# Patient Record
Sex: Female | Born: 1955 | Race: White | Hispanic: No | Smoking: Current every day smoker
Health system: Southern US, Community
[De-identification: ages and names within clinical notes are randomized; demographics above are authoritative.]

## PROBLEM LIST (undated history)

## (undated) DIAGNOSIS — Z87442 Personal history of urinary calculi: Secondary | ICD-10-CM

## (undated) DIAGNOSIS — E039 Hypothyroidism, unspecified: Secondary | ICD-10-CM

## (undated) DIAGNOSIS — I10 Essential (primary) hypertension: Secondary | ICD-10-CM

## (undated) DIAGNOSIS — E119 Type 2 diabetes mellitus without complications: Secondary | ICD-10-CM

## (undated) DIAGNOSIS — K219 Gastro-esophageal reflux disease without esophagitis: Secondary | ICD-10-CM

## (undated) DIAGNOSIS — F419 Anxiety disorder, unspecified: Secondary | ICD-10-CM

## (undated) DIAGNOSIS — E079 Disorder of thyroid, unspecified: Secondary | ICD-10-CM

## (undated) DIAGNOSIS — J449 Chronic obstructive pulmonary disease, unspecified: Secondary | ICD-10-CM

## (undated) HISTORY — PX: TONSILLECTOMY: SUR1361

## (undated) HISTORY — PX: HEMATOMA EVACUATION: SHX5118

## (undated) HISTORY — PX: FINGER SURGERY: SHX640

## (undated) HISTORY — PX: ELBOW SURGERY: SHX618

## (undated) HISTORY — PX: KNEE SURGERY: SHX244

---

## 2009-06-13 ENCOUNTER — Emergency Department (HOSPITAL_COMMUNITY): Admission: EM | Admit: 2009-06-13 | Discharge: 2009-06-13 | Payer: Self-pay | Admitting: Emergency Medicine

## 2010-07-28 ENCOUNTER — Emergency Department (HOSPITAL_COMMUNITY)
Admission: EM | Admit: 2010-07-28 | Discharge: 2010-07-28 | Payer: Self-pay | Source: Home / Self Care | Admitting: Emergency Medicine

## 2016-08-05 ENCOUNTER — Encounter (HOSPITAL_COMMUNITY): Payer: Self-pay | Admitting: Emergency Medicine

## 2016-08-05 ENCOUNTER — Emergency Department (HOSPITAL_COMMUNITY)
Admission: EM | Admit: 2016-08-05 | Discharge: 2016-08-05 | Disposition: A | Discharge status: Home / Self Care | Payer: Medicaid Other | Attending: Emergency Medicine | Admitting: Emergency Medicine

## 2016-08-05 DIAGNOSIS — R202 Paresthesia of skin: Secondary | ICD-10-CM | POA: Diagnosis present

## 2016-08-05 DIAGNOSIS — I10 Essential (primary) hypertension: Secondary | ICD-10-CM | POA: Insufficient documentation

## 2016-08-05 DIAGNOSIS — G5603 Carpal tunnel syndrome, bilateral upper limbs: Secondary | ICD-10-CM | POA: Diagnosis not present

## 2016-08-05 DIAGNOSIS — F17210 Nicotine dependence, cigarettes, uncomplicated: Secondary | ICD-10-CM | POA: Insufficient documentation

## 2016-08-05 HISTORY — DX: Disorder of thyroid, unspecified: E07.9

## 2016-08-05 HISTORY — DX: Essential (primary) hypertension: I10

## 2016-08-05 MED ORDER — IBUPROFEN 800 MG PO TABS: 800.0000 mg | ORAL_TABLET | Freq: Three times a day (TID) | ORAL | 0 refills | Status: DC

## 2016-08-05 MED ORDER — IBUPROFEN 800 MG PO TABS
800.0000 mg | ORAL_TABLET | Freq: Once | ORAL | Status: AC
  Administered 2016-08-05: 800 mg via ORAL
  Filled 2016-08-05: qty 1

## 2016-08-05 MED ORDER — OXYCODONE-ACETAMINOPHEN 5-325 MG PO TABS
1.0000 | ORAL_TABLET | Freq: Once | ORAL | Status: AC
  Administered 2016-08-05: 1 via ORAL
  Filled 2016-08-05: qty 1

## 2016-08-05 MED ORDER — OXYCODONE-ACETAMINOPHEN 5-325 MG PO TABS: 1.0000 | ORAL_TABLET | ORAL | 0 refills | Status: DC | PRN

## 2016-08-05 NOTE — Discharge Instructions (Signed)
Wear the brace day and night for 1-2 weeks.  Call the orthopedic doctor listed to arrange a follow-up appt if not improving

## 2016-08-05 NOTE — ED Provider Notes (Signed)
[facility]PT Provider Note   CSN: [id number] Arrival date & time: 08/05/16  1339     History   Chief Complaint Chief Complaint  Patient presents with  . Hand Problem    HPI Kristin AreaBonnie G Attia is a 61 y.o. female.  HPI   Kristin [name] is a 61 y.o. female who presents to the Emergency Department complaining of numbness, pain and tingling of the bilateral hands and forearms.  Describes pain to the forearms with tingling sensation to all her fingers except the pinkie.  Symptoms began 2 weeks ago.  Numbness and pain are constant and interfering with sleep.  She states she is unable to grip objects with either hand, but pain to the left is worse,  She has tried warm water and ibuprofen without relief.      Past Medical History:  Diagnosis Date  . Hypertension   . Thyroid disease     There are no active problems to display for this patient.   Past Surgical History:  Procedure Laterality Date  . CESAREAN SECTION    . ELBOW SURGERY     right  . FINGER SURGERY     left pinky finger  . HEMATOMA EVACUATION     left hip  . KNEE SURGERY     right  . TONSILLECTOMY      OB History    Gravida Para Term Preterm AB Living   3         3   SAB TAB Ectopic Multiple Live Births                   Home Medications    Prior to Admission medications   Medication Sig Start Date End Date Taking? Authorizing Provider  ibuprofen (ADVIL,MOTRIN) 800 MG tablet Take 1 tablet (800 mg total) by mouth 3 (three) times daily. 08/05/16   Latravis Grine, PA-C  oxyCODONE-acetaminophen (PERCOCET/ROXICET) 5-325 MG tablet Take 1 tablet by mouth every 4 (four) hours as needed. 08/05/16   Delayne Sanzo, PA-C    Family History History reviewed. No pertinent family history.  Social History Social History  Substance Use Topics  . Smoking status: Current Every Day Smoker    Types: Cigarettes  . Smokeless tobacco: Never Used  . Alcohol use 3.6 oz/week    6 Cans of beer per week      Allergies   Patient has no known allergies.   Review of Systems Review of Systems  Constitutional: Negative for chills and fever.  Cardiovascular: Negative for chest pain.  Genitourinary: Negative for difficulty urinating and dysuria.  Musculoskeletal: Positive for arthralgias (bilateral hand pain). Negative for joint swelling.  Skin: Negative for color change, rash and wound.  Neurological: Positive for numbness. Negative for weakness.  All other systems reviewed and are negative.    Physical Exam Updated Vital Signs BP 150/80 (BP Location: Left Arm)   Pulse 80   Temp 97.7 F (36.5 C) (Oral)   Resp 16   Ht 5\' 3"  (1.6 m)   Wt 57.2 kg   SpO2 94%   BMI 22.32 kg/m   Physical Exam  Constitutional: She is oriented to person, place, and time. She appears well-developed and well-nourished. No distress.  HENT:  Head: Normocephalic and atraumatic.  Neck: Normal range of motion.  Cardiovascular: Normal rate, regular rhythm and intact distal pulses.   Pulmonary/Chest: Effort normal and breath sounds normal.  Musculoskeletal: She exhibits tenderness. She exhibits no edema.   Positive tinel's sign bilateral  hands.  Gross sensation intact to distal fingers. Tenderness to bilateral wrist. No edema, erythema,  Radial pulse brisk bilaterally.    Lymphadenopathy:    She has no cervical adenopathy.  Neurological: She is alert and oriented to person, place, and time. She exhibits normal muscle tone. Coordination normal.  Skin: Skin is warm and dry.  Nursing note and vitals reviewed.    ED Treatments / Results  Labs (all labs ordered are listed, but only abnormal results are displayed) Labs Reviewed - No data to display  EKG  EKG Interpretation None       Radiology No results found.  Procedures Procedures (including critical care time)  Medications Ordered in ED Medications  oxyCODONE-acetaminophen (PERCOCET/ROXICET) 5-325 MG per tablet 1 tablet (1 tablet Oral  Given 08/05/16 1459)  ibuprofen (ADVIL,MOTRIN) tablet 800 mg (800 mg Oral Given 08/05/16 1459)     Initial Impression / Assessment and Plan / ED Course  I have reviewed the triage vital signs and the nursing notes.  Pertinent labs & imaging results that were available during my care of the patient were reviewed by me and considered in my medical decision making (see chart for details).     Pt with likely bilateral carpel tunnel syndrome.  Sx's localized to hands.    Bilateral wrist splints applied, pain improved.  Pt agrees to tx plan which includes splint, NSAID, short course of pain medication, and close ortho f/u.    Final Clinical Impressions(s) / ED Diagnoses   Final diagnoses:  Bilateral carpal tunnel syndrome    New Prescriptions Discharge Medication List as of 08/05/2016  3:12 PM    START taking these medications   Details  ibuprofen (ADVIL,MOTRIN) 800 MG tablet Take 1 tablet (800 mg total) by mouth 3 (three) times daily., Starting Wed 08/05/2016, Print    oxyCODONE-acetaminophen (PERCOCET/ROXICET) 5-325 MG tablet Take 1 tablet by mouth every 4 (four) hours as needed., Starting Wed 08/05/2016, Print         Jaleil Renwick Mosquero, PA-C 08/07/16 2131    Marily Memos, MD 08/09/16 1210

## 2016-08-05 NOTE — ED Triage Notes (Signed)
C/o tingling to both hands and finger, rates pain 7/10.  Pt is able to move finger.

## 2016-08-31 ENCOUNTER — Encounter: Payer: Self-pay | Admitting: Orthopedic Surgery

## 2016-08-31 ENCOUNTER — Ambulatory Visit (INDEPENDENT_AMBULATORY_CARE_PROVIDER_SITE_OTHER): Payer: Medicaid Other | Admitting: Orthopedic Surgery

## 2016-08-31 VITALS — BP 148/86 | HR 72 | Ht 64.0 in | Wt 129.0 lb

## 2016-08-31 DIAGNOSIS — G5603 Carpal tunnel syndrome, bilateral upper limbs: Secondary | ICD-10-CM | POA: Diagnosis not present

## 2016-08-31 MED ORDER — GABAPENTIN 100 MG PO CAPS: 100.0000 mg | ORAL_CAPSULE | Freq: Every day | ORAL | 0 refills | Status: DC

## 2016-08-31 NOTE — Progress Notes (Signed)
Patient ID: Kristin [name], female   DOB: April 13, 1956, 61 y.o.   MRN: [id number]  Chief Complaint  Patient presents with  . Hand Problem    bilateral hands numb and tingling    HPI Kristin [name] is a 61 y.o. female.   61 year old female presents to [place] after going to the emergency room complaining of several month history of numbness and tingling in both hands with weakness and loss of grip. She has a history of thyroid disease she takes thyroid medication she also is a smoker and drinks about a sixpack per week. This is painful for her in the wrist [name] and she notices inability to hold onto things .  She has worn splints pretty much full-time for the last 4 weeks    Review of Systems Review of Systems  Constitutional: Negative for fever.  Respiratory: Positive for cough.   Cardiovascular: Negative for chest pain.  Musculoskeletal: Positive for arthralgias.  Neurological: Positive for weakness and numbness.     Past Medical History:  Diagnosis Date  . Hypertension   . Thyroid disease     Past Surgical History:  Procedure Laterality Date  . CESAREAN SECTION    . ELBOW SURGERY     right  . FINGER SURGERY     left pinky finger  . HEMATOMA EVACUATION     left hip  . KNEE SURGERY     right  . TONSILLECTOMY      No family history on file.  Social History Social History  Substance Use Topics  . Smoking status: Current Every Day Smoker    Types: Cigarettes  . Smokeless tobacco: Never Used  . Alcohol use 3.6 oz/week    6 Cans of beer per week    No Known Allergies  Current Outpatient Prescriptions  Medication Sig Dispense Refill  . levothyroxine (SYNTHROID, LEVOTHROID) 125 MCG tablet Take 125 mcg by mouth daily before breakfast.    . omeprazole (PRILOSEC) 20 MG capsule Take 20 mg by mouth daily.    Marland Kitchen tiZANidine (ZANAFLEX) 4 MG tablet Take 4 mg by mouth every 6 (six) hours as needed for muscle spasms.    Marland Kitchen topiramate (TOPAMAX) 50 MG tablet Take 50 mg by mouth  2 (two) times daily.    . traMADol (ULTRAM) 50 MG tablet Take by mouth every 6 (six) hours as needed.    . venlafaxine XR (EFFEXOR-XR) 150 MG 24 hr capsule Take 150 mg by mouth daily with breakfast.    . venlafaxine XR (EFFEXOR-XR) 75 MG 24 hr capsule Take 75 mg by mouth daily with breakfast.     No current facility-administered medications for this visit.      Physical Exam Blood pressure (!) 148/86, pulse 72, height 5\' 4"  (1.626 m), weight 129 lb (58.5 kg). Physical Exam The patient is well developed well nourished and well groomed.  Orientation to person place and time is normal  Mood is pleasant.  Ambulatory status normal   Right and left  Upper extremity examination reveals the following:  Inspection reveals no swelling. There is tenderness over the carpal tunnel.  Range of motion of the wrist Right side decreased flexion normal on the left and right and left elbow are normal  Motor exam shows mild weakness with grip strength in the right and left hand  Right and left Wrist joint is stable    Provocative tests for carpal tunnel Phalen's test  normal Carpal tunnel compression test positive Tinel's normal  Pulses are  normal in the radial and ulnar artery with a normal Allen's test.  Decreased sensation is noted in the median nerve distribution on the right and left hand. Soft touch is normal. Right and left hand    Data Reviewed  independent image interpretation :  Emergency room records were reviewed they confirm the history and physical exam consistent with bilateral carpal tunnel syndrome institution of ibuprofen and splinting  Assessment    Encounter Diagnosis  Name Primary?  . Bilateral carpal tunnel syndrome Yes       Plan    Right and left Wrist splint Gabapentin  F/U 4 weeks  We discussed possibility of a need for surgery if she does not improve

## 2016-09-28 ENCOUNTER — Telehealth: Payer: Self-pay | Admitting: Orthopedic Surgery

## 2016-09-28 ENCOUNTER — Ambulatory Visit: Payer: Medicaid Other | Admitting: Orthopedic Surgery

## 2016-09-28 ENCOUNTER — Other Ambulatory Visit: Payer: Self-pay | Admitting: *Deleted

## 2016-09-28 MED ORDER — GABAPENTIN 100 MG PO CAPS: 100.0000 mg | ORAL_CAPSULE | Freq: Every day | ORAL | 0 refills | Status: DC

## 2016-09-28 NOTE — Telephone Encounter (Signed)
Approve

## 2016-09-28 NOTE — Telephone Encounter (Signed)
Gabapentin (NEURONTIN) 100 MG Qty 30 Capsules   Take 1 capsule (100 mg total) by mouth at bedtime.

## 2016-09-28 NOTE — Telephone Encounter (Signed)
ROUTING TO DR HARRISON 

## 2016-10-19 ENCOUNTER — Ambulatory Visit (INDEPENDENT_AMBULATORY_CARE_PROVIDER_SITE_OTHER): Payer: Medicaid Other | Admitting: Orthopedic Surgery

## 2016-10-19 ENCOUNTER — Encounter: Payer: Self-pay | Admitting: Orthopedic Surgery

## 2016-10-19 DIAGNOSIS — G5603 Carpal tunnel syndrome, bilateral upper limbs: Secondary | ICD-10-CM | POA: Diagnosis not present

## 2016-10-19 NOTE — Addendum Note (Signed)
Addended by: Adella Hare B on: 10/19/2016 10:42 AM   Modules accepted: Orders, SmartSet

## 2016-10-19 NOTE — Progress Notes (Addendum)
FOLLOW UP VISIT   Patient ID: Kristin [name], female   DOB: [birth year], 61 y.o.   MRN: [id number]  Chief Complaint  Patient presents with  . Follow-up    bilateral carpal tunnel    HPI Kristin [name] is a 61 y.o. female.   HPI   61 years old treated with gabapentin and other nonoperative measures for bilateral carpal tunnel syndrome she did not improve  She has a cyst on the radial side of the volar aspect of the right wrist along with the carpal tunnel syndrome and complains of pain there as well   Kristin [name] is a 61 y.o. female.   61 year old female presents to [place] after going to the emergency room complaining of several month history of numbness and tingling in both hands with weakness and loss of grip. She has a history of thyroid disease she takes thyroid medication she also is a smoker and drinks about a sixpack per week. This is painful for her in the wrist area and she notices inability to hold onto things .   She has worn splints pretty much full-time for the last 4 weeks  Review of Systems Review of Systems  Constitutional: Negative for fever.  Skin: Negative for color change and rash.    Past Medical History:  Diagnosis Date  . Hypertension   . Thyroid disease     Social History  Substance Use Topics  . Smoking status: Current Every Day Smoker    Types: Cigarettes  . Smokeless tobacco: Never Used  . Alcohol use 3.6 oz/week    6 Cans of beer per week    Physical Exam  Gen. appearance is normal frame is small nutritional status looks fine no grooming abnormalities  She has normal Allen's test on the right and left with good radial pulse bilaterally.  On the right side we see a volar ganglion over the right radial artery with no palpation no abnormalities radial artery palpates normally. Range of motion remains normal. Stability of the wrist is normal. The ulna dosing prominent compared to the left. Her muscle strength and tone show no gross  weakness or abnormality the skin is warm dry and intact without nodularity and the sensory exam shows decreased sensation in the median nerve distribution  On the left side we see normal skin decreased sensation in the median nerve normal muscle strength and tone wrist joint stability normal range of motion and no tenderness in the wrist or hand  She is alert and oriented 3 mood and affect are normal  Good pulses bilaterally  MEDICAL DECISION MAKING  DATA   Encounter Diagnosis  Name Primary?  . Bilateral carpal tunnel syndrome Yes     PLAN(RISK)   Recommend bilateral carpal tunnel releases starting with the left  3-4 weeks later we can do the right with the volar ganglion excision and we will need x-ray prior to surgery for that.

## 2016-10-22 NOTE — Patient Instructions (Signed)
Kristin [name]  10/22/2016     @   Your procedure is scheduled on  10/28/2016   Report to [facility] at  940   A.M.  Call this number if you have problems the morning of surgery:  [phone]   Remember:  Do not eat food or drink liquids after midnight.  Take these medicines the morning of surgery with A SIP OF WATER  Levothyroxine, lisinopril, prilosec, effexor.   Do not wear jewelry, make-up or nail polish.  Do not wear lotions, powders, or perfumes, or deoderant.  Do not shave 48 hours prior to surgery.  Men may shave face and neck.  Do not bring valuables to the hospital.  [facility] is not responsible for any belongings or valuables.  Contacts, dentures or bridgework may not be worn into surgery.  Leave your suitcase in the car.  After surgery it may be brought to your room.  For patients admitted to the hospital, discharge time will be determined by your treatment team.  Patients discharged the day of surgery will not be allowed to drive home.   Name and phone number of your driver:   family Special instructions:  None  Please read over the following fact sheets that you were given. Anesthesia Post-op Instructions and Care and Recovery After Surgery       Carpal Tunnel Release Carpal tunnel release is a surgical procedure to relieve numbness and pain in your hand that are caused by carpal tunnel syndrome. Your carpal tunnel is a narrow, hollow space in your wrist. It passes between your wrist bones and a band of connective tissue (transverse carpal ligament). The nerve that supplies most of your hand (median nerve) passes through this space, and so do the connections between your fingers and the muscles of your arm (tendons). Carpal tunnel syndrome makes this space swell and become narrow, and this causes pain and numbness. In carpal tunnel release surgery, a surgeon cuts through the transverse carpal ligament to make  more room in the carpal tunnel space. You may have this surgery if other types of treatment have not worked. Tell a health care provider about:  Any allergies you have.  All medicines you are taking, including vitamins, herbs, eye drops, creams, and over-the-counter medicines.  Any problems you or family members have had with anesthetic medicines.  Any blood disorders you have.  Any surgeries you have had.  Any medical conditions you have. What are the risks? Generally, this is a safe procedure. However, problems may occur, including:  Bleeding.  Infection.  Injury to the median nerve.  Need for additional surgery. What happens before the procedure?  Ask your health care provider about:  Changing or stopping your regular medicines. This is especially important if you are taking diabetes medicines or blood thinners.  Taking medicines such as aspirin and ibuprofen. These medicines can thin your blood. Do not take these medicines before your procedure if your health care provider instructs you not to.  Do not eat or drink anything after midnight on the night before the procedure or as directed by your health care provider.  Plan to have someone take you home after the procedure. What happens during the procedure?  An IV tube may be inserted into a vein.  You will be given one of the following:  A medicine that numbs the wrist area (local anesthetic). You may also be  given a medicine to make you relax (sedative).  A medicine that makes you go to sleep (general anesthetic).  Your arm, hand, and wrist will be cleaned with a germ-killing solution (antiseptic).  Your surgeon will make a surgical cut (incision) over the palm side of your wrist. The surgeon will pull aside the skin of your wrist to expose the carpal tunnel space.  The surgeon will cut the transverse carpal ligament.  The edges of the incision will be closed with stitches (sutures) or staples.  A bandage  (dressing) will be placed over your wrist and wrapped around your hand and wrist. What happens after the procedure?  You may spend some time in a recovery area.  Your blood pressure, heart rate, breathing rate, and blood oxygen level will be monitored often until the medicines you were given have worn off.  You will likely have some pain. You will be given pain medicine.  You may need to wear a splint or a wrist brace over your dressing. This information is not intended to replace advice given to you by your health care provider. Make sure you discuss any questions you have with your health care provider. Document Released: 09/12/2003 Document Revised: 11/28/2015 Document Reviewed: 02/07/2014 Elsevier Interactive Patient Education  2017 [employer] Carpal Tunnel Release, Care After Refer to this sheet in the next few weeks. These instructions provide you with information about caring for yourself after your procedure. Your health care provider may also give you more specific instructions. Your treatment has been planned according to current medical practices, but problems sometimes occur. Call your health care provider if you have any problems or questions after your procedure. What can I expect after the procedure? After your procedure, it is typical to have the following:  Pain.  Numbness.  Tingling.  Swelling.  Stiffness.  Bruising. Follow these instructions at home:  Take medicines only as directed by your health care provider.  There are many different ways to close and cover an incision, including stitches (sutures), skin glue, and adhesive strips. Follow your health care provider's instructions about:  Incision care.  Bandage (dressing) changes and removal.  Incision closure removal.  Wear a splint or a brace as directed by your surgeon. You may need to do this for 2-3 weeks.  Keep your hand raised (elevated) above the level of your heart while you are resting.  Move your fingers often.  Avoid activities that cause hand pain.  Ask your surgeon when you can start to do all of your usual activities again, such as:  Driving.  Returning to work.  Bathing and swimming.  Keep all follow-up visits as directed by your health care provider. This is important. You may need physical therapy for several months to speed healing and regain movement. Contact a health care provider if:  You have drainage, redness, swelling, or pain at your incision site.  You have a fever.  You have chills.  Your pain medicine is not working.  Your symptoms do not go away after 2 months.  Your symptoms go away and then return. Get help right away if:  You have pain or numbness that is getting worse.  Your fingers change color.  You are not able to move your fingers. This information is not intended to replace advice given to you by your health care provider. Make sure you discuss any questions you have with your health care provider. Document Released: 01/09/2005 Document Revised: 11/28/2015 Document Reviewed: 02/07/2014 Elsevier Interactive Patient  Education  2017 [employer]  General Anesthesia, Adult General anesthesia is the use of medicines to make a person "go to sleep" (be unconscious) for a medical procedure. General anesthesia is often recommended when a procedure:  Is long.  Requires you to be still or in an unusual position.  Is major and can cause you to lose blood.  Is impossible to do without general anesthesia. The medicines used for general anesthesia are called general anesthetics. In addition to making you sleep, the medicines:  Prevent pain.  Control your blood pressure.  Relax your muscles. Tell a health care provider about:  Any allergies you have.  All medicines you are taking, including vitamins, herbs, eye drops, creams, and over-the-counter medicines.  Any problems you or family members have had with anesthetic  medicines.  Types of anesthetics you have had in the past.  Any bleeding disorders you have.  Any surgeries you have had.  Any medical conditions you have.  Any history of heart or lung conditions, such as heart failure, sleep apnea, or chronic obstructive pulmonary disease (COPD).  Whether you are pregnant or may be pregnant.  Whether you use tobacco, alcohol, marijuana, or street drugs.  Any history of [employer].  Any history of depression or anxiety. What are the risks? Generally, this is a safe procedure. However, problems may occur, including:  Allergic reaction to anesthetics.  Lung and heart problems.  Inhaling food or liquids from your stomach into your lungs (aspiration).  Injury to nerves.  Waking up during your procedure and being unable to move (rare).  Extreme agitation or a state of mental confusion (delirium) when you wake up from the anesthetic.  Air in the bloodstream, which can lead to stroke. These problems are more likely to develop if you are having a major surgery or if you have an advanced medical condition. You can prevent some of these complications by answering all of your health care provider's questions thoroughly and by following all pre-procedure instructions. General anesthesia can cause side effects, including:  Nausea or vomiting  A sore throat from the breathing tube.  Feeling cold or shivery.  Feeling tired, washed out, or achy.  Sleepiness or drowsiness.  Confusion or agitation. What happens before the procedure? Staying hydrated  Follow instructions from your health care provider about hydration, which may include:  Up to 2 hours before the procedure - you may continue to drink clear liquids, such as water, clear fruit juice, black coffee, and plain tea. Eating and drinking restrictions  Follow instructions from your health care provider about eating and drinking, which may include:  8 hours before the procedure -  stop eating heavy meals or foods such as meat, fried foods, or fatty foods.  6 hours before the procedure - stop eating light meals or foods, such as toast or cereal.  6 hours before the procedure - stop drinking milk or drinks that contain milk.  2 hours before the procedure - stop drinking clear liquids. Medicines   Ask your health care provider about:  Changing or stopping your regular medicines. This is especially important if you are taking diabetes medicines or blood thinners.  Taking medicines such as aspirin and ibuprofen. These medicines can thin your blood. Do not take these medicines before your procedure if your health care provider instructs you not to.  Taking new dietary supplements or medicines. Do not take these during the week before your procedure unless your health care provider approves them.  If  you are told to take a medicine or to continue taking a medicine on the day of the procedure, take the medicine with sips of water. General instructions    Ask if you will be going home the same day, the following day, or after a longer hospital stay.  Plan to have someone take you home.  Plan to have someone stay with you for the first 24 hours after you leave the hospital or clinic.  For 3-6 weeks before the procedure, try not to use any tobacco products, such as cigarettes, chewing tobacco, and e-cigarettes.  You may brush your teeth on the morning of the procedure, but make sure to spit out the toothpaste. What happens during the procedure?  You will be given anesthetics through a mask and through an IV tube in one of your veins.  You may receive medicine to help you relax (sedative).  As soon as you are asleep, a breathing tube may be used to help you breathe.  An anesthesia specialist will stay with you throughout the procedure. He or she will help keep you comfortable and safe by continuing to give you medicines and adjusting the amount of medicine that you  get. He or she will also watch your blood pressure, pulse, and oxygen levels to make sure that the anesthetics do not cause any problems.  If a breathing tube was used to help you breathe, it will be removed before you wake up. The procedure may vary among health care providers and hospitals. What happens after the procedure?  You will wake up, often slowly, after the procedure is complete, usually in a recovery area.  Your blood pressure, heart rate, breathing rate, and blood oxygen level will be monitored until the medicines you were given have worn off.  You may be given medicine to help you calm down if you feel anxious or agitated.  If you will be going home the same day, your health care provider may check to make sure you can stand, drink, and urinate.  Your health care providers will treat your pain and side effects before you go home.  Do not drive for 24 hours if you received a sedative.  You may:  Feel nauseous and vomit.  Have a sore throat.  Have mental slowness.  Feel cold or shivery.  Feel sleepy.  Feel tired.  Feel sore or achy, even in parts of your body where you did not have surgery. This information is not intended to replace advice given to you by your health care provider. Make sure you discuss any questions you have with your health care provider. Document Released: 09/29/2007 Document Revised: 12/03/2015 Document Reviewed: 06/06/2015 Elsevier Interactive Patient Education  2017 [employer]. General Anesthesia, Adult, Care After These instructions provide you with information about caring for yourself after your procedure. Your health care provider may also give you more specific instructions. Your treatment has been planned according to current medical practices, but problems sometimes occur. Call your health care provider if you have any problems or questions after your procedure. What can I expect after the procedure? After the procedure, it is  common to have:  Vomiting.  A sore throat.  Mental slowness. It is common to feel:  Nauseous.  Cold or shivery.  Sleepy.  Tired.  Sore or achy, even in parts of your body where you did not have surgery. Follow these instructions at home: For at least 24 hours after the procedure:   Do not:  Participate  in activities where you could fall or become injured.  Drive.  Use heavy machinery.  Drink alcohol.  Take sleeping pills or medicines that cause drowsiness.  Make important decisions or sign legal documents.  Take care of children on your own.  Rest. Eating and drinking   If you vomit, drink water, juice, or soup when you can drink without vomiting.  Drink enough fluid to keep your urine clear or pale yellow.  Make sure you have little or no nausea before eating solid foods.  Follow the diet recommended by your health care provider. General instructions   Have a responsible adult stay with you until you are awake and alert.  Return to your normal activities as told by your health care provider. Ask your health care provider what activities are safe for you.  Take over-the-counter and prescription medicines only as told by your health care provider.  If you smoke, do not smoke without supervision.  Keep all follow-up visits as told by your health care provider. This is important. Contact a health care provider if:  You continue to have nausea or vomiting at home, and medicines are not helpful.  You cannot drink fluids or start eating again.  You cannot urinate after 8-12 hours.  You develop a skin rash.  You have fever.  You have increasing redness at the site of your procedure. Get help right away if:  You have difficulty breathing.  You have chest pain.  You have unexpected bleeding.  You feel that you are having a life-threatening or urgent problem. This information is not intended to replace advice given to you by your health care  provider. Make sure you discuss any questions you have with your health care provider. Document Released: 09/28/2000 Document Revised: 11/25/2015 Document Reviewed: 06/06/2015 Elsevier Interactive Patient Education  2017 [employer] Inc.  Monitored Anesthesia Care Anesthesia is a term that refers to techniques, procedures, and medicines that help a person stay safe and comfortable during a medical procedure. Monitored anesthesia care, or sedation, is one type of anesthesia. Your anesthesia specialist may recommend sedation if you will be having a procedure that does not require you to be unconscious, such as:  Cataract surgery.  A dental procedure.  A biopsy.  A colonoscopy. During the procedure, you may receive a medicine to help you relax (sedative). There are three levels of sedation:  Mild sedation. At this level, you may feel awake and relaxed. You will be able to follow directions.  Moderate sedation. At this level, you will be sleepy. You may not remember the procedure.  Deep sedation. At this level, you will be asleep. You will not remember the procedure. The more medicine you are given, the deeper your level of sedation will be. Depending on how you respond to the procedure, the anesthesia specialist may change your level of sedation or the type of anesthesia to fit your needs. An anesthesia specialist will monitor you closely during the procedure. Let your health care provider know about:  Any allergies you have.  All medicines you are taking, including vitamins, herbs, eye drops, creams, and over-the-counter medicines.  Any use of steroids (by mouth or as a cream).  Any problems you or family members have had with sedatives and anesthetic medicines.  Any blood disorders you have.  Any surgeries you have had.  Any medical conditions you have, such as sleep apnea.  Whether you are pregnant or may be pregnant.  Any use of cigarettes, alcohol, or street drugs.  What are  the risks? Generally, this is a safe procedure. However, problems may occur, including:  Getting too much medicine (oversedation).  Nausea.  Allergic reaction to medicines.  Trouble breathing. If this happens, a breathing tube may be used to help with breathing. It will be removed when you are awake and breathing on your own.  Heart trouble.  Lung trouble. Before the procedure Staying hydrated  Follow instructions from your health care provider about hydration, which may include:  Up to 2 hours before the procedure - you may continue to drink clear liquids, such as water, clear fruit juice, black coffee, and plain tea. Eating and drinking restrictions  Follow instructions from your health care provider about eating and drinking, which may include:  8 hours before the procedure - stop eating heavy meals or foods such as meat, fried foods, or fatty foods.  6 hours before the procedure - stop eating light meals or foods, such as toast or cereal.  6 hours before the procedure - stop drinking milk or drinks that contain milk.  2 hours before the procedure - stop drinking clear liquids. Medicines  Ask your health care provider about:  Changing or stopping your regular medicines. This is especially important if you are taking diabetes medicines or blood thinners.  Taking medicines such as aspirin and ibuprofen. These medicines can thin your blood. Do not take these medicines before your procedure if your health care provider instructs you not to. Tests and exams  You will have a physical exam.  You may have blood tests done to show:  How well your kidneys and liver are working.  How well your blood can clot.  General instructions  Plan to have someone take you home from the hospital or clinic.  If you will be going home right after the procedure, plan to have someone with you for 24 hours. What happens during the procedure?  Your blood pressure, heart rate, breathing,  level of pain and overall condition will be monitored.  An IV tube will be inserted into one of your veins.  Your anesthesia specialist will give you medicines as needed to keep you comfortable during the procedure. This may mean changing the level of sedation.  The procedure will be performed. After the procedure  Your blood pressure, heart rate, breathing rate, and blood oxygen level will be monitored until the medicines you were given have worn off.  Do not drive for 24 hours if you received a sedative.  You may:  Feel sleepy, clumsy, or nauseous.  Feel forgetful about what happened after the procedure.  Have a sore throat if you had a breathing tube during the procedure.  Vomit. This information is not intended to replace advice given to you by your health care provider. Make sure you discuss any questions you have with your health care provider. Document Released: 03/18/2005 Document Revised: 11/29/2015 Document Reviewed: 10/13/2015 Elsevier Interactive Patient Education  2017 [employer]. Monitored Anesthesia Care, Care After These instructions provide you with information about caring for yourself after your procedure. Your health care provider may also give you more specific instructions. Your treatment has been planned according to current medical practices, but problems sometimes occur. Call your health care provider if you have any problems or questions after your procedure. What can I expect after the procedure? After your procedure, it is common to:  Feel sleepy for several hours.  Feel clumsy and have poor balance for several hours.  Feel forgetful about what  happened after the procedure.  Have poor judgment for several hours.  Feel nauseous or vomit.  Have a sore throat if you had a breathing tube during the procedure. Follow these instructions at home: For at least 24 hours after the procedure:    Do not:  Participate in activities in which you could  fall or become injured.  Drive.  Use heavy machinery.  Drink alcohol.  Take sleeping pills or medicines that cause drowsiness.  Make important decisions or sign legal documents.  Take care of children on your own.  Rest. Eating and drinking   Follow the diet that is recommended by your health care provider.  If you vomit, drink water, juice, or soup when you can drink without vomiting.  Make sure you have little or no nausea before eating solid foods. General instructions   Have a responsible adult stay with you until you are awake and alert.  Take over-the-counter and prescription medicines only as told by your health care provider.  If you smoke, do not smoke without supervision.  Keep all follow-up visits as told by your health care provider. This is important. Contact a health care provider if:  You keep feeling nauseous or you keep vomiting.  You feel light-headed.  You develop a rash.  You have a fever. Get help right away if:  You have trouble breathing. This information is not intended to replace advice given to you by your health care provider. Make sure you discuss any questions you have with your health care provider. Document Released: 10/13/2015 Document Revised: 02/12/2016 Document Reviewed: 10/13/2015 Elsevier Interactive Patient Education  2017 [employer].

## 2016-10-26 ENCOUNTER — Encounter (HOSPITAL_COMMUNITY)
Admission: RE | Admit: 2016-10-26 | Discharge: 2016-10-26 | Disposition: A | Discharge status: Home / Self Care | Payer: Medicaid Other | Source: Ambulatory Visit | Attending: Orthopedic Surgery | Admitting: Orthopedic Surgery

## 2016-10-26 ENCOUNTER — Other Ambulatory Visit: Payer: Self-pay

## 2016-10-26 ENCOUNTER — Encounter (HOSPITAL_COMMUNITY): Payer: Self-pay

## 2016-10-26 DIAGNOSIS — Z01812 Encounter for preprocedural laboratory examination: Secondary | ICD-10-CM | POA: Diagnosis present

## 2016-10-26 DIAGNOSIS — G5602 Carpal tunnel syndrome, left upper limb: Secondary | ICD-10-CM | POA: Diagnosis not present

## 2016-10-26 HISTORY — DX: Gastro-esophageal reflux disease without esophagitis: K21.9

## 2016-10-26 HISTORY — DX: Hypothyroidism, unspecified: E03.9

## 2016-10-26 HISTORY — DX: Anxiety disorder, unspecified: F41.9

## 2016-10-26 HISTORY — DX: Personal history of urinary calculi: Z87.442

## 2016-10-26 LAB — BASIC METABOLIC PANEL
ANION GAP: 5 (ref 5–15)
BUN: 19 mg/dL (ref 6–20)
CHLORIDE: 104 mmol/L (ref 101–111)
CO2: 26 mmol/L (ref 22–32)
Calcium: 8.5 mg/dL — ABNORMAL LOW (ref 8.9–10.3)
Creatinine, Ser: 0.91 mg/dL (ref 0.44–1.00)
GFR calc Af Amer: >60 mL/min (ref >60)
GLUCOSE: 88 mg/dL (ref 65–99)
POTASSIUM: 4.3 mmol/L (ref 3.5–5.1)
Sodium: 135 mmol/L (ref 135–145)

## 2016-10-26 LAB — CBC WITH DIFFERENTIAL/PLATELET
BASOS ABS: 0 10*3/uL (ref 0.0–0.1)
Basophils Relative: 0 %
EOS PCT: 3 %
Eosinophils Absolute: 0.2 10*3/uL (ref 0.0–0.7)
HEMATOCRIT: 38.2 % (ref 36.0–46.0)
HEMOGLOBIN: 12.9 g/dL (ref 12.0–15.0)
LYMPHS ABS: 2.2 10*3/uL (ref 0.7–4.0)
LYMPHS PCT: 32 %
MCH: 35.1 pg — AB (ref 26.0–34.0)
MCHC: 33.8 g/dL (ref 30.0–36.0)
MCV: 104.1 fL — AB (ref 78.0–100.0)
Monocytes Absolute: 0.5 10*3/uL (ref 0.1–1.0)
Monocytes Relative: 7 %
NEUTROS ABS: 4 10*3/uL (ref 1.7–7.7)
NEUTROS PCT: 58 %
PLATELETS: 234 10*3/uL (ref 150–400)
RBC: 3.67 MIL/uL — AB (ref 3.87–5.11)
RDW: 12.7 % (ref 11.5–15.5)
WBC: 6.9 10*3/uL (ref 4.0–10.5)

## 2016-10-27 NOTE — H&P (Signed)
61 years old treated with gabapentin and other nonoperative measures for bilateral carpal tunnel syndrome she did not improve   She has a cyst on the radial side of the volar aspect of the right wrist along with the carpal tunnel syndrome and complains of pain there as well     Kristin [name] is a 61 y.o. female.   61 year old female presents to [place] after going to the emergency room complaining of several month history of numbness and tingling in both hands with weakness and loss of grip. She has a history of thyroid disease she takes thyroid medication she also is a smoker and drinks about a sixpack per week. This is painful for her in the wrist area and she notices inability to hold onto things .   She has worn splints pretty much full-time for the last 4 weeks   Review of Systems Review of Systems  Constitutional: Negative for fever.  Skin: Negative for color change and rash.          Past Medical History:  Diagnosis Date  . Hypertension    . Thyroid disease              Social History  Substance Use Topics  . Smoking status: Current Every Day Smoker      Types: Cigarettes  . Smokeless tobacco: Never Used  . Alcohol use 3.6 oz/week       6 Cans of beer per week       Physical Exam   Gen. appearance is normal frame is small nutritional status looks fine no grooming abnormalities   She has normal Allen's test on the right and left with good radial pulse bilaterally.   On the right side we see a volar ganglion over the right radial artery with no palpation no abnormalities radial artery palpates normally. Range of motion remains normal. Stability of the wrist is normal. The ulna dosing prominent compared to the left. Her muscle strength and tone show no gross weakness or abnormality the skin is warm dry and intact without nodularity and the sensory exam shows decreased sensation in the median nerve distribution   On the left side we see normal skin decreased sensation in  the median nerve normal muscle strength and tone wrist joint stability normal range of motion and no tenderness in the wrist or hand   She is alert and oriented 3 mood and affect are normal   Good pulses bilaterally   MEDICAL DECISION MAKING   DATA        Encounter Diagnosis  Name Primary?  . Bilateral carpal tunnel syndrome Yes        PLAN(RISK)    She will have a left carpal tunnel release for left carpal tunnel syndrome

## 2016-10-28 ENCOUNTER — Ambulatory Visit (HOSPITAL_COMMUNITY): Payer: Medicaid Other | Admitting: Anesthesiology

## 2016-10-28 ENCOUNTER — Ambulatory Visit (HOSPITAL_COMMUNITY)
Admission: RE | Admit: 2016-10-28 | Discharge: 2016-10-28 | Disposition: A | Discharge status: Home / Self Care | Payer: Medicaid Other | Source: Ambulatory Visit | Attending: Orthopedic Surgery | Admitting: Orthopedic Surgery

## 2016-10-28 ENCOUNTER — Encounter (HOSPITAL_COMMUNITY): Payer: Self-pay | Admitting: Anesthesiology

## 2016-10-28 ENCOUNTER — Encounter (HOSPITAL_COMMUNITY)
Admission: RE | Disposition: A | Discharge status: Home / Self Care | Payer: Self-pay | Source: Ambulatory Visit | Attending: Orthopedic Surgery

## 2016-10-28 DIAGNOSIS — F17210 Nicotine dependence, cigarettes, uncomplicated: Secondary | ICD-10-CM | POA: Diagnosis not present

## 2016-10-28 DIAGNOSIS — K219 Gastro-esophageal reflux disease without esophagitis: Secondary | ICD-10-CM | POA: Insufficient documentation

## 2016-10-28 DIAGNOSIS — E039 Hypothyroidism, unspecified: Secondary | ICD-10-CM | POA: Diagnosis not present

## 2016-10-28 DIAGNOSIS — G5602 Carpal tunnel syndrome, left upper limb: Secondary | ICD-10-CM | POA: Diagnosis not present

## 2016-10-28 DIAGNOSIS — I10 Essential (primary) hypertension: Secondary | ICD-10-CM | POA: Insufficient documentation

## 2016-10-28 DIAGNOSIS — Z79899 Other long term (current) drug therapy: Secondary | ICD-10-CM | POA: Insufficient documentation

## 2016-10-28 DIAGNOSIS — F419 Anxiety disorder, unspecified: Secondary | ICD-10-CM | POA: Insufficient documentation

## 2016-10-28 HISTORY — PX: CARPAL TUNNEL RELEASE: SHX101

## 2016-10-28 SURGERY — CARPAL TUNNEL RELEASE
Anesthesia: Regional | Laterality: Left

## 2016-10-28 MED ORDER — MIDAZOLAM HCL 2 MG/2ML IJ SOLN
INTRAMUSCULAR | Status: AC
  Filled 2016-10-28: qty 2

## 2016-10-28 MED ORDER — FENTANYL CITRATE (PF) 100 MCG/2ML IJ SOLN
INTRAMUSCULAR | Status: DC | PRN
  Administered 2016-10-28: 25 ug via INTRAVENOUS

## 2016-10-28 MED ORDER — FENTANYL CITRATE (PF) 100 MCG/2ML IJ SOLN
INTRAMUSCULAR | Status: AC
  Filled 2016-10-28: qty 2

## 2016-10-28 MED ORDER — SODIUM CHLORIDE 0.9% FLUSH
INTRAVENOUS | Status: AC
Start: 2016-10-28 — End: 2016-10-28
  Filled 2016-10-28: qty 10

## 2016-10-28 MED ORDER — 0.9 % SODIUM CHLORIDE (POUR BTL) OPTIME
TOPICAL | Status: DC | PRN
  Administered 2016-10-28: 1000 mL

## 2016-10-28 MED ORDER — CEFAZOLIN SODIUM-DEXTROSE 2-4 GM/100ML-% IV SOLN
2.0000 g | INTRAVENOUS | Status: AC
  Administered 2016-10-28: 2 g via INTRAVENOUS

## 2016-10-28 MED ORDER — BUPIVACAINE HCL (PF) 0.5 % IJ SOLN
INTRAMUSCULAR | Status: DC | PRN
  Administered 2016-10-28: 10 mL

## 2016-10-28 MED ORDER — LACTATED RINGERS IV SOLN
INTRAVENOUS | Status: DC
  Administered 2016-10-28 (×2): via INTRAVENOUS

## 2016-10-28 MED ORDER — PROPOFOL 500 MG/50ML IV EMUL
INTRAVENOUS | Status: DC | PRN
  Administered 2016-10-28: 50 ug/kg/min via INTRAVENOUS

## 2016-10-28 MED ORDER — BUPIVACAINE HCL (PF) 0.5 % IJ SOLN
INTRAMUSCULAR | Status: AC
  Filled 2016-10-28: qty 30

## 2016-10-28 MED ORDER — LIDOCAINE HCL (PF) 0.5 % IJ SOLN
INTRAMUSCULAR | Status: AC
  Filled 2016-10-28: qty 50

## 2016-10-28 MED ORDER — PROPOFOL 10 MG/ML IV BOLUS
INTRAVENOUS | Status: AC
  Filled 2016-10-28: qty 20

## 2016-10-28 MED ORDER — MIDAZOLAM HCL 5 MG/5ML IJ SOLN
INTRAMUSCULAR | Status: DC | PRN
  Administered 2016-10-28: 2 mg via INTRAVENOUS

## 2016-10-28 MED ORDER — CEFAZOLIN SODIUM-DEXTROSE 2-4 GM/100ML-% IV SOLN
INTRAVENOUS | Status: AC
  Filled 2016-10-28: qty 100

## 2016-10-28 MED ORDER — HYDROCODONE-ACETAMINOPHEN 5-325 MG PO TABS: 1.0000 | ORAL_TABLET | ORAL | 0 refills | Status: DC | PRN

## 2016-10-28 MED ORDER — MIDAZOLAM HCL 2 MG/2ML IJ SOLN
1.0000 mg | INTRAMUSCULAR | Status: AC
  Administered 2016-10-28 (×2): 2 mg via INTRAVENOUS

## 2016-10-28 MED ORDER — PROPOFOL 10 MG/ML IV BOLUS
INTRAVENOUS | Status: AC
Start: 2016-10-28 — End: 2016-10-28
  Filled 2016-10-28: qty 20

## 2016-10-28 MED ORDER — LIDOCAINE HCL (PF) 0.5 % IJ SOLN
INTRAMUSCULAR | Status: DC | PRN
  Administered 2016-10-28: 50 mL

## 2016-10-28 MED ORDER — CHLORHEXIDINE GLUCONATE 4 % EX LIQD: 60.0000 mL | Freq: Once | CUTANEOUS | Status: DC

## 2016-10-28 MED ORDER — FENTANYL CITRATE (PF) 100 MCG/2ML IJ SOLN: 25.0000 ug | INTRAMUSCULAR | Status: DC | PRN

## 2016-10-28 MED ORDER — FENTANYL CITRATE (PF) 100 MCG/2ML IJ SOLN
25.0000 ug | INTRAMUSCULAR | Status: AC
  Administered 2016-10-28: 25 ug via INTRAVENOUS

## 2016-10-28 SURGICAL SUPPLY — 40 items
BAG HAMPER (MISCELLANEOUS) ×3 IMPLANT
BANDAGE ACE 4 STERILE (GAUZE/BANDAGES/DRESSINGS) ×3 IMPLANT
BANDAGE ELASTIC 3 LF NS (GAUZE/BANDAGES/DRESSINGS) ×3 IMPLANT
BANDAGE ESMARK 4X12 BL STRL LF (DISPOSABLE) ×1 IMPLANT
BLADE SURG 15 STRL LF DISP TIS (BLADE) ×1 IMPLANT
BLADE SURG 15 STRL SS (BLADE) ×2
BNDG COHESIVE 4X5 TAN STRL (GAUZE/BANDAGES/DRESSINGS) ×3 IMPLANT
BNDG ESMARK 4X12 BLUE STRL LF (DISPOSABLE) ×3
BNDG GAUZE ELAST 4 BULKY (GAUZE/BANDAGES/DRESSINGS) IMPLANT
CHLORAPREP W/TINT 26ML (MISCELLANEOUS) ×3 IMPLANT
CLOTH BEACON ORANGE TIMEOUT ST (SAFETY) ×3 IMPLANT
COVER LIGHT HANDLE STERIS (MISCELLANEOUS) ×6 IMPLANT
CUFF TOURNIQUET SINGLE 18IN (TOURNIQUET CUFF) ×3 IMPLANT
DECANTER SPIKE VIAL GLASS SM (MISCELLANEOUS) ×3 IMPLANT
DRAPE PROXIMA HALF (DRAPES) ×3 IMPLANT
DRSG XEROFORM 1X8 (GAUZE/BANDAGES/DRESSINGS) IMPLANT
ELECT NEEDLE TIP 2.8 STRL (NEEDLE) ×3 IMPLANT
ELECT REM PT RETURN 9FT ADLT (ELECTROSURGICAL) ×3
ELECTRODE REM PT RTRN 9FT ADLT (ELECTROSURGICAL) ×1 IMPLANT
GAUZE SPONGE 4X4 12PLY STRL (GAUZE/BANDAGES/DRESSINGS) IMPLANT
GAUZE SPONGE 4X4 12PLY STRL LF (GAUZE/BANDAGES/DRESSINGS) ×3 IMPLANT
GAUZE XEROFORM 5X9 LF (GAUZE/BANDAGES/DRESSINGS) ×3 IMPLANT
GLOVE BIOGEL PI IND STRL 7.0 (GLOVE) ×1 IMPLANT
GLOVE BIOGEL PI INDICATOR 7.0 (GLOVE) ×2
GLOVE SKINSENSE NS SZ8.0 LF (GLOVE) ×2
GLOVE SKINSENSE STRL SZ8.0 LF (GLOVE) ×1 IMPLANT
GLOVE SS N UNI LF 8.5 STRL (GLOVE) ×3 IMPLANT
GOWN STRL REUS W/ TWL LRG LVL3 (GOWN DISPOSABLE) ×1 IMPLANT
GOWN STRL REUS W/TWL LRG LVL3 (GOWN DISPOSABLE) ×2
GOWN STRL REUS W/TWL XL LVL3 (GOWN DISPOSABLE) ×3 IMPLANT
HAND ALUMI XLG (SOFTGOODS) ×3 IMPLANT
KIT ROOM TURNOVER APOR (KITS) ×3 IMPLANT
MANIFOLD NEPTUNE II (INSTRUMENTS) ×3 IMPLANT
NEEDLE HYPO 21X1.5 SAFETY (NEEDLE) ×3 IMPLANT
NS IRRIG 1000ML POUR BTL (IV SOLUTION) ×3 IMPLANT
PACK BASIC LIMB (CUSTOM PROCEDURE TRAY) ×3 IMPLANT
PAD ARMBOARD 7.5X6 YLW CONV (MISCELLANEOUS) ×3 IMPLANT
SET BASIN LINEN APH (SET/KITS/TRAYS/PACK) ×3 IMPLANT
SUT ETHILON 3 0 FSL (SUTURE) ×3 IMPLANT
SYR CONTROL 10ML LL (SYRINGE) ×3 IMPLANT

## 2016-10-28 NOTE — Interval H&P Note (Signed)
History and Physical Interval Note:  10/28/2016 11:22 AM  Kristin [name]  has presented today for surgery, with the diagnosis of LEFT CARPAL TUNNEL  The various methods of treatment have been discussed with the patient and family. After consideration of risks, benefits and other options for treatment, the patient has consented to  Procedure(s): CARPAL TUNNEL RELEASE (Left) as a surgical intervention .  The patient's history has been reviewed, patient examined, no change in status, stable for surgery.  I have reviewed the patient's chart and labs.  Questions were answered to the patient's satisfaction.     Fuller Canada

## 2016-10-28 NOTE — Transfer of Care (Signed)
Immediate Anesthesia Transfer of Care Note  Patient: Kristin [name]  Procedure(s) Performed: Procedure(s): CARPAL TUNNEL RELEASE (Left)  Patient Location: PACU  Anesthesia Type:Bier block  Level of Consciousness: awake, alert , oriented and patient cooperative  Airway & Oxygen Therapy: Patient Spontanous Breathing and Patient connected to nasal cannula oxygen  Post-op Assessment: Report given to RN and Post -op Vital signs reviewed and stable  Post vital signs: Reviewed and stable  Last Vitals:  Vitals:   10/28/16 1127 10/28/16 1128  BP:  140/72  Pulse:    Resp: (!) 28 (!) 36  Temp:      Last Pain:  Vitals:   10/28/16 0939  TempSrc: Oral      Patients Stated Pain Goal: 6 (10/28/16 0939)  Complications: No apparent anesthesia complications

## 2016-10-28 NOTE — Op Note (Signed)
10/28/2016  12:13 PM  PATIENT:  Kristin [name]  61 y.o. female  PRE-OPERATIVE DIAGNOSIS:  LEFT CARPAL TUNNEL  POST-OPERATIVE DIAGNOSIS:  LEFT CARPAL TUNNEL  PROCEDURE:  Procedure(s): CARPAL TUNNEL RELEASE (Left)  SURGEON:  Surgeon(s) and Role:    * Vickki Hearing, MD - Primary  PHYSICIAN ASSISTANT:   ASSISTANTS: none   ANESTHESIA:   regional  EBL:  Total I/O In: 200 [I.V.:200] Out: 0   BLOOD ADMINISTERED:none  DRAINS: none   LOCAL MEDICATIONS USED:  MARCAINE     SPECIMEN:  No Specimen  DISPOSITION OF SPECIMEN:  N/A  COUNTS:  YES  TOURNIQUET:   Total Tourniquet Time Documented: Upper Arm (Left) - 29 minutes Total: Upper Arm (Left) - 29 minutes   DICTATION: .Reubin Milan Dictation  PLAN OF CARE: Discharge to home after PACU  PATIENT DISPOSITION:  PACU - hemodynamically stable.   Delay start of Pharmacological VTE agent (>24hrs) due to surgical blood loss or risk of bleeding: not applicable  Carpal tunnel release left wrist  Preop diagnosis carpal tunnel syndrome left wrist   postop diagnosis same  Procedure open carpal tunnel release left wrist Surgeon Romeo Apple  Anesthesia regional Bier block  Operative findings compression of the left median nerve without any anterior carpal tunnel masses   Indications failure of conservative treatment to relieve pain and paresthesias and numbness and tingling of the left hand  The patient was identified in the preop [name] we confirm the surgical site marked as left wrist. Chart update completed. Patient taken to surgery. She had 2 g of Ancef. After establishing a Bier block left her arm was prepped with ChloraPrep.  Timeout executed completed and confirmed site.  A straight incision was made over the left carpal tunnel in line with the radial border of the ring finger. Blunt dissection was carried out to find the distal aspect of the carpal tunnel. A blunted instrument was passed beneath the carpal tunnel. Sharp  incision was then used to release the transverse carpal ligament. The contents of the carpal tunnel were inspected. The median nerve was compressed with slight discoloration.  The wound was irrigated and then closed with 3-0 nylon suture. We injected 10 mL of plain Marcaine on the radial side of the incision  A sterile bandage was applied and the tourniquet was released the color of the hand and capillary refill were normal  The patient was taken to the recovery room in stable condition  [phone]

## 2016-10-28 NOTE — Brief Op Note (Addendum)
10/28/2016  12:13 PM  PATIENT:  Kristin [name]  60 y.o. female  PRE-OPERATIVE DIAGNOSIS:  LEFT CARPAL TUNNEL  POST-OPERATIVE DIAGNOSIS:  LEFT CARPAL TUNNEL  PROCEDURE:  Procedure(s): CARPAL TUNNEL RELEASE (Left)  SURGEON:  Surgeon(s) and Role:    * Jeshua Ransford E Mikle Sternberg, MD - Primary  PHYSICIAN ASSISTANT:   ASSISTANTS: none   ANESTHESIA:   regional  EBL:  Total I/O In: 200 [I.V.:200] Out: 0   BLOOD ADMINISTERED:none  DRAINS: none   LOCAL MEDICATIONS USED:  MARCAINE     SPECIMEN:  No Specimen  DISPOSITION OF SPECIMEN:  N/A  COUNTS:  YES  TOURNIQUET:   Total Tourniquet Time Documented: Upper Arm (Left) - 29 minutes Total: Upper Arm (Left) - 29 minutes   DICTATION: .Dragon Dictation  PLAN OF CARE: Discharge to home after PACU  PATIENT DISPOSITION:  PACU - hemodynamically stable.   Delay start of Pharmacological VTE agent (>24hrs) due to surgical blood loss or risk of bleeding: not applicable  Carpal tunnel release left wrist  Preop diagnosis carpal tunnel syndrome left wrist   postop diagnosis same  Procedure open carpal tunnel release left wrist Surgeon [name]  Anesthesia regional Bier block  Operative findings compression of the left median nerve without any anterior carpal tunnel masses   Indications failure of conservative treatment to relieve pain and paresthesias and numbness and tingling of the left hand  The patient was identified in the preop area we confirm the surgical site marked as left wrist. Chart update completed. Patient taken to surgery. She had 2 g of Ancef. After establishing a Bier block left her arm was prepped with ChloraPrep.  Timeout executed completed and confirmed site.  A straight incision was made over the left carpal tunnel in line with the radial border of the ring finger. Blunt dissection was carried out to find the distal aspect of the carpal tunnel. A blunted instrument was passed beneath the carpal tunnel. Sharp  incision was then used to release the transverse carpal ligament. The contents of the carpal tunnel were inspected. The median nerve was compressed with slight discoloration.  The wound was irrigated and then closed with 3-0 nylon suture. We injected 10 mL of plain Marcaine on the radial side of the incision  A sterile bandage was applied and the tourniquet was released the color of the hand and capillary refill were normal  The patient was taken to the recovery room in stable condition  [id number]  

## 2016-10-28 NOTE — Discharge Instructions (Signed)
Carpal Tunnel Release, Care After °Refer to this sheet in the next few weeks. These instructions provide you with information about caring for yourself after your procedure. Your health care provider may also give you more specific instructions. Your treatment has been planned according to current medical practices, but problems sometimes occur. Call your health care provider if you have any problems or questions after your procedure. °What can I expect after the procedure? °After your procedure, it is typical to have the following: °· Pain. °· Numbness. °· Tingling. °· Swelling. °· Stiffness. °· Bruising. °Follow these instructions at home: °· Take medicines only as directed by your health care provider. °· There are many different ways to close and cover an incision, including stitches (sutures), skin glue, and adhesive strips. Follow your health care provider's instructions about: °¨ Incision care. °¨ Bandage (dressing) changes and removal. °¨ Incision closure removal. °· Wear a splint or a brace as directed by your surgeon. You may need to do this for 2-3 weeks. °· Keep your hand raised (elevated) above the level of your heart while you are resting. Move your fingers often. °· Avoid activities that cause hand pain. °· Ask your surgeon when you can start to do all of your usual activities again, such as: °¨ Driving. °¨ Returning to work. °¨ Bathing and swimming. °· Keep all follow-up visits as directed by your health care provider. This is important. You may need physical therapy for several months to speed healing and regain movement. °Contact a health care provider if: °· You have drainage, redness, swelling, or pain at your incision site. °· You have a fever. °· You have chills. °· Your pain medicine is not working. °· Your symptoms do not go away after 2 months. °· Your symptoms go away and then return. °Get help right away if: °· You have pain or numbness that is getting worse. °· Your fingers change  color. °· You are not able to move your fingers. °This information is not intended to replace advice given to you by your health care provider. Make sure you discuss any questions you have with your health care provider. °Document Released: 01/09/2005 Document Revised: 11/28/2015 Document Reviewed: 02/07/2014 °Elsevier Interactive Patient Education © 2017 [employer] °PATIENT INSTRUCTIONS °POST-ANESTHESIA ° °IMMEDIATELY FOLLOWING SURGERY:  Do not drive or operate machinery for the first twenty four hours after surgery.  Do not make any important decisions for twenty four hours after surgery or while taking narcotic pain medications or sedatives.  If you develop intractable nausea and vomiting or a severe headache please notify your doctor immediately. ° °FOLLOW-UP:  Please make an appointment with your surgeon as instructed. You do not need to follow up with anesthesia unless specifically instructed to do so. ° °WOUND CARE INSTRUCTIONS (if applicable):  Keep a dry clean dressing on the anesthesia/puncture wound site if there is drainage.  Once the wound has quit draining you may leave it open to air.  Generally you should leave the bandage intact for twenty four hours unless there is drainage.  If the epidural site drains for more than 36-48 hours please call the anesthesia department. ° °QUESTIONS?:  Please feel free to call your physician or the hospital operator if you have any questions, and they will be happy to assist you.    ° ° ° °

## 2016-10-28 NOTE — Anesthesia Postprocedure Evaluation (Signed)
Anesthesia Post Note  Patient: Kristin [name]  Procedure(s) Performed: Procedure(s) (LRB): CARPAL TUNNEL RELEASE (Left)  Patient location during evaluation: PACU Anesthesia Type: Bier Block Level of consciousness: awake and alert and oriented Pain management: pain level controlled Vital Signs Assessment: post-procedure vital signs reviewed and stable Respiratory status: spontaneous breathing, patient connected to nasal cannula oxygen and respiratory function stable Cardiovascular status: stable Postop Assessment: no signs of nausea or vomiting Anesthetic complications: no     Last Vitals:  Vitals:   10/28/16 1127 10/28/16 1128  BP:  140/72  Pulse:    Resp: (!) 28 (!) 36  Temp:      Last Pain:  Vitals:   10/28/16 0939  TempSrc: Oral                 Yasmene Salomone A

## 2016-10-28 NOTE — OR Nursing (Signed)
Up to bathroom to void 

## 2016-10-28 NOTE — Anesthesia Preprocedure Evaluation (Signed)
Anesthesia Evaluation  Patient identified by MRN, date of birth, ID band Patient awake    Reviewed: Allergy & Precautions, NPO status , Patient's Chart, lab work & pertinent test results  Airway Mallampati: I  TM Distance: >3 FB     Dental  (+) Teeth Intact   Pulmonary Current Smoker,    breath sounds clear to auscultation       Cardiovascular hypertension, Pt. on medications  Rhythm:Regular Rate:Normal     Neuro/Psych Anxiety    GI/Hepatic GERD  Controlled and Medicated,  Endo/Other  Hypothyroidism   Renal/GU      Musculoskeletal   Abdominal   Peds  Hematology   Anesthesia Other Findings   Reproductive/Obstetrics                             Anesthesia Physical Anesthesia Plan  ASA: II  Anesthesia Plan: Bier Block   Post-op Pain Management:    Induction: Intravenous  Airway Management Planned: Simple Face Mask  Additional Equipment:   Intra-op Plan:   Post-operative Plan:   Informed Consent: I have reviewed the patients History and Physical, chart, labs and discussed the procedure including the risks, benefits and alternatives for the proposed anesthesia with the patient or authorized representative who has indicated his/her understanding and acceptance.     Plan Discussed with:   Anesthesia Plan Comments:         Anesthesia Quick Evaluation

## 2016-10-30 ENCOUNTER — Encounter (HOSPITAL_COMMUNITY): Payer: Self-pay | Admitting: Orthopedic Surgery

## 2016-11-02 ENCOUNTER — Ambulatory Visit (INDEPENDENT_AMBULATORY_CARE_PROVIDER_SITE_OTHER): Payer: Medicaid Other | Admitting: Orthopedic Surgery

## 2016-11-02 ENCOUNTER — Encounter: Payer: Self-pay | Admitting: Orthopedic Surgery

## 2016-11-02 DIAGNOSIS — Z4889 Encounter for other specified surgical aftercare: Secondary | ICD-10-CM

## 2016-11-02 DIAGNOSIS — Z9889 Other specified postprocedural states: Secondary | ICD-10-CM

## 2016-11-02 MED ORDER — HYDROCODONE-ACETAMINOPHEN 5-325 MG PO TABS: 1.0000 | ORAL_TABLET | Freq: Four times a day (QID) | ORAL | 0 refills | Status: DC | PRN

## 2016-11-02 NOTE — Progress Notes (Signed)
Lindia is status post left carpal tunnel release   S:   Chief Complaint  Patient presents with  . Follow-up    POST OP LEFT CTR, DOS 10/28/16   Doing well.   The patient complains of  Some numbness and tingling   Current pain medication: norco 5   The incision is clean dry and intact with no drainage The dressing was changed.  Meds ordered this encounter  Medications  . HYDROcodone-acetaminophen (NORCO/VICODIN) 5-325 MG tablet    Sig: Take 1 tablet by mouth every 6 (six) hours as needed for moderate pain.    Dispense:  30 tablet    Refill:  0    Encounter Diagnoses  Name Primary?  Marland Kitchen Aftercare following surgery Yes  . History of carpal tunnel surgery of left wrist       Patient to return for suture removal ~ POD 12-14

## 2016-11-13 ENCOUNTER — Ambulatory Visit: Payer: Medicaid Other | Admitting: Orthopedic Surgery

## 2017-09-29 NOTE — Congregational Nurse Program (Signed)
Congregational Nurse Program Note  Date of Encounter: 09/29/2017  Past Medical History: Past Medical History:  Diagnosis Date  . Anxiety   . GERD (gastroesophageal reflux disease)   . History of kidney stones   . Hypertension   . Hypothyroidism   . Thyroid disease     Encounter Details: CNP Questionnaire - 09/29/17 1038      Questionnaire   Race  White or Caucasian    Location Patient Served At  [employer] [facility] [employer] [place]    Insurance  Medicaid    Uninsured  Not Applicable    Food  No food insecurities    Housing/Utilities  Yes, have permanent housing    Transportation  No transportation needs    Interpersonal Safety  Yes, feel physically and emotionally safe where you currently live    Medication  No medication insecurities    Medical Provider  Yes    Referrals  Not Applicable    ED Visit Averted  Not Applicable    Life-Saving Intervention Made  Not Applicable     [facility] at [facility] B/P 169/81 P 62 Stated she has appointment to see MD next month Jenene SlickerEmma Marleena Shubert RN, [place] [facility] [phone]

## 2019-08-02 ENCOUNTER — Other Ambulatory Visit (HOSPITAL_COMMUNITY): Payer: Self-pay | Admitting: Family Medicine

## 2019-08-02 ENCOUNTER — Ambulatory Visit (HOSPITAL_COMMUNITY)
Admission: RE | Admit: 2019-08-02 | Discharge: 2019-08-02 | Disposition: A | Discharge status: Home / Self Care | Payer: Medicare Other | Source: Ambulatory Visit | Attending: Family Medicine | Admitting: Family Medicine

## 2019-08-02 ENCOUNTER — Other Ambulatory Visit: Payer: Self-pay

## 2019-08-02 DIAGNOSIS — M545 Low back pain, unspecified: Secondary | ICD-10-CM

## 2019-08-02 DIAGNOSIS — M542 Cervicalgia: Secondary | ICD-10-CM | POA: Insufficient documentation

## 2019-09-04 ENCOUNTER — Other Ambulatory Visit: Payer: Self-pay | Admitting: Family Medicine

## 2019-09-04 ENCOUNTER — Other Ambulatory Visit (HOSPITAL_COMMUNITY): Payer: Self-pay | Admitting: Family Medicine

## 2019-09-04 DIAGNOSIS — M545 Low back pain, unspecified: Secondary | ICD-10-CM

## 2019-09-28 ENCOUNTER — Encounter (HOSPITAL_COMMUNITY): Payer: Self-pay

## 2019-09-28 ENCOUNTER — Ambulatory Visit (HOSPITAL_COMMUNITY): Payer: Medicare Other

## 2019-09-28 ENCOUNTER — Telehealth (HOSPITAL_COMMUNITY): Payer: Self-pay | Admitting: Family Medicine

## 2019-09-28 NOTE — Telephone Encounter (Signed)
09/28/19~Called x 2 / LM on VM for patient to c/b ref MRI appt cncld due to Machine not working. MF

## 2019-10-31 ENCOUNTER — Ambulatory Visit (HOSPITAL_COMMUNITY): Payer: Medicare Other

## 2019-11-09 ENCOUNTER — Ambulatory Visit (HOSPITAL_COMMUNITY): Admission: RE | Admit: 2019-11-09 | Payer: Medicare Other | Source: Ambulatory Visit

## 2019-11-28 ENCOUNTER — Ambulatory Visit (HOSPITAL_COMMUNITY): Payer: Medicare Other

## 2019-12-14 ENCOUNTER — Ambulatory Visit (HOSPITAL_COMMUNITY): Payer: Medicare Other

## 2019-12-28 ENCOUNTER — Other Ambulatory Visit (HOSPITAL_COMMUNITY): Payer: Self-pay | Admitting: Family Medicine

## 2019-12-28 DIAGNOSIS — N63 Unspecified lump in unspecified breast: Secondary | ICD-10-CM

## 2020-01-09 ENCOUNTER — Ambulatory Visit (HOSPITAL_COMMUNITY): Payer: Medicare Other

## 2020-01-18 ENCOUNTER — Other Ambulatory Visit: Payer: Self-pay

## 2020-01-18 ENCOUNTER — Ambulatory Visit (HOSPITAL_COMMUNITY)
Admission: RE | Admit: 2020-01-18 | Discharge: 2020-01-18 | Disposition: A | Discharge status: Home / Self Care | Payer: Medicare Other | Source: Ambulatory Visit | Attending: Family Medicine | Admitting: Family Medicine

## 2020-01-18 DIAGNOSIS — N6321 Unspecified lump in the left breast, upper outer quadrant: Secondary | ICD-10-CM | POA: Insufficient documentation

## 2020-01-18 DIAGNOSIS — N63 Unspecified lump in unspecified breast: Secondary | ICD-10-CM | POA: Diagnosis present

## 2020-02-02 ENCOUNTER — Ambulatory Visit (HOSPITAL_COMMUNITY)
Admission: RE | Admit: 2020-02-02 | Discharge: 2020-02-02 | Disposition: A | Discharge status: Home / Self Care | Payer: Medicare Other | Source: Ambulatory Visit | Attending: Family Medicine | Admitting: Family Medicine

## 2020-02-02 ENCOUNTER — Other Ambulatory Visit: Payer: Self-pay

## 2020-02-02 DIAGNOSIS — M545 Low back pain, unspecified: Secondary | ICD-10-CM

## 2021-03-14 ENCOUNTER — Other Ambulatory Visit (HOSPITAL_COMMUNITY): Payer: Self-pay | Admitting: Physician Assistant

## 2021-03-14 DIAGNOSIS — Z87891 Personal history of nicotine dependence: Secondary | ICD-10-CM

## 2021-03-14 DIAGNOSIS — R42 Dizziness and giddiness: Secondary | ICD-10-CM

## 2021-06-02 NOTE — Congregational Nurse Program (Signed)
Blood pressure checked today at [employer]. She reports that she takes blood pressure medications and took medications today. She later reports she is out of lisinopril and doubled up on another medication. Counseled client on not changing her medications or doubling up on medications unless consulting her MD. Encouraged client to get her Lisinopril filled and to take as directed. She reports she has a visit with her MD in [facility] tomorrow. Discussed that changing and doubling medications can be dangerous. She reports understanding. Counseled client on smoking cessation and how it relates to blood pressure.   Today: 168/69 left arm, sitting normal cuff. Pulse 48. Client reports her pulse was low at her last MD visit. Again educated her that doubling medications and or changing her medications without consulting her MD could have adverse affects as some medications lower heart rate as well as blood pressure. Client not sure of all medications.  Francee Nodal RN [facility] [employer].

## 2021-07-27 IMAGING — US US BREAST*L* LIMITED INC AXILLA
1 series · 6 of 6 positions shown · non-contrast
Comparison: Previous exam(s).

CLINICAL DATA: The patient had recent bruising in the left breast.
While the bruise is no longer visibly present, she has a lump at the
site of bruising.

EXAM:
DIGITAL DIAGNOSTIC BILATERAL MAMMOGRAM WITH CAD AND TOMO
ULTRASOUND LEFT BREAST

[Series 1: us breast*left* limited inc axilla · 0.06mm/px · 6 of 6 slices shown]
[im 1/6]
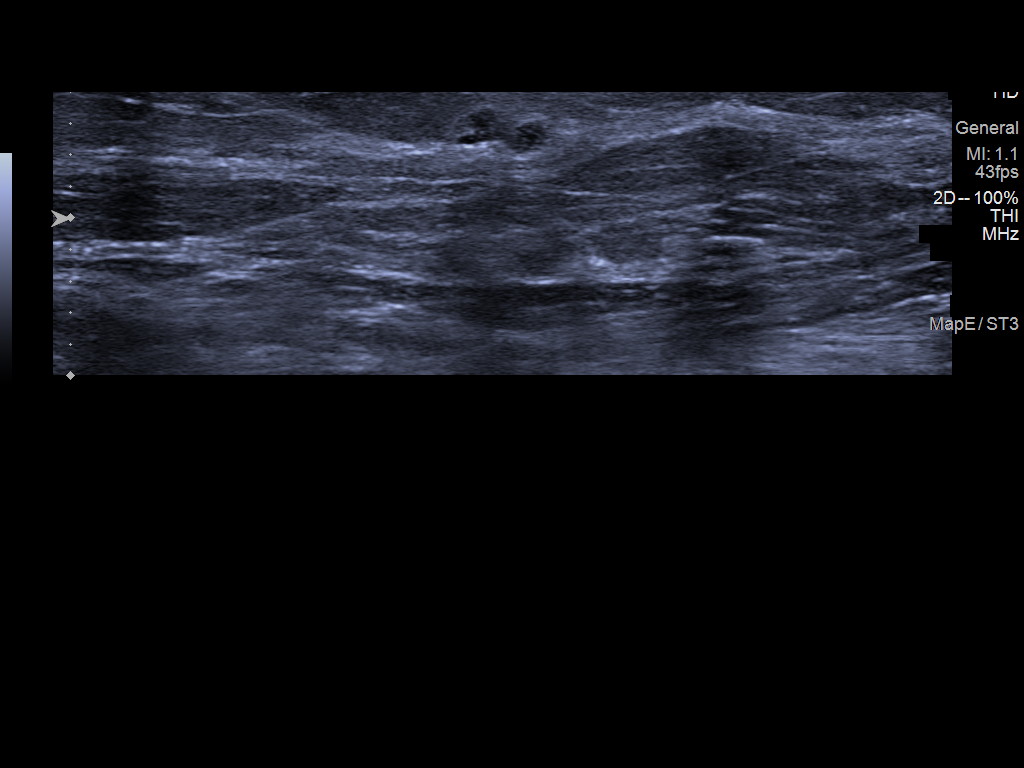
[im 2/6]
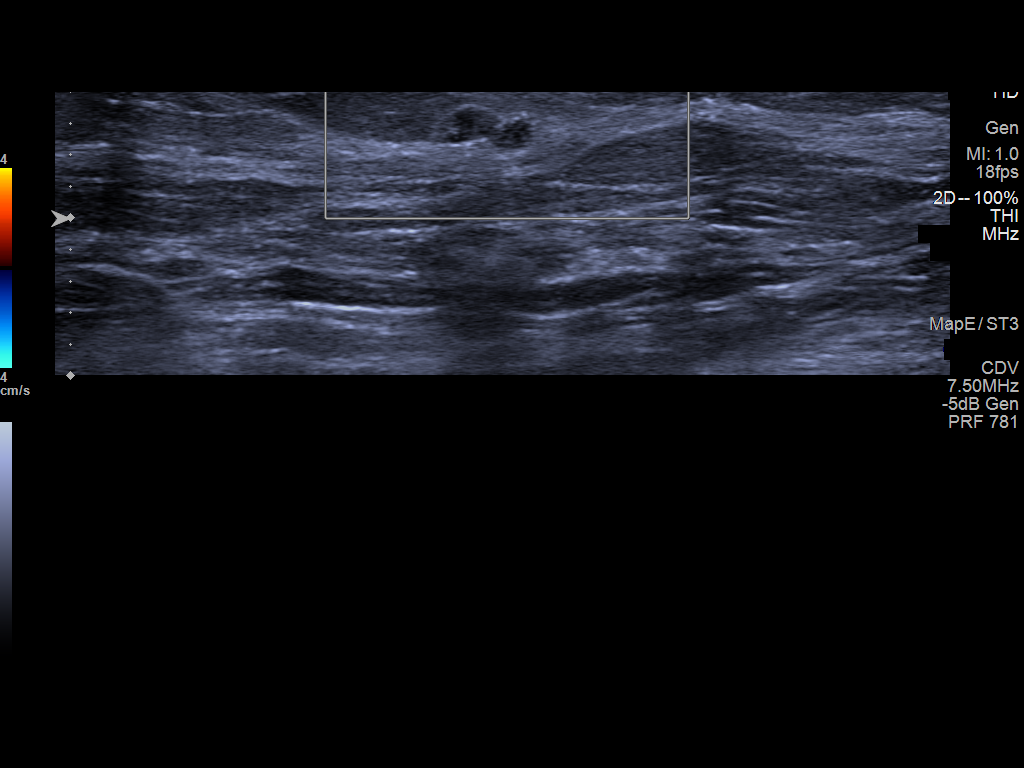
[im 3/6]
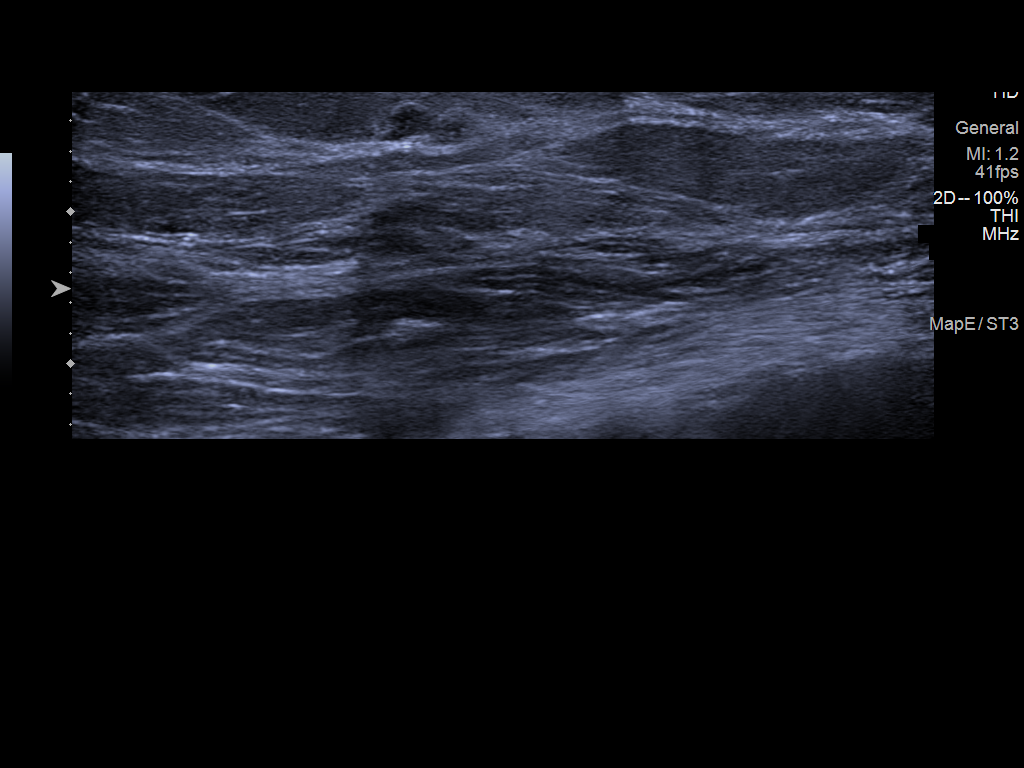
[im 4/6]
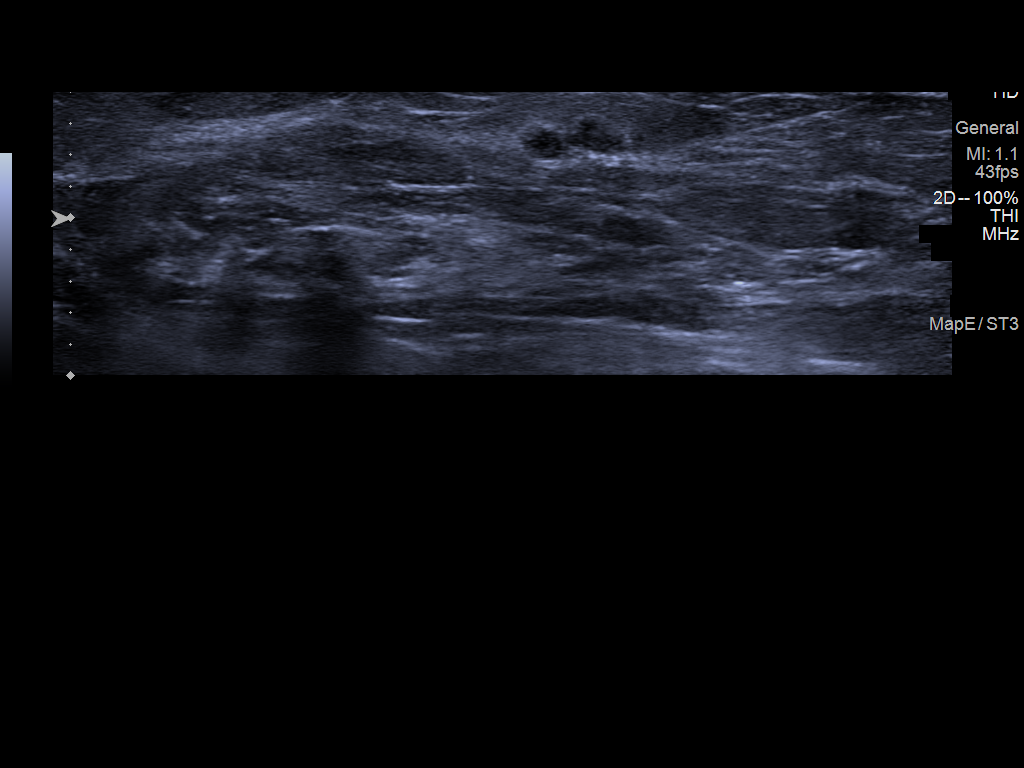
[im 5/6]
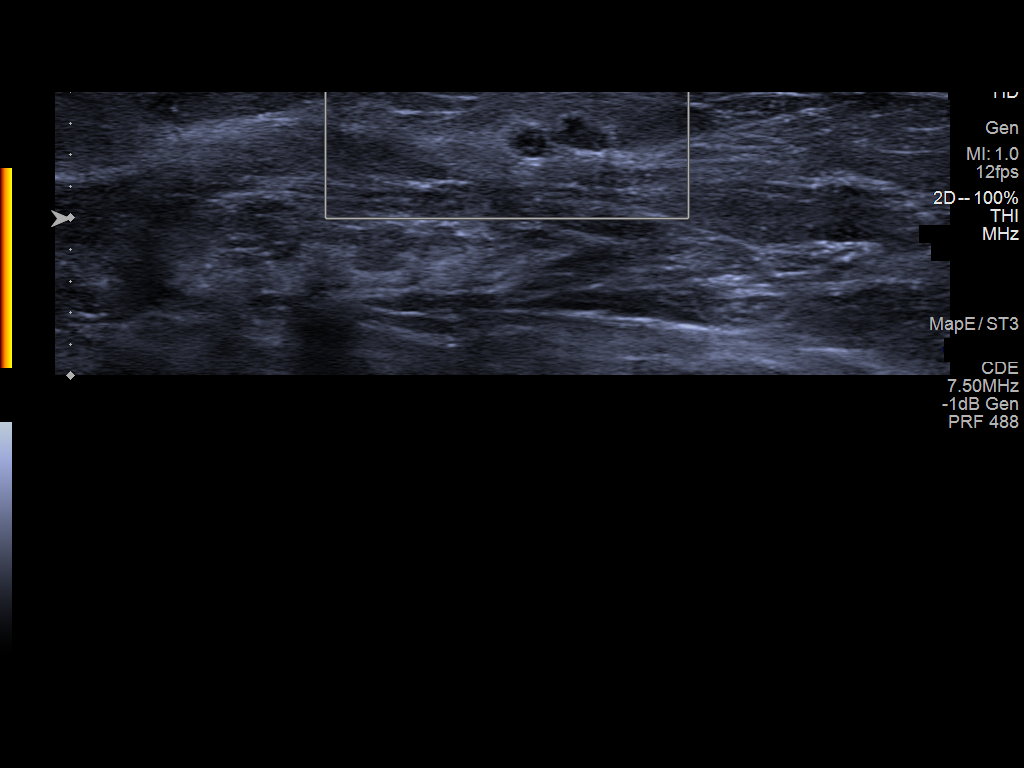
[im 6/6]
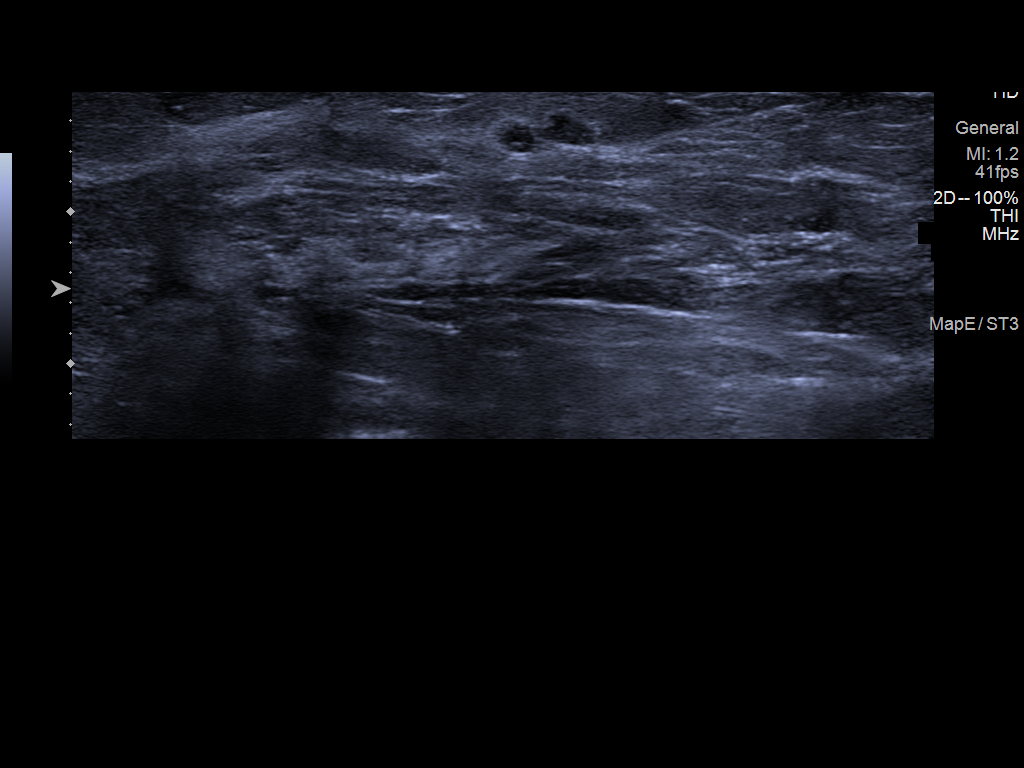

[6 of 6 positions shown; findings below may reference images not displayed]

ACR Breast Density Category b: There are scattered areas of
fibroglandular density.
FINDINGS: An asymmetry in the right breast resolves on additional imaging. A
mass in the right breast has been present since at least 7763
without change. No other suspicious findings on the right.

There is an island of dense glandular tissue in the region of the
patient's lump in the upper outer left breast. No other suspicious
mammographic findings are identified.

Mammographic images were processed with CAD.

On physical exam, there is a subtle lump pointed out by the patient
at 3 o'clock.

Targeted ultrasound is performed, showing a bilobed hypoechoic mass
measuring up to 5 mm in the region of the patient's lump and region
of prior bruising. No internal blood flow identified.
IMPRESSION: The hypoechoic mass seen sonographically at the site of the
patient's lump is probably resolving hematoma. A complicated cyst is
possible. A solid mass is not completely excluded on today's
imaging.

RECOMMENDATION:
Recommend 3 month follow-up ultrasound of the patient's palpable
lump, favored to represent resolving hematoma, to ensure resolution.

I have discussed the findings and recommendations with the patient.
If applicable, a reminder letter will be sent to the patient
regarding the next appointment.

BI-RADS CATEGORY  3: Probably benign.

## 2021-07-27 IMAGING — MG DIGITAL DIAGNOSTIC BILAT W/ TOMO W/ CAD
6 of 12 series · 6 of 36 positions shown · non-contrast
Comparison: Previous exam(s).

CLINICAL DATA: The patient had recent bruising in the left breast.
While the bruise is no longer visibly present, she has a lump at the
site of bruising.

EXAM:
DIGITAL DIAGNOSTIC BILATERAL MAMMOGRAM WITH CAD AND TOMO
ULTRASOUND LEFT BREAST

[R MLO synth-2D]
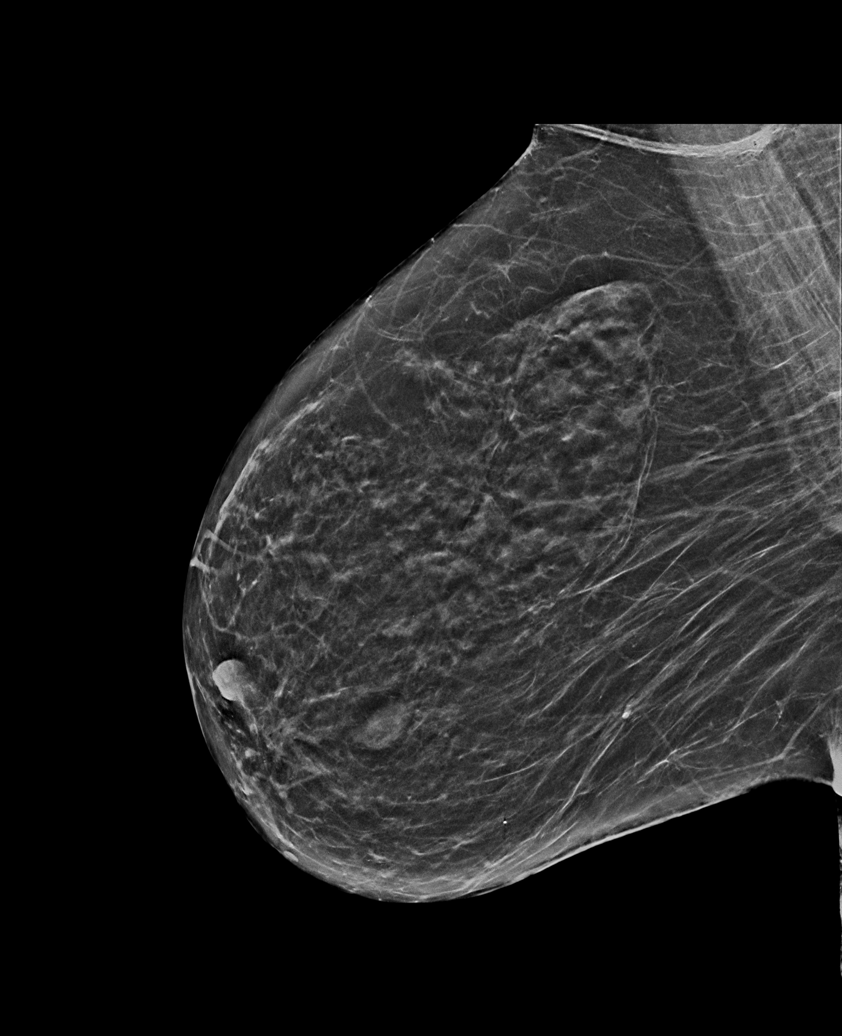

[L TAN synth-2D]
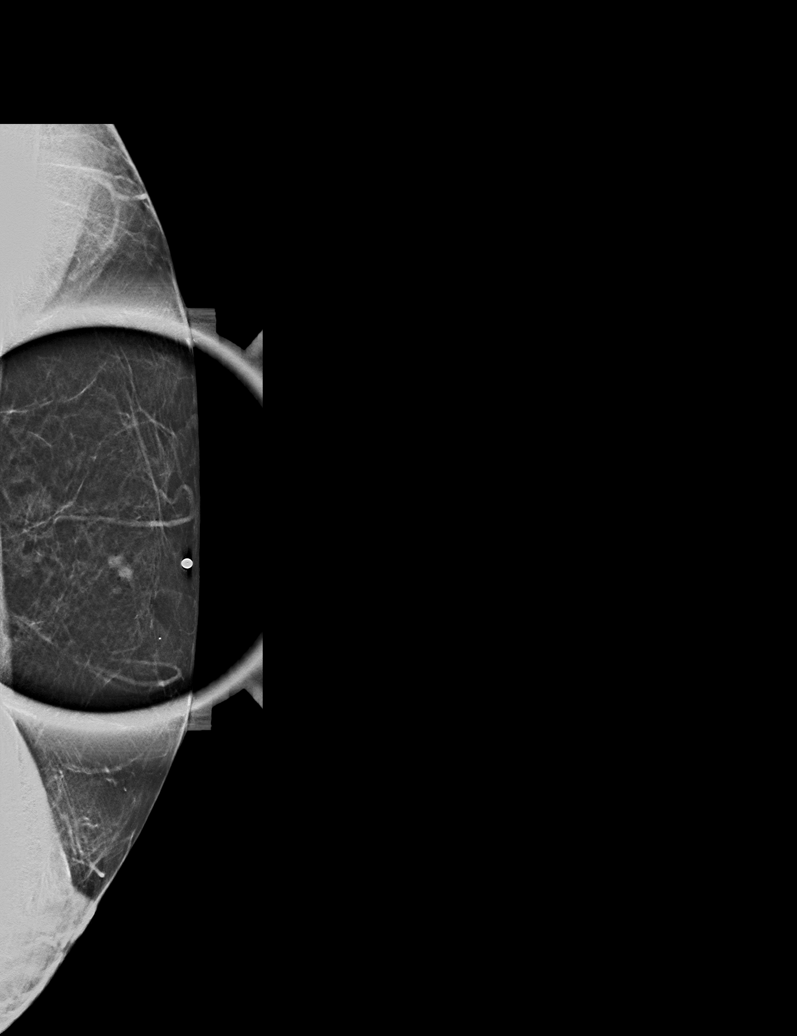

[R CC synth-2D (1 of 2)]
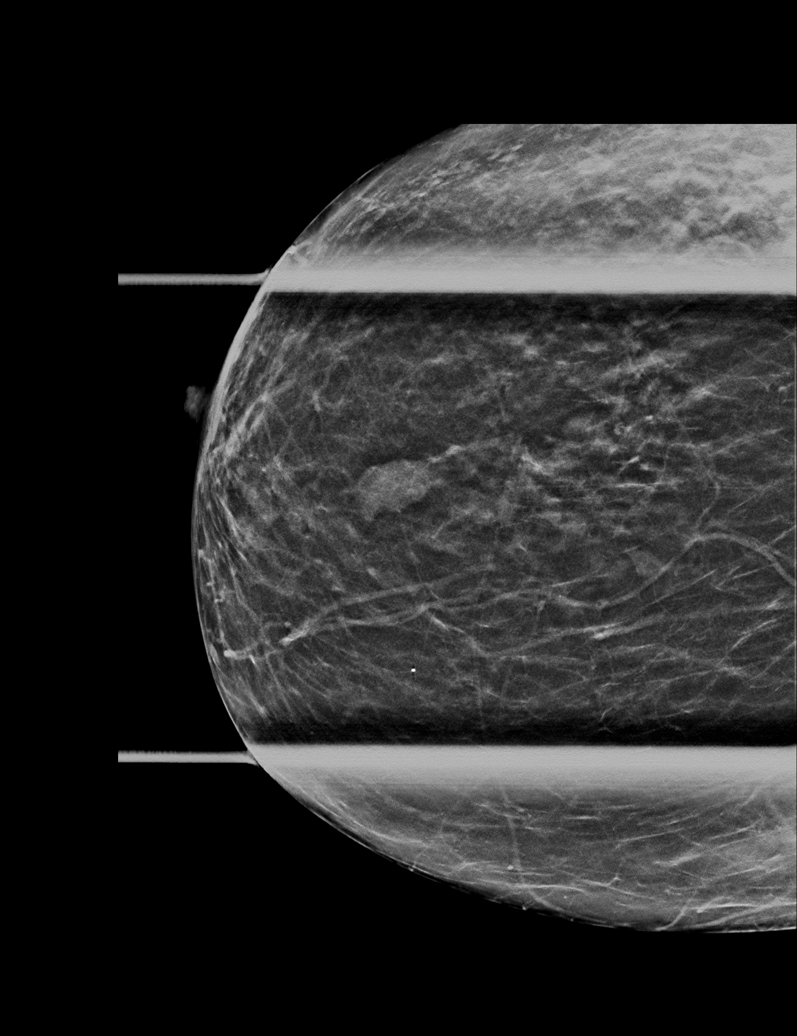

[R CC synth-2D (2 of 2)]
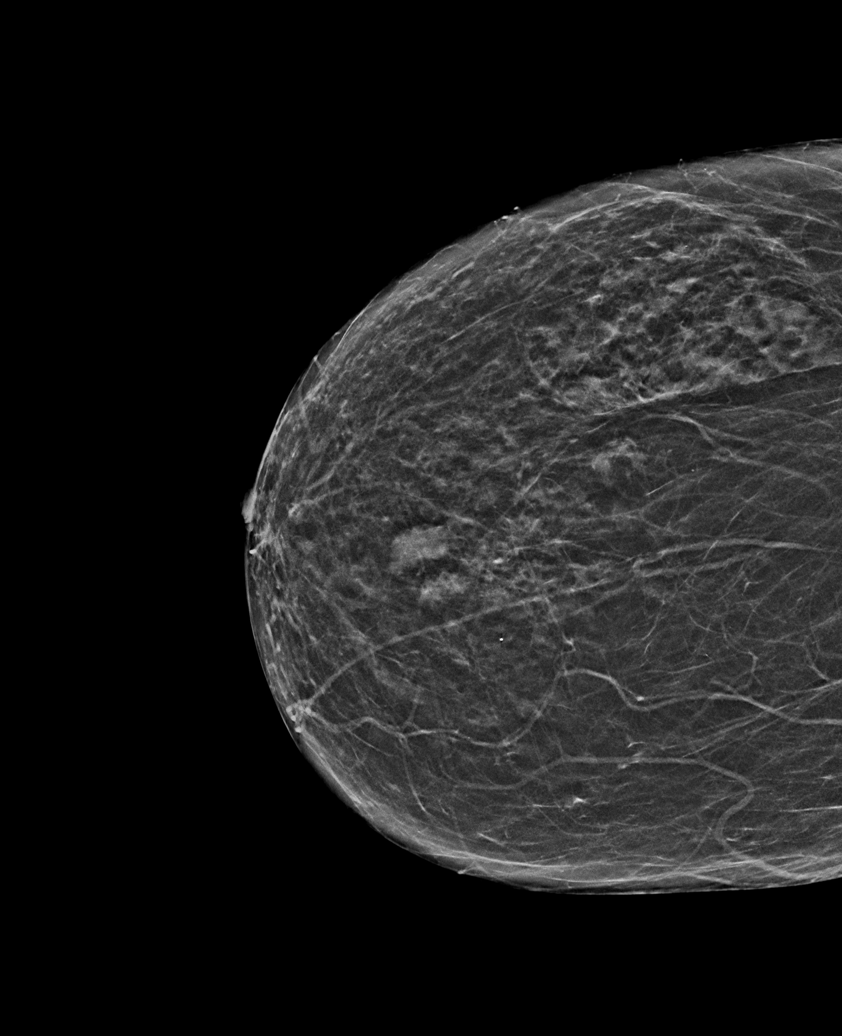

[L MLO synth-2D]
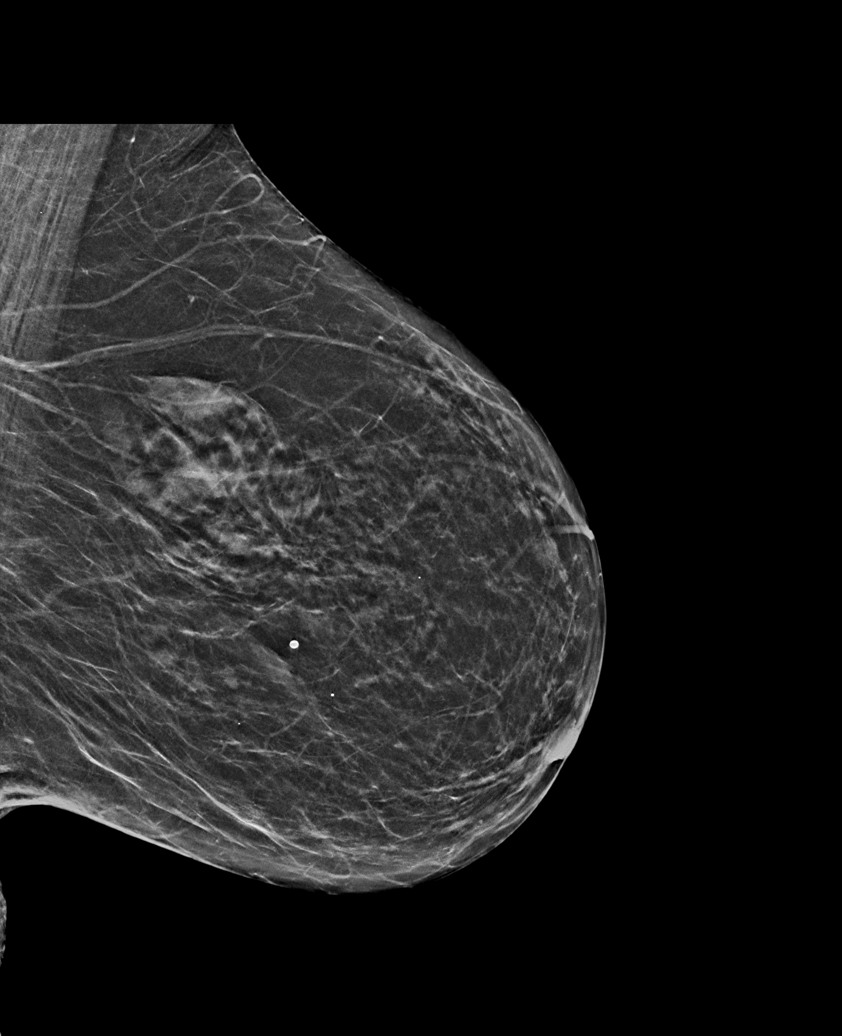

[L CC synth-2D]
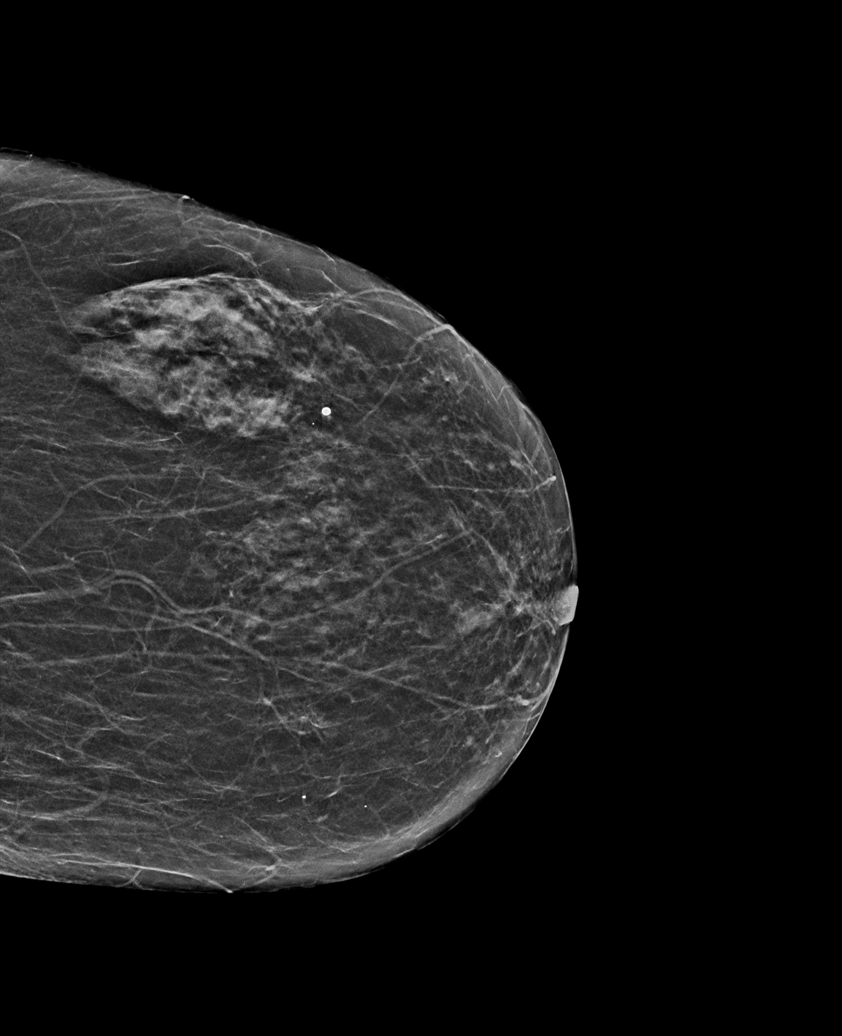

[6 of 36 positions shown; findings below may reference images not displayed]

ACR Breast Density Category b: There are scattered areas of
fibroglandular density.
FINDINGS: An asymmetry in the right breast resolves on additional imaging. A
mass in the right breast has been present since at least 7763
without change. No other suspicious findings on the right.

There is an island of dense glandular tissue in the region of the
patient's lump in the upper outer left breast. No other suspicious
mammographic findings are identified.

Mammographic images were processed with CAD.

On physical exam, there is a subtle lump pointed out by the patient
at 3 o'clock.

Targeted ultrasound is performed, showing a bilobed hypoechoic mass
measuring up to 5 mm in the region of the patient's lump and region
of prior bruising. No internal blood flow identified.
IMPRESSION: The hypoechoic mass seen sonographically at the site of the
patient's lump is probably resolving hematoma. A complicated cyst is
possible. A solid mass is not completely excluded on today's
imaging.

RECOMMENDATION:
Recommend 3 month follow-up ultrasound of the patient's palpable
lump, favored to represent resolving hematoma, to ensure resolution.

I have discussed the findings and recommendations with the patient.
If applicable, a reminder letter will be sent to the patient
regarding the next appointment.

BI-RADS CATEGORY  3: Probably benign.

## 2021-09-05 ENCOUNTER — Other Ambulatory Visit: Payer: Self-pay | Admitting: Internal Medicine

## 2021-09-05 DIAGNOSIS — Z139 Encounter for screening, unspecified: Secondary | ICD-10-CM

## 2021-09-17 ENCOUNTER — Inpatient Hospital Stay: Admission: RE | Admit: 2021-09-17 | Payer: Medicare Other | Source: Ambulatory Visit

## 2022-08-07 ENCOUNTER — Other Ambulatory Visit: Payer: Self-pay

## 2022-08-07 ENCOUNTER — Emergency Department (HOSPITAL_COMMUNITY)
Admission: EM | Admit: 2022-08-07 | Discharge: 2022-08-07 | Disposition: A | Discharge status: Home / Self Care | Payer: 59 | Attending: Emergency Medicine | Admitting: Emergency Medicine

## 2022-08-07 DIAGNOSIS — Z79899 Other long term (current) drug therapy: Secondary | ICD-10-CM | POA: Diagnosis not present

## 2022-08-07 DIAGNOSIS — Z0283 Encounter for blood-alcohol and blood-drug test: Secondary | ICD-10-CM | POA: Insufficient documentation

## 2022-08-07 LAB — RAPID URINE DRUG SCREEN, HOSP PERFORMED
Amphetamines: NOT DETECTED
Barbiturates: NOT DETECTED
Benzodiazepines: NOT DETECTED
Cocaine: NOT DETECTED
Opiates: NOT DETECTED
Tetrahydrocannabinol: NOT DETECTED

## 2022-08-07 NOTE — ED Provider Notes (Signed)
Kristin [name]   CSN: [id number] Arrival date & time: 08/07/22  1013     History  Chief Complaint  Patient presents with   Drug / Alcohol Assessment    Kristin [name] is a 67 y.o. female.  67 year old female who presents emergency department with request for drug screens.  She is residing at a facility where they found Xanax on top of her dresser.  She states that is not her and she has not taken any Xanax.  Not on any other benzodiazepines.  Does take Pristiq for anxiety.  No alcohol or other recreational substance use.  She agrees to having the urine drug testing done today.       Home Medications Prior to Admission medications   Medication Sig Start Date End Date Taking? Authorizing Provider  HYDROcodone-acetaminophen (NORCO/VICODIN) 5-325 MG tablet Take 1 tablet by mouth every 6 (six) hours as needed for moderate pain. 11/02/16   Carole Civil, MD  levothyroxine (SYNTHROID, LEVOTHROID) 125 MCG tablet Take 125 mcg by mouth daily before breakfast.    [provider]  lisinopril (PRINIVIL,ZESTRIL) 20 MG tablet Take 20 mg by mouth daily.    [provider]  omeprazole (PRILOSEC) 20 MG capsule Take 20 mg by mouth daily.    [provider]  tiZANidine (ZANAFLEX) 4 MG tablet Take 4 mg by mouth at bedtime.     [provider]  topiramate (TOPAMAX) 50 MG tablet Take 50 mg by mouth at bedtime.     [provider]  traMADol (ULTRAM) 50 MG tablet Take 50 mg by mouth at bedtime.     [provider]  venlafaxine XR (EFFEXOR-XR) 150 MG 24 hr capsule Take 150 mg by mouth daily with breakfast.    [provider]      Allergies    Patient has no known allergies.    Review of Systems   Review of Systems  Physical Exam Updated Vital Signs BP (!) 134/54   Pulse 63   Temp 98.1 F (36.7 C) (Oral)   Resp 16   Ht 5\' 3"  (1.6 m)   Wt 72.6 kg   SpO2 96%   BMI 28.34  kg/m  Physical Exam Vitals and nursing [name] reviewed.  Constitutional:      General: She is not in acute distress.    Appearance: She is well-developed.  HENT:     Head: Normocephalic and atraumatic.  Eyes:     Conjunctiva/sclera: Conjunctivae normal.  Cardiovascular:     Rate and Rhythm: Normal rate and regular rhythm.  Pulmonary:     Effort: Pulmonary effort is normal. No respiratory distress.  Musculoskeletal:     Cervical back: Neck supple.  Skin:    General: Skin is warm and dry.     Capillary Refill: Capillary refill takes less than 2 seconds.  Neurological:     Mental Status: She is alert.     Comments: No tremor or tongue fasciculations noted.  Psychiatric:        Mood and Affect: Mood normal.     ED Results / Procedures / Treatments   Labs (all labs ordered are listed, but only abnormal results are displayed) Labs Reviewed  RAPID URINE DRUG SCREEN, HOSP PERFORMED    EKG None  Radiology No results found.  Procedures Procedures   Medications Ordered in ED Medications - No data to display  ED Course/ Medical Decision Making/ A&P  Medical Decision Making Amount and/or Complexity of Data Reviewed Labs: ordered.   Kristin [name] is a 67 y.o. female who presents to the emergency department with request for drug screen  Initial Ddx:  Intoxication, benzodiazepine use, withdrawals  MDM:  Patient does not appear to be intoxicated.  Does not appear that she is using benzodiazepines and does not appear to be in withdrawals at this time.  Will obtain drug screen at the patient's request so that she can return to her housing.  Plan:  Urine drug screen  ED Summary/Re-evaluation:  Patient's urine drug screen returned without benzodiazepines present or other substances.  Was discharged home with a copy of her drug screen.   This patient presents to the ED for concern of complaints listed in HPI, this involves an extensive  number of treatment options, and is a complaint that carries with it a high risk of complications and morbidity. Disposition including potential need for admission considered.   Dispo: DC to Facility  Records reviewed Outpatient Clinic Notes The following labs were independently interpreted:  Urine drug screen  and show no acute abnormality I have reviewed the patients home medications and made adjustments as needed Social Determinants of health:  Facility resident   Final Clinical Impression(s) / ED Diagnoses Final diagnoses:  Encounter for drug screening    Rx / DC Orders ED Discharge Orders     None         Fransico Meadow, MD 08/07/22 1749

## 2022-08-07 NOTE — Discharge Instructions (Signed)
You were seen for a drug screen in the emergency department. It was NEGATIVE. There were not any drugs found in your system.   Thank you for visiting our Emergency Department. It was a pleasure taking care of you today.

## 2022-08-07 NOTE — ED Triage Notes (Addendum)
Pt bib EMS from [place], sent here for drug screen. Per EMS, cleaning lady found unknown medication in her room, pt claims it is Xanax per [employer] that worked last night, but denies taking Xanax. Pt denies any complaints at this time. Pt ao x 4. Pt thinks that the housekeeper set her up because of the dispute they had.   BP 162/84 HR 74 SPO2 96%

## 2022-08-14 ENCOUNTER — Ambulatory Visit
Admission: RE | Admit: 2022-08-14 | Discharge: 2022-08-14 | Disposition: A | Discharge status: Home / Self Care | Payer: 59 | Source: Ambulatory Visit

## 2022-08-14 VITALS — BP 194/74 | HR 68 | Temp 98.7°F | Resp 20

## 2022-08-14 DIAGNOSIS — K047 Periapical abscess without sinus: Secondary | ICD-10-CM | POA: Diagnosis not present

## 2022-08-14 MED ORDER — IBUPROFEN 800 MG PO TABS: 800.0000 mg | ORAL_TABLET | Freq: Three times a day (TID) | ORAL | 0 refills | Status: DC

## 2022-08-14 MED ORDER — IBUPROFEN 800 MG PO TABS
800.0000 mg | ORAL_TABLET | Freq: Once | ORAL | Status: AC
  Administered 2022-08-14: 800 mg via ORAL

## 2022-08-14 MED ORDER — AMOXICILLIN 500 MG PO CAPS: 500.0000 mg | ORAL_CAPSULE | Freq: Two times a day (BID) | ORAL | 0 refills | Status: DC

## 2022-08-14 NOTE — Discharge Instructions (Addendum)
You have left tooth abscess with possible nerve involvement.  You have been prescribed Amoxicillin 500 mg for infection. You were given Ibuprofen 800 mg for the pain in clinic. I will provide you with a prescription of Ibuprofen 800 mg.  Continue to use gabapentin as directed this will help with nerve pain.

## 2022-08-14 NOTE — ED Provider Notes (Signed)
[facility]    CSN: [id number] Arrival date & time: 08/14/22  1247      History   Chief Complaint Chief Complaint  Patient presents with   Dental Problem    Entered by patient    HPI Kristin [name] is a 67 y.o. female.   HPI  She is in today for tooth pain for one week that is progressing. She reports that she is unsure if she broke a tooth. She as has a dental apt but cannot get in today. She has used APAP with no relief. Denies headache, dizziness, visual changes, shortness of breath, dyspnea on exertion, chest pain, nausea, vomiting or any edema.   Past Medical History:  Diagnosis Date   Anxiety    GERD (gastroesophageal reflux disease)    History of kidney stones    Hypertension    Hypothyroidism    Thyroid disease     There are no problems to display for this patient.   Past Surgical History:  Procedure Laterality Date   CARPAL TUNNEL RELEASE Left 10/28/2016   Procedure: CARPAL TUNNEL RELEASE;  Surgeon: Carole Civil, MD;  Location: [facility];  Service: Orthopedics;  Laterality: Left;   CESAREAN SECTION     ELBOW SURGERY     right   FINGER SURGERY     left pinky finger   HEMATOMA EVACUATION     left hip   KNEE SURGERY     right   TONSILLECTOMY      OB History     Gravida  3   Para      Term      Preterm      AB      Living  3      SAB      IAB      Ectopic      Multiple      Live Births               Home Medications    Prior to Admission medications   Medication Sig Start Date End Date Taking? Authorizing Provider  albuterol (VENTOLIN HFA) 108 (90 Base) MCG/ACT inhaler SMARTSIG:1 Puff(s) By Mouth Every 6 Hours PRN 03/19/22  Yes [provider]  alendronate (FOSAMAX) 70 MG tablet Take 70 mg by mouth once a week. 08/07/22  Yes [provider]  amoxicillin (AMOXIL) 500 MG capsule Take 1 capsule (500 mg total) by mouth 2 (two) times daily for 10 days. 08/14/22 08/24/22 Yes Vevelyn Francois, NP   cloNIDine (CATAPRES) 0.1 MG tablet Take 0.1 mg by mouth 2 (two) times daily. 07/06/22  Yes [provider]  desvenlafaxine (PRISTIQ) 25 MG 24 hr tablet Take 25 mg by mouth daily. 04/16/22  Yes [provider]  gabapentin (NEURONTIN) 600 MG tablet Take 600 mg by mouth 3 (three) times daily. 07/09/22  Yes [provider]  ibuprofen (ADVIL) 800 MG tablet Take 1 tablet (800 mg total) by mouth 3 (three) times daily. 08/14/22  Yes Vevelyn Francois, NP  pantoprazole (PROTONIX) 40 MG tablet Take 40 mg by mouth daily. 07/06/22  Yes [provider]  pravastatin (PRAVACHOL) 40 MG tablet Take 40 mg by mouth daily. 07/06/22  Yes [provider]  Donnal Debar 200-62.5-25 MCG/ACT AEPB Inhale 1 puff into the lungs daily. 08/06/22  Yes [provider]  levothyroxine (SYNTHROID, LEVOTHROID) 125 MCG tablet Take 125 mcg by mouth daily before breakfast.    [provider]  lisinopril (PRINIVIL,ZESTRIL)  20 MG tablet Take 20 mg by mouth daily.    [provider]  omeprazole (PRILOSEC) 20 MG capsule Take 20 mg by mouth daily.    [provider]  tiZANidine (ZANAFLEX) 4 MG tablet Take 4 mg by mouth at bedtime.     [provider]    Family History Family History  Problem Relation Age of Onset   Diabetes Mother    COPD Mother    Heart attack Mother    Diabetes Father    Emphysema Father    Heart attack Father    Kidney disease Maternal Aunt    Osteoporosis Maternal Aunt     Social History Social History   Tobacco Use   Smoking status: Every Day    Packs/day: 0.25    Years: 30.00    Total pack years: 7.50    Types: Cigarettes   Smokeless tobacco: Never  Substance Use Topics   Alcohol use: Yes    Alcohol/week: 6.0 standard drinks of alcohol    Types: 6 Cans of beer per week   Drug use: No     Allergies   Patient has no known allergies.   Review of Systems Review of Systems   Physical Exam Triage Vital Signs ED  Triage Vitals  Enc Vitals Group     BP 08/14/22 1320 (!) 194/74     Pulse Rate 08/14/22 1320 68     Resp 08/14/22 1320 20     Temp 08/14/22 1320 98.7 F (37.1 C)     Temp Source 08/14/22 1320 Oral     SpO2 08/14/22 1320 94 %     Weight --      Height --      Head Circumference --      Peak Flow --      Pain Score 08/14/22 1321 8     Pain Loc --      Pain Edu? --      Excl. in [place]? --    No data found.  Updated Vital Signs BP (!) 194/74   Pulse 68   Temp 98.7 F (37.1 C) (Oral)   Resp 20   SpO2 94%   Visual Acuity Right Eye Distance:   Left Eye Distance:   Bilateral Distance:    Right Eye Near:   Left Eye Near:    Bilateral Near:     Physical Exam Constitutional:      Appearance: She is normal weight.  HENT:     Head: Normocephalic and atraumatic.     Nose: Nose normal.     Mouth/Throat:     Comments: Left front incisor dental decay noted brown discoloration and swelling to gum line Eyes:     Pupils: Pupils are equal, round, and reactive to light.  Cardiovascular:     Rate and Rhythm: Normal rate.  Pulmonary:     Effort: Pulmonary effort is normal.  Musculoskeletal:        General: Normal range of motion.  Skin:    General: Skin is warm.     Capillary Refill: Capillary refill takes less than 2 seconds.  Neurological:     General: No focal deficit present.     Mental Status: She is alert.  Psychiatric:        Mood and Affect: Mood normal.      UC Treatments / Results  Labs (all labs ordered are listed, but only abnormal results are displayed) Labs Reviewed - No data to display  EKG  Radiology No results found.  Procedures Procedures (including critical care time)  Medications Ordered in UC Medications  ibuprofen (ADVIL) tablet 800 mg (800 mg Oral Given 08/14/22 1414)    Initial Impression / Assessment and Plan / UC Course  I have reviewed the triage vital signs and the nursing notes.  Pertinent labs & imaging results that were  available during my care of the patient were reviewed by me and considered in my medical decision making (see chart for details).     Tooth pain Final Clinical Impressions(s) / UC Diagnoses   Final diagnoses:  Tooth abscess     Discharge Instructions      You have left tooth abscess with possible nerve involvement.  You have been prescribed Amoxicillin 500 mg for infection. You were given Ibuprofen 800 mg for the pain in clinic. I will provide you with a prescription of Ibuprofen 800 mg.  Continue to use gabapentin as directed this will help with nerve pain.      ED Prescriptions     Medication Sig Dispense Auth. Provider   amoxicillin (AMOXIL) 500 MG capsule Take 1 capsule (500 mg total) by mouth 2 (two) times daily for 10 days. 20 capsule Dionisio David M, NP   ibuprofen (ADVIL) 800 MG tablet Take 1 tablet (800 mg total) by mouth 3 (three) times daily. 21 tablet Vevelyn Francois, NP      PDMP not reviewed this encounter.   Dionisio David [place], [place] 08/14/22 [phone]

## 2022-08-14 NOTE — ED Triage Notes (Signed)
Pt reports lower left teeth pain x 1 week. Pt has a dentist appointment on 2/20

## 2022-08-20 ENCOUNTER — Telehealth: Payer: Self-pay | Admitting: Emergency Medicine

## 2022-08-20 MED ORDER — AMOXICILLIN 500 MG PO CAPS: 500.0000 mg | ORAL_CAPSULE | Freq: Two times a day (BID) | ORAL | 0 refills | Status: AC

## 2022-08-20 MED ORDER — IBUPROFEN 800 MG PO TABS: 800.0000 mg | ORAL_TABLET | Freq: Three times a day (TID) | ORAL | 0 refills | Status: DC

## 2022-08-20 NOTE — Telephone Encounter (Signed)
Pt called and reported needed abx and ibuprofen sent to [facility] pharmacy in [place]. Prescriptions electronically sent and original pharmacy also contacted.

## 2022-08-25 ENCOUNTER — Telehealth: Payer: Self-pay | Admitting: Orthopedic Surgery

## 2022-08-25 NOTE — Telephone Encounter (Signed)
Tried to return the patient's call, he number she left is not in service.  [phone]

## 2022-09-03 ENCOUNTER — Encounter: Payer: Self-pay | Admitting: Radiology

## 2022-10-17 ENCOUNTER — Emergency Department (HOSPITAL_COMMUNITY): Payer: 59

## 2022-10-17 ENCOUNTER — Inpatient Hospital Stay (HOSPITAL_COMMUNITY)
Admission: EM | Admit: 2022-10-17 | Discharge: 2022-10-20 | DRG: 417 | Disposition: A | Discharge status: Home / Self Care | Payer: 59 | Attending: Internal Medicine | Admitting: Internal Medicine

## 2022-10-17 ENCOUNTER — Other Ambulatory Visit: Payer: Self-pay

## 2022-10-17 ENCOUNTER — Encounter (HOSPITAL_COMMUNITY): Payer: Self-pay | Admitting: Emergency Medicine

## 2022-10-17 DIAGNOSIS — Z8262 Family history of osteoporosis: Secondary | ICD-10-CM | POA: Diagnosis not present

## 2022-10-17 DIAGNOSIS — Z825 Family history of asthma and other chronic lower respiratory diseases: Secondary | ICD-10-CM | POA: Diagnosis not present

## 2022-10-17 DIAGNOSIS — K219 Gastro-esophageal reflux disease without esophagitis: Secondary | ICD-10-CM | POA: Diagnosis present

## 2022-10-17 DIAGNOSIS — R1011 Right upper quadrant pain: Secondary | ICD-10-CM | POA: Diagnosis present

## 2022-10-17 DIAGNOSIS — J449 Chronic obstructive pulmonary disease, unspecified: Secondary | ICD-10-CM | POA: Diagnosis present

## 2022-10-17 DIAGNOSIS — E039 Hypothyroidism, unspecified: Secondary | ICD-10-CM | POA: Diagnosis present

## 2022-10-17 DIAGNOSIS — K5900 Constipation, unspecified: Secondary | ICD-10-CM | POA: Diagnosis present

## 2022-10-17 DIAGNOSIS — N6313 Unspecified lump in the right breast, lower outer quadrant: Secondary | ICD-10-CM | POA: Diagnosis present

## 2022-10-17 DIAGNOSIS — E785 Hyperlipidemia, unspecified: Secondary | ICD-10-CM | POA: Diagnosis present

## 2022-10-17 DIAGNOSIS — Z8249 Family history of ischemic heart disease and other diseases of the circulatory system: Secondary | ICD-10-CM

## 2022-10-17 DIAGNOSIS — N3000 Acute cystitis without hematuria: Secondary | ICD-10-CM

## 2022-10-17 DIAGNOSIS — K2980 Duodenitis without bleeding: Secondary | ICD-10-CM | POA: Diagnosis present

## 2022-10-17 DIAGNOSIS — K7689 Other specified diseases of liver: Secondary | ICD-10-CM | POA: Diagnosis not present

## 2022-10-17 DIAGNOSIS — E876 Hypokalemia: Secondary | ICD-10-CM | POA: Diagnosis not present

## 2022-10-17 DIAGNOSIS — Z79890 Hormone replacement therapy (postmenopausal): Secondary | ICD-10-CM

## 2022-10-17 DIAGNOSIS — E663 Overweight: Secondary | ICD-10-CM | POA: Diagnosis present

## 2022-10-17 DIAGNOSIS — K851 Biliary acute pancreatitis without necrosis or infection: Secondary | ICD-10-CM | POA: Diagnosis present

## 2022-10-17 DIAGNOSIS — G4700 Insomnia, unspecified: Secondary | ICD-10-CM | POA: Diagnosis present

## 2022-10-17 DIAGNOSIS — K8066 Calculus of gallbladder and bile duct with acute and chronic cholecystitis without obstruction: Principal | ICD-10-CM | POA: Diagnosis present

## 2022-10-17 DIAGNOSIS — F17210 Nicotine dependence, cigarettes, uncomplicated: Secondary | ICD-10-CM | POA: Diagnosis present

## 2022-10-17 DIAGNOSIS — K810 Acute cholecystitis: Principal | ICD-10-CM

## 2022-10-17 DIAGNOSIS — Z7983 Long term (current) use of bisphosphonates: Secondary | ICD-10-CM

## 2022-10-17 DIAGNOSIS — Z79899 Other long term (current) drug therapy: Secondary | ICD-10-CM

## 2022-10-17 DIAGNOSIS — Z720 Tobacco use: Secondary | ICD-10-CM | POA: Diagnosis present

## 2022-10-17 DIAGNOSIS — Z7951 Long term (current) use of inhaled steroids: Secondary | ICD-10-CM | POA: Diagnosis not present

## 2022-10-17 DIAGNOSIS — I10 Essential (primary) hypertension: Secondary | ICD-10-CM | POA: Diagnosis present

## 2022-10-17 DIAGNOSIS — K8000 Calculus of gallbladder with acute cholecystitis without obstruction: Secondary | ICD-10-CM | POA: Diagnosis not present

## 2022-10-17 DIAGNOSIS — Z833 Family history of diabetes mellitus: Secondary | ICD-10-CM

## 2022-10-17 DIAGNOSIS — F419 Anxiety disorder, unspecified: Secondary | ICD-10-CM | POA: Diagnosis present

## 2022-10-17 DIAGNOSIS — Z6828 Body mass index (BMI) 28.0-28.9, adult: Secondary | ICD-10-CM | POA: Diagnosis not present

## 2022-10-17 DIAGNOSIS — N6310 Unspecified lump in the right breast, unspecified quadrant: Secondary | ICD-10-CM

## 2022-10-17 LAB — CBC WITH DIFFERENTIAL/PLATELET
Abs Immature Granulocytes: 0.03 10*3/uL (ref 0.00–0.07)
Basophils Absolute: 0 10*3/uL (ref 0.0–0.1)
Basophils Relative: 0 %
Eosinophils Absolute: 0 10*3/uL (ref 0.0–0.5)
Eosinophils Relative: 0 %
HCT: 35.7 % — ABNORMAL LOW (ref 36.0–46.0)
Hemoglobin: 12.2 g/dL (ref 12.0–15.0)
Immature Granulocytes: 0 %
Lymphocytes Relative: 20 %
Lymphs Abs: 2.1 10*3/uL (ref 0.7–4.0)
MCH: 32.4 pg (ref 26.0–34.0)
MCHC: 34.2 g/dL (ref 30.0–36.0)
MCV: 94.7 fL (ref 80.0–100.0)
Monocytes Absolute: 0.7 10*3/uL (ref 0.1–1.0)
Monocytes Relative: 7 %
Neutro Abs: 7.4 10*3/uL (ref 1.7–7.7)
Neutrophils Relative %: 73 %
Platelets: 285 10*3/uL (ref 150–400)
RBC: 3.77 MIL/uL — ABNORMAL LOW (ref 3.87–5.11)
RDW: 12.7 % (ref 11.5–15.5)
WBC: 10.3 10*3/uL (ref 4.0–10.5)
nRBC: 0 % (ref 0.0–0.2)

## 2022-10-17 LAB — LIPASE, BLOOD: Lipase: 303 U/L — ABNORMAL HIGH (ref 11–51)

## 2022-10-17 LAB — URINALYSIS, ROUTINE W REFLEX MICROSCOPIC
Bilirubin Urine: NEGATIVE
Glucose, UA: NEGATIVE mg/dL
Hgb urine dipstick: NEGATIVE
Ketones, ur: NEGATIVE mg/dL
Nitrite: POSITIVE — AB
Protein, ur: NEGATIVE mg/dL
Specific Gravity, Urine: 1.01 (ref 1.005–1.030)
WBC, UA: >50 WBC/hpf (ref 0–5)
pH: 6 (ref 5.0–8.0)

## 2022-10-17 LAB — COMPREHENSIVE METABOLIC PANEL
ALT: 58 U/L — ABNORMAL HIGH (ref 0–44)
AST: 22 U/L (ref 15–41)
Albumin: 3.6 g/dL (ref 3.5–5.0)
Alkaline Phosphatase: 80 U/L (ref 38–126)
Anion gap: 8 (ref 5–15)
BUN: 17 mg/dL (ref 8–23)
CO2: 26 mmol/L (ref 22–32)
Calcium: 9.1 mg/dL (ref 8.9–10.3)
Chloride: 103 mmol/L (ref 98–111)
Creatinine, Ser: 0.82 mg/dL (ref 0.44–1.00)
GFR, Estimated: >60 mL/min (ref >60)
Glucose, Bld: 145 mg/dL — ABNORMAL HIGH (ref 70–99)
Potassium: 4.2 mmol/L (ref 3.5–5.1)
Sodium: 137 mmol/L (ref 135–145)
Total Bilirubin: 0.7 mg/dL (ref 0.3–1.2)
Total Protein: 6.6 g/dL (ref 6.5–8.1)

## 2022-10-17 MED ORDER — SODIUM CHLORIDE 0.9% FLUSH
3.0000 mL | Freq: Two times a day (BID) | INTRAVENOUS | Status: DC
  Administered 2022-10-18 – 2022-10-19 (×3): 3 mL via INTRAVENOUS

## 2022-10-17 MED ORDER — METRONIDAZOLE 500 MG/100ML IV SOLN
500.0000 mg | Freq: Two times a day (BID) | INTRAVENOUS | Status: DC
  Administered 2022-10-17 – 2022-10-20 (×5): 500 mg via INTRAVENOUS
  Filled 2022-10-17 (×5): qty 100

## 2022-10-17 MED ORDER — LEVOTHYROXINE SODIUM 88 MCG PO TABS
88.0000 ug | ORAL_TABLET | Freq: Every day | ORAL | Status: DC
  Administered 2022-10-18 – 2022-10-20 (×2): 88 ug via ORAL
  Filled 2022-10-17 (×2): qty 1

## 2022-10-17 MED ORDER — UMECLIDINIUM BROMIDE 62.5 MCG/ACT IN AEPB
1.0000 | INHALATION_SPRAY | Freq: Every day | RESPIRATORY_TRACT | Status: DC
  Administered 2022-10-18 – 2022-10-20 (×3): 1 via RESPIRATORY_TRACT
  Filled 2022-10-17: qty 7

## 2022-10-17 MED ORDER — BISACODYL 10 MG RE SUPP: 10.0000 mg | Freq: Every day | RECTAL | Status: DC | PRN

## 2022-10-17 MED ORDER — IOHEXOL 300 MG/ML  SOLN
100.0000 mL | Freq: Once | INTRAMUSCULAR | Status: AC | PRN
  Administered 2022-10-17: 100 mL via INTRAVENOUS

## 2022-10-17 MED ORDER — ACETAMINOPHEN 325 MG PO TABS
650.0000 mg | ORAL_TABLET | Freq: Four times a day (QID) | ORAL | Status: DC | PRN
  Administered 2022-10-18 – 2022-10-19 (×3): 650 mg via ORAL
  Filled 2022-10-17 (×3): qty 2

## 2022-10-17 MED ORDER — DIVALPROEX SODIUM 125 MG PO CSDR
125.0000 mg | DELAYED_RELEASE_CAPSULE | Freq: Two times a day (BID) | ORAL | Status: DC
  Administered 2022-10-17 – 2022-10-20 (×6): 125 mg via ORAL
  Filled 2022-10-17 (×6): qty 1

## 2022-10-17 MED ORDER — ONDANSETRON HCL 4 MG PO TABS: 4.0000 mg | ORAL_TABLET | Freq: Four times a day (QID) | ORAL | Status: DC | PRN

## 2022-10-17 MED ORDER — CLONIDINE HCL 0.1 MG PO TABS
0.1000 mg | ORAL_TABLET | Freq: Two times a day (BID) | ORAL | Status: DC
  Administered 2022-10-17 – 2022-10-20 (×6): 0.1 mg via ORAL
  Filled 2022-10-17 (×6): qty 1

## 2022-10-17 MED ORDER — FLUTICASONE FUROATE-VILANTEROL 200-25 MCG/ACT IN AEPB
1.0000 | INHALATION_SPRAY | Freq: Every day | RESPIRATORY_TRACT | Status: DC
  Administered 2022-10-18 – 2022-10-20 (×3): 1 via RESPIRATORY_TRACT
  Filled 2022-10-17: qty 28

## 2022-10-17 MED ORDER — SODIUM CHLORIDE 0.9 % IV SOLN: INTRAVENOUS | Status: DC | PRN

## 2022-10-17 MED ORDER — OXYCODONE HCL 5 MG PO TABS
5.0000 mg | ORAL_TABLET | ORAL | Status: DC | PRN
  Administered 2022-10-17 – 2022-10-20 (×6): 5 mg via ORAL
  Filled 2022-10-17 (×6): qty 1

## 2022-10-17 MED ORDER — ALBUTEROL SULFATE (2.5 MG/3ML) 0.083% IN NEBU: 2.5000 mg | INHALATION_SOLUTION | RESPIRATORY_TRACT | Status: DC | PRN

## 2022-10-17 MED ORDER — PANTOPRAZOLE SODIUM 40 MG IV SOLR
40.0000 mg | Freq: Every day | INTRAVENOUS | Status: DC
  Administered 2022-10-17 – 2022-10-20 (×4): 40 mg via INTRAVENOUS
  Filled 2022-10-17 (×4): qty 10

## 2022-10-17 MED ORDER — SODIUM CHLORIDE 0.9% FLUSH: 3.0000 mL | INTRAVENOUS | Status: DC | PRN

## 2022-10-17 MED ORDER — ONDANSETRON HCL 4 MG/2ML IJ SOLN: 4.0000 mg | Freq: Four times a day (QID) | INTRAMUSCULAR | Status: DC | PRN

## 2022-10-17 MED ORDER — BISACODYL 10 MG RE SUPP
10.0000 mg | Freq: Once | RECTAL | Status: AC
  Administered 2022-10-17: 10 mg via RECTAL
  Filled 2022-10-17: qty 1

## 2022-10-17 MED ORDER — ONDANSETRON HCL 4 MG/2ML IJ SOLN
4.0000 mg | Freq: Once | INTRAMUSCULAR | Status: AC
  Administered 2022-10-17: 4 mg via INTRAVENOUS
  Filled 2022-10-17: qty 2

## 2022-10-17 MED ORDER — SENNOSIDES-DOCUSATE SODIUM 8.6-50 MG PO TABS
2.0000 | ORAL_TABLET | Freq: Two times a day (BID) | ORAL | Status: DC
  Administered 2022-10-17 – 2022-10-19 (×5): 2 via ORAL
  Filled 2022-10-17 (×6): qty 2

## 2022-10-17 MED ORDER — SODIUM CHLORIDE 0.9 % IV SOLN
1.0000 g | Freq: Once | INTRAVENOUS | Status: AC
  Administered 2022-10-17: 1 g via INTRAVENOUS
  Filled 2022-10-17: qty 10

## 2022-10-17 MED ORDER — HEPARIN SODIUM (PORCINE) 5000 UNIT/ML IJ SOLN
5000.0000 [IU] | Freq: Three times a day (TID) | INTRAMUSCULAR | Status: DC
  Administered 2022-10-20: 5000 [IU] via SUBCUTANEOUS
  Filled 2022-10-17: qty 1

## 2022-10-17 MED ORDER — HYDROMORPHONE HCL 1 MG/ML IJ SOLN
1.0000 mg | INTRAMUSCULAR | Status: DC | PRN
  Administered 2022-10-19 – 2022-10-20 (×3): 1 mg via INTRAVENOUS
  Filled 2022-10-17 (×3): qty 1

## 2022-10-17 MED ORDER — GABAPENTIN 300 MG PO CAPS
600.0000 mg | ORAL_CAPSULE | Freq: Three times a day (TID) | ORAL | Status: DC
  Administered 2022-10-17 – 2022-10-20 (×7): 600 mg via ORAL
  Filled 2022-10-17 (×8): qty 2

## 2022-10-17 MED ORDER — VENLAFAXINE HCL ER 75 MG PO CP24
75.0000 mg | ORAL_CAPSULE | Freq: Every day | ORAL | Status: DC
  Administered 2022-10-18 – 2022-10-20 (×3): 75 mg via ORAL
  Filled 2022-10-17 (×3): qty 1

## 2022-10-17 MED ORDER — POLYETHYLENE GLYCOL 3350 17 G PO PACK: 17.0000 g | PACK | Freq: Every day | ORAL | Status: DC | PRN

## 2022-10-17 MED ORDER — TRAZODONE HCL 50 MG PO TABS
50.0000 mg | ORAL_TABLET | Freq: Every evening | ORAL | Status: DC | PRN
Start: 2022-10-17 — End: 2022-10-17

## 2022-10-17 MED ORDER — SODIUM CHLORIDE 0.9 % IV BOLUS
1000.0000 mL | Freq: Once | INTRAVENOUS | Status: AC
  Administered 2022-10-17: 1000 mL via INTRAVENOUS

## 2022-10-17 MED ORDER — SODIUM CHLORIDE 0.9 % IV SOLN
2.0000 g | INTRAVENOUS | Status: DC
  Administered 2022-10-18 – 2022-10-20 (×3): 2 g via INTRAVENOUS
  Filled 2022-10-17 (×3): qty 20

## 2022-10-17 MED ORDER — HYDRALAZINE HCL 20 MG/ML IJ SOLN: 10.0000 mg | Freq: Four times a day (QID) | INTRAMUSCULAR | Status: DC | PRN

## 2022-10-17 MED ORDER — HYDROMORPHONE HCL 1 MG/ML IJ SOLN
1.0000 mg | Freq: Once | INTRAMUSCULAR | Status: AC
  Administered 2022-10-17: 1 mg via INTRAVENOUS
  Filled 2022-10-17: qty 1

## 2022-10-17 MED ORDER — PRAVASTATIN SODIUM 40 MG PO TABS: 40.0000 mg | ORAL_TABLET | Freq: Every day | ORAL | Status: DC

## 2022-10-17 MED ORDER — GABAPENTIN 600 MG PO TABS: 600.0000 mg | ORAL_TABLET | Freq: Three times a day (TID) | ORAL | Status: DC

## 2022-10-17 MED ORDER — SODIUM CHLORIDE 0.9% FLUSH
3.0000 mL | Freq: Two times a day (BID) | INTRAVENOUS | Status: DC
  Administered 2022-10-18 – 2022-10-20 (×4): 3 mL via INTRAVENOUS

## 2022-10-17 MED ORDER — ACETAMINOPHEN 650 MG RE SUPP: 650.0000 mg | Freq: Four times a day (QID) | RECTAL | Status: DC | PRN

## 2022-10-17 MED ORDER — POLYETHYLENE GLYCOL 3350 17 G PO PACK
17.0000 g | PACK | Freq: Two times a day (BID) | ORAL | Status: DC
  Administered 2022-10-17 – 2022-10-19 (×4): 17 g via ORAL
  Filled 2022-10-17 (×6): qty 1

## 2022-10-17 MED ORDER — DEXTROSE-NACL 5-0.45 % IV SOLN: INTRAVENOUS | Status: DC

## 2022-10-17 MED ORDER — MORPHINE SULFATE (PF) 4 MG/ML IV SOLN
4.0000 mg | Freq: Once | INTRAVENOUS | Status: AC
  Administered 2022-10-17: 4 mg via INTRAVENOUS
  Filled 2022-10-17: qty 1

## 2022-10-17 MED ORDER — RAMELTEON 8 MG PO TABS
8.0000 mg | ORAL_TABLET | Freq: Every day | ORAL | Status: DC
  Administered 2022-10-17 – 2022-10-19 (×3): 8 mg via ORAL
  Filled 2022-10-17 (×4): qty 1

## 2022-10-17 NOTE — ED Notes (Signed)
Pt ambulated to the BR with minimal assistance

## 2022-10-17 NOTE — ED Notes (Signed)
Pt aware we need urine sample.  

## 2022-10-17 NOTE — ED Notes (Signed)
Pt is from [facility] [place]

## 2022-10-17 NOTE — ED Provider Notes (Signed)
[place] EMERGENCY DEPARTMENT AT [facility] Provider Note   CSN: [id number] Arrival date & time: 10/17/22  0818     History  Chief Complaint  Patient presents with   Abdominal Pain    Kristin [name] is a 67 y.o. female.  Pt is a 67 yo female with pmhx significant for htn, hypothyroidism, gerd, and anxiety.  Pt has been having worsening abd pain for 2 weeks.  She has been constipated, but that is normal for her.  She denies n/v.  Pain is in the upper portion of her abd.  She denies f/c.       Home Medications Prior to Admission medications   Medication Sig Start Date End Date Taking? Authorizing Provider  albuterol (VENTOLIN HFA) 108 (90 Base) MCG/ACT inhaler SMARTSIG:1 Puff(s) By Mouth Every 6 Hours PRN 03/19/22   [provider]  alendronate (FOSAMAX) 70 MG tablet Take 70 mg by mouth once a week. 08/07/22   [provider]  cloNIDine (CATAPRES) 0.1 MG tablet Take 0.1 mg by mouth 2 (two) times daily. 07/06/22   [provider]  desvenlafaxine (PRISTIQ) 25 MG 24 hr tablet Take 25 mg by mouth daily. 04/16/22   [provider]  gabapentin (NEURONTIN) 600 MG tablet Take 600 mg by mouth 3 (three) times daily. 07/09/22   [provider]  ibuprofen (ADVIL) 800 MG tablet Take 1 tablet (800 mg total) by mouth 3 (three) times daily. 08/20/22   Particia Nearing, PA-C  levothyroxine (SYNTHROID, LEVOTHROID) 125 MCG tablet Take 125 mcg by mouth daily before breakfast.    [provider]  lisinopril (PRINIVIL,ZESTRIL) 20 MG tablet Take 20 mg by mouth daily.    [provider]  omeprazole (PRILOSEC) 20 MG capsule Take 20 mg by mouth daily.    [provider]  pantoprazole (PROTONIX) 40 MG tablet Take 40 mg by mouth daily. 07/06/22   [provider]  pravastatin (PRAVACHOL) 40 MG tablet Take 40 mg by mouth daily. 07/06/22   [provider]  tiZANidine (ZANAFLEX) 4 MG tablet Take 4 mg by mouth at  bedtime.     [provider]  Dwyane Luo 200-62.5-25 MCG/ACT AEPB Inhale 1 puff into the lungs daily. 08/06/22   [provider]      Allergies    Patient has no known allergies.    Review of Systems   Review of Systems  Gastrointestinal:  Positive for abdominal pain.  All other systems reviewed and are negative.   Physical Exam Updated Vital Signs BP (!) 141/70   Pulse 92   Temp 98.5 F (36.9 C) (Oral)   Resp 20   Ht  (1.6 m)   Wt 72.6 kg   SpO2 98%   BMI 28.35 kg/m  Physical Exam Vitals and nursing note reviewed.  Constitutional:      Appearance: She is well-developed.  HENT:     Head: Normocephalic and atraumatic.     Mouth/Throat:     Mouth: Mucous membranes are moist.     Pharynx: Oropharynx is clear.  Eyes:     Extraocular Movements: Extraocular movements intact.     Pupils: Pupils are equal, round, and reactive to light.  Cardiovascular:     Rate and Rhythm: Normal rate and regular rhythm.     Heart sounds: Normal heart sounds.  Abdominal:     General: Abdomen is flat. Bowel sounds are normal.     Palpations: Abdomen is soft.  Tenderness: There is abdominal tenderness in the right upper quadrant and epigastric area.  Skin:    General: Skin is warm.     Capillary Refill: Capillary refill takes less than 2 seconds.  Neurological:     General: No focal deficit present.     Mental Status: She is alert and oriented to person, place, and time.  Psychiatric:        Mood and Affect: Mood normal.        Behavior: Behavior normal.     ED Results / Procedures / Treatments   Labs (all labs ordered are listed, but only abnormal results are displayed) Labs Reviewed  CBC WITH DIFFERENTIAL/PLATELET - Abnormal; Notable for the following components:      Result Value   RBC 3.77 (*)    HCT 35.7 (*)    All other components within normal limits  COMPREHENSIVE METABOLIC PANEL - Abnormal; Notable for the following components:   Glucose,  Bld 145 (*)    ALT 58 (*)    All other components within normal limits  LIPASE, BLOOD - Abnormal; Notable for the following components:   Lipase 303 (*)    All other components within normal limits  URINALYSIS, ROUTINE W REFLEX MICROSCOPIC - Abnormal; Notable for the following components:   APPearance CLOUDY (*)    Nitrite POSITIVE (*)    Leukocytes,Ua LARGE (*)    Bacteria, UA RARE (*)    All other components within normal limits    EKG None  Radiology US Abdomen Limited RUQ (LIVER/GB)  Result Date: 10/17/2022 CLINICAL DATA:  Acute right upper quadrant abdominal pain. EXAM: ULTRASOUND ABDOMEN LIMITED RIGHT UPPER QUADRANT COMPARISON:  CT scan of same day. FINDINGS: Gallbladder: Multiple gallstones are noted, with 1 noted in the neck of the gallbladder. Severe gallbladder wall thickening is noted measuring 12 mm at 1 point. No sonographic Murphy's sign is noted. Common bile duct: Diameter: 8 mm which is mildly dilated. Liver: No focal lesion identified. Within normal limits in parenchymal echogenicity. Portal vein is patent on color Doppler imaging with normal direction of blood flow towards the liver. Other: None. IMPRESSION: Multiple gallstones are noted with at least 1 calculus noted in the neck of the gallbladder. Severe gallbladder wall thickening is noted. These findings are concerning for possible cholecystitis. Mild common bile duct dilatation is noted as described on CT scan of same day. Electronically Signed   By: Lupita Raider M.D.   On: 10/17/2022 12:49   CT ABDOMEN PELVIS W CONTRAST  Result Date: 10/17/2022 CLINICAL DATA:  Acute abdominal pain.  Worsening abdominal pain. EXAM: CT ABDOMEN AND PELVIS WITH CONTRAST TECHNIQUE: Multidetector CT imaging of the abdomen and pelvis was performed using the standard protocol following bolus administration of intravenous contrast. RADIATION DOSE REDUCTION: This exam was performed according to the departmental dose-optimization program which  includes automated exposure control, adjustment of the mA and/or kV according to patient size and/or use of iterative reconstruction technique. CONTRAST:  OMNIPAQUE IOHEXOL 300 MG/ML  SOLN COMPARISON:  None Available. FINDINGS: Lower chest: No acute abnormality. Questionable mass within the lower RIGHT breast, measuring approximately 1 cm greatest dimension (series 2, image 16). Hepatobiliary: No acute or suspicious findings within the liver. Sludge and/or stones are present within the nondistended gallbladder. Diffuse enhancement of the gallbladder walls. No pericholecystic fluid. Common bile duct measures approximately 1 cm diameter. No stone is identified within the common bile duct. Pancreas: Unremarkable. No pancreatic ductal dilatation or surrounding inflammatory changes. Spleen:  Normal in size without focal abnormality. Adrenals/Urinary Tract: Adrenal glands appear normal. Kidneys are unremarkable without suspicious mass, stone or hydronephrosis. No ureteral or bladder calculi are identified. Bladder walls are circumferentially thickened, possibly accentuated to some degree by incomplete bladder distention. Stomach/Bowel: No dilated large or small bowel loops. No evidence of bowel wall inflammation. Appendix appears normal. Scattered diverticulosis of the sigmoid colon but no focal inflammatory changes seen to suggest acute diverticulitis. Stomach is unremarkable, although partially decompressed limiting characterization. Vascular/Lymphatic: Aortic atherosclerosis. No acute-appearing vascular abnormality. Mildly prominent lymph nodes within the porta hepatis and portacaval spaces. No enlarged lymph nodes identified elsewhere in the abdomen or pelvis. Reproductive: Uterus and bilateral adnexa are unremarkable. Other: No free fluid or abscess collection is seen. No free intraperitoneal air. Musculoskeletal: Degenerative spondylosis of the slightly scoliotic thoracolumbar spine. No acute-appearing osseous  abnormality. IMPRESSION: 1. Sludge and/or stones within the nondistended gallbladder. Diffuse enhancement of the gallbladder walls, but no pericholecystic fluid. Recommend right upper quadrant ultrasound to evaluate for acute cholecystitis. 2. Common bile duct measures approximately 1 cm diameter. No stone is identified within the common bile duct. Recommend correlation with LFTs. If LFTs are elevated, would recommend MRCP for further characterization. This CBD dilatation can also be further characterized on the RIGHT upper quadrant ultrasound recommended above. 3. Bladder walls are circumferentially thickened, possibly accentuated to some degree by incomplete bladder distention. Recommend correlation with urinalysis to exclude cystitis. 4. Colonic diverticulosis without evidence of acute diverticulitis. 5. Questionable mass within the lower RIGHT breast, measuring approximately 1 cm greatest dimension. Recommend nonemergent diagnostic mammogram at a Breast Center for further characterization. 6. Mildly prominent lymph nodes within the porta hepatis and portacaval spaces, most likely reactive in nature. Aortic Atherosclerosis (ICD10-I70.0). Electronically Signed   By: Bary Richard M.D.   On: 10/17/2022 10:07   DG Abdomen Acute W/Chest  Result Date: 10/17/2022 CLINICAL DATA:  Acute abdominal pain. EXAM: DG ABDOMEN ACUTE WITH 1 VIEW CHEST COMPARISON:  None Available. FINDINGS: There is no evidence of dilated bowel loops or free intraperitoneal air. No radiopaque calculi or other significant radiographic abnormality is seen. Heart size and mediastinal contours are within normal limits. Both lungs are clear. IMPRESSION: No abnormal bowel dilatation.  No acute cardiopulmonary disease. Electronically Signed   By: Lupita Raider M.D.   On: 10/17/2022 09:12    Procedures Procedures    Medications Ordered in ED Medications  cefTRIAXone (ROCEPHIN) 1 g in sodium chloride 0.9 % 100 mL IVPB (has no administration  in time range)  cefTRIAXone (ROCEPHIN) 2 g in sodium chloride 0.9 % 100 mL IVPB (has no administration in time range)  metroNIDAZOLE (FLAGYL) IVPB 500 mg (has no administration in time range)  pantoprazole (PROTONIX) injection 40 mg (has no administration in time range)  HYDROmorphone (DILAUDID) injection 1 mg (has no administration in time range)  oxyCODONE (Oxy IR/ROXICODONE) immediate release tablet 5 mg (has no administration in time range)  sodium chloride 0.9 % bolus 1,000 mL (1,000 mLs Intravenous Bolus 10/17/22 0842)  morphine (PF) 4 MG/ML injection 4 mg (4 mg Intravenous Given 10/17/22 0845)  ondansetron (ZOFRAN) injection 4 mg (4 mg Intravenous Given 10/17/22 0844)  iohexol (OMNIPAQUE) 300 MG/ML solution 100 mL (100 mLs Intravenous Contrast Given 10/17/22 0944)  HYDROmorphone (DILAUDID) injection 1 mg (1 mg Intravenous Given 10/17/22 0959)  cefTRIAXone (ROCEPHIN) 1 g in sodium chloride 0.9 % 100 mL IVPB (0 g Intravenous Stopped 10/17/22 1122)    ED Course/ Medical Decision Making/ A&P  Medical Decision Making Amount and/or Complexity of Data Reviewed Labs: ordered. Radiology: ordered.  Risk Prescription drug management. Decision regarding hospitalization.   This patient presents to the ED for concern of abd pain, this involves an extensive number of treatment options, and is a complaint that carries with it a high risk of complications and morbidity.  The differential diagnosis includes constipation, cholecystitis, pancreatitis, gastritis   Co morbidities that complicate the patient evaluation  htn, hypothyroidism, gerd, and anxiety   Additional history obtained:  Additional history obtained from epic chart review External records from outside source obtained and reviewed including EMS report   Lab Tests:  I Ordered, and personally interpreted labs.  The pertinent results include:  cbc nl, cmp nl, lip elevated at 303; UA + nitrites/LE and  >50, + yeast   Imaging Studies ordered:  I ordered imaging studies including kub, [place], ct  I independently visualized and interpreted imaging which showed  KUB3: No abnormal bowel dilatation.  No acute cardiopulmonary disease.  CT abd/pelvis: . Sludge and/or stones within the nondistended gallbladder. Diffuse  enhancement of the gallbladder walls, but no pericholecystic fluid.  Recommend right upper quadrant ultrasound to evaluate for acute  cholecystitis.  2. Common bile duct measures approximately 1 cm diameter. No stone  is identified within the common bile duct. Recommend correlation  with LFTs. If LFTs are elevated, would recommend MRCP for further  characterization. This CBD dilatation can also be further  characterized on the RIGHT upper quadrant ultrasound recommended  above.  3. Bladder walls are circumferentially thickened, possibly  accentuated to some degree by incomplete bladder distention.  Recommend correlation with urinalysis to exclude cystitis.  4. Colonic diverticulosis without evidence of acute diverticulitis.  5. Questionable mass within the lower RIGHT breast, measuring  approximately 1 cm greatest dimension. Recommend nonemergent  diagnostic mammogram at a Breast Center for further  characterization.  6. Mildly prominent lymph nodes within the porta hepatis and  portacaval spaces, most likely reactive in nature.    Aortic Atherosclerosis (ICD10-I70.0).  [place]: Multiple gallstones are noted with at least 1 calculus noted in the  neck of the gallbladder. Severe gallbladder wall thickening is  noted. These findings are concerning for possible cholecystitis.    Mild common bile duct dilatation is noted as described on CT scan of  same day.   I agree with the radiologist interpretation   Cardiac Monitoring:  The patient was maintained on a cardiac monitor.  I personally viewed and interpreted the cardiac monitored which showed an underlying rhythm of:  nsr   Medicines ordered and prescription drug management:  I ordered medication including IVFs and morphine/zofran  for sx  Reevaluation of the patient after these medicines showed that the patient improved I have reviewed the patients home medicines and have made adjustments as needed   Test Considered:  ct   Critical Interventions:  Pain control   Consultations Obtained:  I requested consultation with the surgeon (Dr. Henreitta Leber),  and discussed lab and imaging findings as well as pertinent plan -she will see pt in consult.  She recommends treating for cholecystitis and repeating LFTs and lipase in AM.  She will see in consult and requests a hospitalist admit. Pt d/w Dr. Jayme Cloud ([facility]) who will admit   Problem List / ED Course:  Abd pain:  likely due to cholecystitis.  She is at risk for choledocholithiasis, but LFTs are nl now.  Pt given Rocephin.  Dr. Henreitta Leber will see in consult.  Breast mass: Pt has not had a recent mammogram.  She is encouraged to get this scheduled. UTI:  Rocephin given   Reevaluation:  After the interventions noted above, I reevaluated the patient and found that they have :improved   Social Determinants of Health:  Lives at home   Dispostion:  After consideration of the diagnostic results and the patients response to treatment, I feel that the patent would benefit from admission.          Final Clinical Impression(s) / ED Diagnoses Final diagnoses:  Acute cholecystitis  Mass of right breast, unspecified quadrant  Acute cystitis without hematuria    Rx / DC Orders ED Discharge Orders     None         Jacalyn Lefevre, MD 10/17/22 1330

## 2022-10-17 NOTE — ED Notes (Signed)
Patient transported to CT 

## 2022-10-17 NOTE — Progress Notes (Signed)
[facility]  Recommend hospitalist admission. Treat for cholecystitis with antibiotics. Would get LFTs in Am given concern for CBD dilation and potential for choledocholithiasis.   Will see tomorrow.   Algis Greenhouse, MD [facility] [address] [place], [place] [address] [phone] (office)

## 2022-10-17 NOTE — ED Triage Notes (Signed)
Pt arrived via [facility] c/o abdominal pain x 2 weeks that has progressively gotten worse the last 3 days. States "it feels like a belt tightening around her stomach". Also states she normally has to take a laxative in order to have a BM and last BM was Tuesday. Also states it feels like her morning pills are stuck in her throat. Denies N/V

## 2022-10-17 NOTE — H&P (Signed)
Patient Demographics:    Kristin [name], is a 67 y.o. female  MRN: [id number]   DOB - April 30, 1956  Admit Date - 10/17/2022  Outpatient Primary MD for the patient is System, Provider Not In   Assessment & Plan:   Assessment and Plan:  1) calculus cholecystitis--- discussed with general surgeon -CT abdomen and pelvis and right upper quadrant ultrasound findings noted as below - LFTs are not elevated, except for ALT of 58 -Official surgical consult pending -Defer timing of lap chole with IOC to general surgery team -LFTs look reassuring despite stones in the neck of the gallbladder -IV Rocephin and Flagyl ordered -As needed opiates as needed antiemetics, IV fluids -Clear liquid diet for now  2) presumed gallstone pancreatitis--lipase up to 303..   -Clear liquid diet for now -Protonix for presumed reactive duodenitis  3) possible UTI--- CT abdomen and pelvis suggest cystitis, UA suggest cystitis -IV Rocephin as ordered pending culture data  4) abnormal right breast imaging finding--CT abdomen and pelvis shows questionable mass within the right lower breast measuring 1 cm in greatest dimension mammogram recommended -patient made aware and verbalizes understanding of the need to get a mammogram, apparently her PCP ordered a mammogram for this year which she did not get done.  She missed the appointment  5)COPD/tobacco abuse--no acute exacerbation -Bronchodilators as ordered -Not interested in smoking cessation at this time  6) constipation--- acute on chronic..  May worsen with opiates for #1 and #2 above -Laxatives as ordered  7)HTN--continue clonidine, hold lisinopril given contrast exposure --- IV hydralazine as needed elevated BP  8)Hypothyroidism--- levothyroxine as ordered  9) disorder and  anxiety/insomnia --- continue Depakote, substitute Effexor, and continue Rozerem  Status is: Inpatient  Remains inpatient appropriate because:   Dispo: The patient is from: Home--from the [facility]              Anticipated d/c is to: Home--back to the [facility]              Anticipated d/c date is: 3 days              Patient currently is not medically stable to d/c. Barriers: Not Clinically Stable-   With History of - Reviewed by me  Past Medical History:  Diagnosis Date   Anxiety    GERD (gastroesophageal reflux disease)    History of kidney stones    Hypertension    Hypothyroidism    Thyroid disease       Past Surgical History:  Procedure Laterality Date   CARPAL TUNNEL RELEASE Left 10/28/2016   Procedure: CARPAL TUNNEL RELEASE;  Surgeon: Vickki Hearing, MD;  Location: [facility];  Service: Orthopedics;  Laterality: Left;   CESAREAN SECTION     ELBOW SURGERY     right   FINGER SURGERY     left pinky finger   HEMATOMA EVACUATION     left hip   KNEE SURGERY  right   TONSILLECTOMY      Chief Complaint  Patient presents with   Abdominal Pain      HPI:    Kristin [name]  is a 67 y.o. female with past medical history relevant for tobacco abuse, COPD, HTN, HLD, hypothyroidism , chronic constipation GERD and anxiety disorder who presents to the ED with worsening abdominal pain and nausea -She reports worsening abdominal pain over the last couple weeks which intensified over the last 72 hours or so -Last BM 10/13/2022 -Daughter some nausea but no emesis -No fever  Or chills  -No chest pains, no palpitations and no dizziness  -In the ED right upper quadrant ultrasound with multiple gallstones with at least 1 calculus in the neck of the gallbladder, there is severe gallbladder wall thickening consistent with presumed cholecystitis, concerns about mild common bile duct dilatation -CT abdomen and pelvis diffuse enhancement of the gallbladder walls, questionable mass  within the right lower breast measuring 1 cm in greatest dimension mammogram recommended -patient made aware and verbalizes understanding of the need to get a mammogram, apparently her PCP ordered a mammogram for this year which she did not get done.  She missed the appointment --CT abdomen and pelvis also suggest cystitis -UA were suggestive of UTI -Lipase is 303 -Glucose 145 LFTs are not elevated, except for ALT of 58 -Creatinine 0.82 -WBC 10.3 hemoglobin 12.2 and platelets 285   Review of systems:    In addition to the HPI above,   A full Review of  Systems was done, all other systems reviewed are negative except as noted above in HPI , .    Social History:  Reviewed by me    Social History   Tobacco Use   Smoking status: Every Day    Packs/day: 0.25    Years: 30.00    Additional pack years: 0.00    Total pack years: 7.50    Types: Cigarettes   Smokeless tobacco: Never  Substance Use Topics   Alcohol use: Yes    Alcohol/week: 6.0 standard drinks of alcohol    Types: 6 Cans of beer per week       Family History :  Reviewed by me    Family History  Problem Relation Age of Onset   Diabetes Mother    COPD Mother    Heart attack Mother    Diabetes Father    Emphysema Father    Heart attack Father    Kidney disease Maternal Aunt    Osteoporosis Maternal Aunt      Home Medications:   Prior to Admission medications   Medication Sig Start Date End Date Taking? Authorizing Provider  albuterol (VENTOLIN HFA) 108 (90 Base) MCG/ACT inhaler Inhale 2 puffs into the lungs every 8 (eight) hours as needed for wheezing or shortness of breath. 03/19/22  Yes [provider]  alendronate (FOSAMAX) 70 MG tablet Take 70 mg by mouth every Monday. 08/07/22  Yes [provider]  calcium carbonate (TUMS EX) 750 MG chewable tablet Chew 1 tablet by mouth 3 (three) times daily as needed for heartburn (and/or indigestion).   Yes [provider]  cloNIDine  (CATAPRES) 0.1 MG tablet Take 0.1 mg by mouth 2 (two) times daily. 07/06/22  Yes [provider]  cyanocobalamin (VITAMIN B12) 1000 MCG tablet Take 3,000 mcg by mouth daily.   Yes [provider]  desvenlafaxine (PRISTIQ) 50 MG 24 hr tablet Take 50 mg by mouth daily. 04/16/22  Yes [provider]  divalproex (DEPAKOTE SPRINKLE) 125 MG capsule Take 125 mg by mouth 2 (two) times daily.   Yes [provider]  gabapentin (NEURONTIN) 600 MG tablet Take 600 mg by mouth 3 (three) times daily. 07/09/22  Yes [provider]  guaiFENesin (ROBITUSSIN) 100 MG/5ML liquid Take 5 mLs by mouth every 8 (eight) hours as needed for cough or to loosen phlegm.   Yes [provider]  ibuprofen (ADVIL) 800 MG tablet Take 1 tablet (800 mg total) by mouth 3 (three) times daily. Patient taking differently: Take 400 mg by mouth every 6 (six) hours as needed for mild pain or moderate pain. 08/20/22  Yes Particia Nearing, PA-C  levothyroxine (SYNTHROID) 88 MCG tablet Take 88 mcg by mouth daily before breakfast.   Yes [provider]  Liniments (V-R PAIN RELIEVING) GEL Apply 1 Application topically 2 (two) times daily after a meal. Applied to lower back as directed   Yes [provider]  lisinopril (PRINIVIL,ZESTRIL) 20 MG tablet Take 20 mg by mouth daily.   Yes [provider]  nystatin (MYCOSTATIN) 100000 UNIT/ML suspension Take 5 mLs by mouth 4 (four) times daily. 10 DAY COURSE STARTING ON 10/12/22 AND TO BE COMPLETED ON 10/22/2022   Yes [provider]  pantoprazole (PROTONIX) 40 MG tablet Take 40 mg by mouth daily. 07/06/22  Yes [provider]  pravastatin (PRAVACHOL) 40 MG tablet Take 40 mg by mouth daily. 07/06/22  Yes [provider]  psyllium (REGULOID) 0.52 g capsule Take 0.52 g by mouth at bedtime.   Yes [provider]  ramelteon (ROZEREM) 8 MG tablet Take 8 mg by mouth at bedtime.   Yes [provider]  tiZANidine (ZANAFLEX) 4 MG tablet Take 4 mg by mouth every 8 (eight) hours as needed for muscle spasms.   Yes [provider]  Dwyane Luo 200-62.5-25 MCG/ACT AEPB Inhale 1 puff into the lungs daily. *RINSE MOUTH WITH WATER AND SPIT DAILY 08/06/22  Yes [provider]  zolpidem (AMBIEN CR) 12.5 MG CR tablet Take 12.5 mg by mouth at bedtime.   Yes [provider]     Allergies:    No Known Allergies   Physical Exam:   Vitals  Blood pressure 122/65, pulse 99, temperature 98.7 F (37.1 C), resp. rate 20, height  (1.6 m), weight 72.6 kg, SpO2 92 %.  Physical Examination: General appearance - alert,  in no distress  Mental status - alert, oriented to person, place, and time,  Eyes - sclera anicteric Neck - supple, no JVD elevation , Chest - clear  to auscultation bilaterally, symmetrical air movement,  Heart - S1 and S2 normal, regular  Abdomen - soft,  nondistended, +BS, epigastric and RUQ tenderness without rebound or guarding.,  No CVA area tenderness Neurological - screening mental status exam normal, neck supple without rigidity, cranial nerves II through XII intact, DTR's normal and symmetric Extremities - no pedal edema noted, intact peripheral pulses  Skin - warm, dry     Data Review:    CBC Recent Labs  Lab 10/17/22 0841  WBC 10.3  HGB 12.2  HCT 35.7*  PLT 285  MCV 94.7  MCH 32.4  MCHC 34.2  RDW 12.7  LYMPHSABS 2.1  MONOABS 0.7  EOSABS 0.0  BASOSABS 0.0   ------------------------------------------------------------------------------------------------------------------  Chemistries  Recent Labs  Lab 10/17/22 0841  NA 137  K 4.2  CL 103  CO2 26  GLUCOSE 145*  BUN 17  CREATININE 0.82  CALCIUM 9.1  AST 22  ALT 58*  ALKPHOS 80  BILITOT 0.7   ------------------------------------------------------------------------------------------------------------------ estimated creatinine clearance is 65.3 mL/min (by C-G  formula based on SCr of 0.82 mg/dL). ------------------------------------------------------------------------------------------------------------------ -------------------------------------------------------------------------------------------------------------------  Urinalysis    Component Value Date/Time   COLORURINE YELLOW 10/17/2022 0914   APPEARANCEUR CLOUDY (A) 10/17/2022 0914   LABSPEC 1.010 10/17/2022 0914   PHURINE 6.0 10/17/2022 0914   GLUCOSEU NEGATIVE 10/17/2022 0914   HGBUR NEGATIVE 10/17/2022 0914   BILIRUBINUR NEGATIVE 10/17/2022 0914   KETONESUR NEGATIVE 10/17/2022 0914   PROTEINUR NEGATIVE 10/17/2022 0914   NITRITE POSITIVE (A) 10/17/2022 0914   LEUKOCYTESUR LARGE (A) 10/17/2022 0914    ----------------------------------------------------------------------------------------------------------------   Imaging Results:    US Abdomen Limited RUQ (LIVER/GB)  Result Date: 10/17/2022 CLINICAL DATA:  Acute right upper quadrant abdominal pain. EXAM: ULTRASOUND ABDOMEN LIMITED RIGHT UPPER QUADRANT COMPARISON:  CT scan of same day. FINDINGS: Gallbladder: Multiple gallstones are noted, with 1 noted in the neck of the gallbladder. Severe gallbladder wall thickening is noted measuring 12 mm at 1 point. No sonographic Murphy's sign is noted. Common bile duct: Diameter: 8 mm which is mildly dilated. Liver: No focal lesion identified. Within normal limits in parenchymal echogenicity. Portal vein is patent on color Doppler imaging with normal direction of blood flow towards the liver. Other: None. IMPRESSION: Multiple gallstones are noted with at least 1 calculus noted in the neck of the gallbladder. Severe gallbladder wall thickening is noted. These findings are concerning for possible cholecystitis. Mild common bile duct dilatation is noted as described on CT scan of same day. Electronically Signed   By: Lupita Raider M.D.   On: 10/17/2022 12:49   CT ABDOMEN PELVIS W  CONTRAST  Result Date: 10/17/2022 CLINICAL DATA:  Acute abdominal pain.  Worsening abdominal pain. EXAM: CT ABDOMEN AND PELVIS WITH CONTRAST TECHNIQUE: Multidetector CT imaging of the abdomen and pelvis was performed using the standard protocol following bolus administration of intravenous contrast. RADIATION DOSE REDUCTION: This exam was performed according to the departmental dose-optimization program which includes automated exposure control, adjustment of the mA and/or kV according to patient size and/or use of iterative reconstruction technique. CONTRAST:  OMNIPAQUE IOHEXOL 300 MG/ML  SOLN COMPARISON:  None Available. FINDINGS: Lower chest: No acute abnormality. Questionable mass within the lower RIGHT breast, measuring approximately 1 cm greatest dimension (series 2, image 16). Hepatobiliary: No acute or suspicious findings within the liver. Sludge and/or stones are present within the nondistended gallbladder. Diffuse enhancement of the gallbladder walls. No pericholecystic fluid. Common bile duct measures approximately 1 cm diameter. No stone is identified within the common bile duct. Pancreas: Unremarkable. No pancreatic ductal dilatation or surrounding inflammatory changes. Spleen: Normal in size without focal abnormality. Adrenals/Urinary Tract: Adrenal glands appear normal. Kidneys are unremarkable without suspicious mass, stone or hydronephrosis. No ureteral or bladder calculi are identified. Bladder walls are circumferentially thickened, possibly accentuated to some degree by incomplete bladder distention. Stomach/Bowel: No dilated large or small bowel loops. No evidence of bowel wall inflammation. Appendix appears normal. Scattered diverticulosis of the sigmoid colon but no focal inflammatory changes seen to suggest acute diverticulitis. Stomach is unremarkable, although partially decompressed limiting characterization. Vascular/Lymphatic: Aortic atherosclerosis. No acute-appearing vascular  abnormality. Mildly prominent lymph nodes within the porta hepatis and portacaval spaces. No enlarged lymph nodes identified elsewhere in the abdomen or pelvis. Reproductive: Uterus and bilateral adnexa are unremarkable. Other: No free fluid or abscess collection is seen. No free intraperitoneal air. Musculoskeletal: Degenerative spondylosis of  the slightly scoliotic thoracolumbar spine. No acute-appearing osseous abnormality. IMPRESSION: 1. Sludge and/or stones within the nondistended gallbladder. Diffuse enhancement of the gallbladder walls, but no pericholecystic fluid. Recommend right upper quadrant ultrasound to evaluate for acute cholecystitis. 2. Common bile duct measures approximately 1 cm diameter. No stone is identified within the common bile duct. Recommend correlation with LFTs. If LFTs are elevated, would recommend MRCP for further characterization. This CBD dilatation can also be further characterized on the RIGHT upper quadrant ultrasound recommended above. 3. Bladder walls are circumferentially thickened, possibly accentuated to some degree by incomplete bladder distention. Recommend correlation with urinalysis to exclude cystitis. 4. Colonic diverticulosis without evidence of acute diverticulitis. 5. Questionable mass within the lower RIGHT breast, measuring approximately 1 cm greatest dimension. Recommend nonemergent diagnostic mammogram at a [facility] for further characterization. 6. Mildly prominent lymph nodes within the porta hepatis and portacaval spaces, most likely reactive in nature. Aortic Atherosclerosis (ICD10-I70.0). Electronically Signed   By: Bary Richard M.D.   On: 10/17/2022 10:07   DG Abdomen Acute W/Chest  Result Date: 10/17/2022 CLINICAL DATA:  Acute abdominal pain. EXAM: DG ABDOMEN ACUTE WITH 1 VIEW CHEST COMPARISON:  None Available. FINDINGS: There is no evidence of dilated bowel loops or free intraperitoneal air. No radiopaque calculi or other significant radiographic  abnormality is seen. Heart size and mediastinal contours are within normal limits. Both lungs are clear. IMPRESSION: No abnormal bowel dilatation.  No acute cardiopulmonary disease. Electronically Signed   By: Lupita Raider M.D.   On: 10/17/2022 09:12    Radiological Exams on Admission: US Abdomen Limited RUQ (LIVER/GB)  Result Date: 10/17/2022 CLINICAL DATA:  Acute right upper quadrant abdominal pain. EXAM: ULTRASOUND ABDOMEN LIMITED RIGHT UPPER QUADRANT COMPARISON:  CT scan of same day. FINDINGS: Gallbladder: Multiple gallstones are noted, with 1 noted in the neck of the gallbladder. Severe gallbladder wall thickening is noted measuring 12 mm at 1 point. No sonographic Murphy's sign is noted. Common bile duct: Diameter: 8 mm which is mildly dilated. Liver: No focal lesion identified. Within normal limits in parenchymal echogenicity. Portal vein is patent on color Doppler imaging with normal direction of blood flow towards the liver. Other: None. IMPRESSION: Multiple gallstones are noted with at least 1 calculus noted in the neck of the gallbladder. Severe gallbladder wall thickening is noted. These findings are concerning for possible cholecystitis. Mild common bile duct dilatation is noted as described on CT scan of same day. Electronically Signed   By: Lupita Raider M.D.   On: 10/17/2022 12:49   CT ABDOMEN PELVIS W CONTRAST  Result Date: 10/17/2022 CLINICAL DATA:  Acute abdominal pain.  Worsening abdominal pain. EXAM: CT ABDOMEN AND PELVIS WITH CONTRAST TECHNIQUE: Multidetector CT imaging of the abdomen and pelvis was performed using the standard protocol following bolus administration of intravenous contrast. RADIATION DOSE REDUCTION: This exam was performed according to the departmental dose-optimization program which includes automated exposure control, adjustment of the mA and/or kV according to patient size and/or use of iterative reconstruction technique. CONTRAST:  OMNIPAQUE IOHEXOL 300  MG/ML  SOLN COMPARISON:  None Available. FINDINGS: Lower chest: No acute abnormality. Questionable mass within the lower RIGHT breast, measuring approximately 1 cm greatest dimension (series 2, image 16). Hepatobiliary: No acute or suspicious findings within the liver. Sludge and/or stones are present within the nondistended gallbladder. Diffuse enhancement of the gallbladder walls. No pericholecystic fluid. Common bile duct measures approximately 1 cm diameter. No stone is identified within the common bile  duct. Pancreas: Unremarkable. No pancreatic ductal dilatation or surrounding inflammatory changes. Spleen: Normal in size without focal abnormality. Adrenals/Urinary Tract: Adrenal glands appear normal. Kidneys are unremarkable without suspicious mass, stone or hydronephrosis. No ureteral or bladder calculi are identified. Bladder walls are circumferentially thickened, possibly accentuated to some degree by incomplete bladder distention. Stomach/Bowel: No dilated large or small bowel loops. No evidence of bowel wall inflammation. Appendix appears normal. Scattered diverticulosis of the sigmoid colon but no focal inflammatory changes seen to suggest acute diverticulitis. Stomach is unremarkable, although partially decompressed limiting characterization. Vascular/Lymphatic: Aortic atherosclerosis. No acute-appearing vascular abnormality. Mildly prominent lymph nodes within the porta hepatis and portacaval spaces. No enlarged lymph nodes identified elsewhere in the abdomen or pelvis. Reproductive: Uterus and bilateral adnexa are unremarkable. Other: No free fluid or abscess collection is seen. No free intraperitoneal air. Musculoskeletal: Degenerative spondylosis of the slightly scoliotic thoracolumbar spine. No acute-appearing osseous abnormality. IMPRESSION: 1. Sludge and/or stones within the nondistended gallbladder. Diffuse enhancement of the gallbladder walls, but no pericholecystic fluid. Recommend right upper  quadrant ultrasound to evaluate for acute cholecystitis. 2. Common bile duct measures approximately 1 cm diameter. No stone is identified within the common bile duct. Recommend correlation with LFTs. If LFTs are elevated, would recommend MRCP for further characterization. This CBD dilatation can also be further characterized on the RIGHT upper quadrant ultrasound recommended above. 3. Bladder walls are circumferentially thickened, possibly accentuated to some degree by incomplete bladder distention. Recommend correlation with urinalysis to exclude cystitis. 4. Colonic diverticulosis without evidence of acute diverticulitis. 5. Questionable mass within the lower RIGHT breast, measuring approximately 1 cm greatest dimension. Recommend nonemergent diagnostic mammogram at a [facility] Center for further characterization. 6. Mildly prominent lymph nodes within the porta hepatis and portacaval spaces, most likely reactive in nature. Aortic Atherosclerosis (ICD10-I70.0). Electronically Signed   By: Bary Richard M.D.   On: 10/17/2022 10:07   DG Abdomen Acute W/Chest  Result Date: 10/17/2022 CLINICAL DATA:  Acute abdominal pain. EXAM: DG ABDOMEN ACUTE WITH 1 VIEW CHEST COMPARISON:  None Available. FINDINGS: There is no evidence of dilated bowel loops or free intraperitoneal air. No radiopaque calculi or other significant radiographic abnormality is seen. Heart size and mediastinal contours are within normal limits. Both lungs are clear. IMPRESSION: No abnormal bowel dilatation.  No acute cardiopulmonary disease. Electronically Signed   By: Lupita Raider M.D.   On: 10/17/2022 09:12    DVT Prophylaxis -SCD/Heparin AM Labs Ordered, also please review Full Orders  Family Communication: Admission, patients condition and plan of care including tests being ordered have been discussed with the patient who indicate understanding and agree with the plan   Condition  -stable  Shon Hale M.D on 10/17/2022 at 6:53 PM Go  to [email or web address] -  for contact info  [facility] - Office  [phone]

## 2022-10-18 DIAGNOSIS — K8000 Calculus of gallbladder with acute cholecystitis without obstruction: Secondary | ICD-10-CM | POA: Diagnosis not present

## 2022-10-18 DIAGNOSIS — K851 Biliary acute pancreatitis without necrosis or infection: Secondary | ICD-10-CM

## 2022-10-18 LAB — COMPREHENSIVE METABOLIC PANEL
ALT: 135 U/L — ABNORMAL HIGH (ref 0–44)
AST: 79 U/L — ABNORMAL HIGH (ref 15–41)
Albumin: 3.3 g/dL — ABNORMAL LOW (ref 3.5–5.0)
Alkaline Phosphatase: 107 U/L (ref 38–126)
Anion gap: 9 (ref 5–15)
BUN: 11 mg/dL (ref 8–23)
CO2: 27 mmol/L (ref 22–32)
Calcium: 8.2 mg/dL — ABNORMAL LOW (ref 8.9–10.3)
Chloride: 103 mmol/L (ref 98–111)
Creatinine, Ser: 0.91 mg/dL (ref 0.44–1.00)
GFR, Estimated: >60 mL/min (ref >60)
Glucose, Bld: 118 mg/dL — ABNORMAL HIGH (ref 70–99)
Potassium: 3.7 mmol/L (ref 3.5–5.1)
Sodium: 139 mmol/L (ref 135–145)
Total Bilirubin: 0.9 mg/dL (ref 0.3–1.2)
Total Protein: 6.1 g/dL — ABNORMAL LOW (ref 6.5–8.1)

## 2022-10-18 LAB — CBC
HCT: 35.4 % — ABNORMAL LOW (ref 36.0–46.0)
Hemoglobin: 11.5 g/dL — ABNORMAL LOW (ref 12.0–15.0)
MCH: 31.9 pg (ref 26.0–34.0)
MCHC: 32.5 g/dL (ref 30.0–36.0)
MCV: 98.1 fL (ref 80.0–100.0)
Platelets: 257 10*3/uL (ref 150–400)
RBC: 3.61 MIL/uL — ABNORMAL LOW (ref 3.87–5.11)
RDW: 12.8 % (ref 11.5–15.5)
WBC: 7.6 10*3/uL (ref 4.0–10.5)
nRBC: 0 % (ref 0.0–0.2)

## 2022-10-18 LAB — PROTIME-INR
INR: 1.2 (ref 0.8–1.2)
Prothrombin Time: 14.6 seconds (ref 11.4–15.2)

## 2022-10-18 LAB — GLUCOSE, CAPILLARY
Glucose-Capillary: 121 mg/dL — ABNORMAL HIGH (ref 70–99)
Glucose-Capillary: 131 mg/dL — ABNORMAL HIGH (ref 70–99)
Glucose-Capillary: 148 mg/dL — ABNORMAL HIGH (ref 70–99)
Glucose-Capillary: 150 mg/dL — ABNORMAL HIGH (ref 70–99)
Glucose-Capillary: 163 mg/dL — ABNORMAL HIGH (ref 70–99)

## 2022-10-18 LAB — HIV ANTIBODY (ROUTINE TESTING W REFLEX): HIV Screen 4th Generation wRfx: NONREACTIVE

## 2022-10-18 LAB — LIPASE, BLOOD: Lipase: 76 U/L — ABNORMAL HIGH (ref 11–51)

## 2022-10-18 MED ORDER — CHLORHEXIDINE GLUCONATE CLOTH 2 % EX PADS
6.0000 | MEDICATED_PAD | Freq: Once | CUTANEOUS | Status: AC
  Administered 2022-10-19: 6 via TOPICAL

## 2022-10-18 MED ORDER — SODIUM CHLORIDE 0.9 % IV SOLN
2.0000 g | INTRAVENOUS | Status: AC
  Administered 2022-10-19: 2 g via INTRAVENOUS

## 2022-10-18 MED ORDER — CHLORHEXIDINE GLUCONATE CLOTH 2 % EX PADS
6.0000 | MEDICATED_PAD | Freq: Once | CUTANEOUS | Status: AC
  Administered 2022-10-18: 6 via TOPICAL

## 2022-10-18 NOTE — H&P (View-Only) (Signed)
[facility] Consult  Reason for Consult: Acute cholecystitis, gallstone pancreatitis  Referring Physician: Dr. Darnelle Catalan   Chief Complaint   Abdominal Pain     HPI: Kristin [name] is a 67 y.o. female with GERD, HTN, hypothyroidism who comes in with pain that has been going on for a few weeks now off and on but has been worsening the last few days. She has epigastric RUQ pain that has continued. She denies any recent alcohol use but did drink about 12 pack a week in the past, quitting July 2023. She says that she had some changes in her medications few weeks ago but nothing recently. Her lipase was 303 on admission and down to 70s today.     Past Medical History:  Diagnosis Date   Anxiety    GERD (gastroesophageal reflux disease)    History of kidney stones    Hypertension    Hypothyroidism    Thyroid disease     Past Surgical History:  Procedure Laterality Date   CARPAL TUNNEL RELEASE Left 10/28/2016   Procedure: CARPAL TUNNEL RELEASE;  Surgeon: Vickki Hearing, MD;  Location: [facility];  Service: Orthopedics;  Laterality: Left;   CESAREAN SECTION     ELBOW SURGERY     right   FINGER SURGERY     left pinky finger   HEMATOMA EVACUATION     left hip   KNEE SURGERY     right   TONSILLECTOMY      Family History  Problem Relation Age of Onset   Diabetes Mother    COPD Mother    Heart attack Mother    Diabetes Father    Emphysema Father    Heart attack Father    Kidney disease Maternal Aunt    Osteoporosis Maternal Aunt     Social History   Tobacco Use   Smoking status: Every Day    Packs/day: 0.25    Years: 30.00    Additional pack years: 0.00    Total pack years: 7.50    Types: Cigarettes   Smokeless tobacco: Never  Substance Use Topics   Alcohol use: Yes    Alcohol/week: 6.0 standard drinks of alcohol    Types: 6 Cans of beer per week   Drug use: No    Medications: I have reviewed the patient's current medications. Prior to Admission:   Medications Prior to Admission  Medication Sig Dispense Refill Last Dose   albuterol (VENTOLIN HFA) 108 (90 Base) MCG/ACT inhaler Inhale 2 puffs into the lungs every 8 (eight) hours as needed for wheezing or shortness of breath.   Past Month   alendronate (FOSAMAX) 70 MG tablet Take 70 mg by mouth every Monday.   10/12/2022   calcium carbonate (TUMS EX) 750 MG chewable tablet Chew 1 tablet by mouth 3 (three) times daily as needed for heartburn (and/or indigestion).   unk   cloNIDine (CATAPRES) 0.1 MG tablet Take 0.1 mg by mouth 2 (two) times daily.   10/17/2022   cyanocobalamin (VITAMIN B12) 1000 MCG tablet Take 3,000 mcg by mouth daily.   10/17/2022   desvenlafaxine (PRISTIQ) 50 MG 24 hr tablet Take 50 mg by mouth daily.   10/17/2022   divalproex (DEPAKOTE SPRINKLE) 125 MG capsule Take 125 mg by mouth 2 (two) times daily.   10/17/2022   gabapentin (NEURONTIN) 600 MG tablet Take 600 mg by mouth 3 (three) times daily.   10/17/2022   guaiFENesin (ROBITUSSIN) 100 MG/5ML liquid Take 5 mLs by mouth  every 8 (eight) hours as needed for cough or to loosen phlegm.   Past Week   ibuprofen (ADVIL) 800 MG tablet Take 1 tablet (800 mg total) by mouth 3 (three) times daily. (Patient taking differently: Take 400 mg by mouth every 6 (six) hours as needed for mild pain or moderate pain.) 21 tablet 0 UNKNOWN   levothyroxine (SYNTHROID) 88 MCG tablet Take 88 mcg by mouth daily before breakfast.   10/17/2022   Liniments (V-R PAIN RELIEVING) GEL Apply 1 Application topically 2 (two) times daily after a meal. Applied to lower back as directed   10/17/2022   lisinopril (PRINIVIL,ZESTRIL) 20 MG tablet Take 20 mg by mouth daily.   10/17/2022   nystatin (MYCOSTATIN) 100000 UNIT/ML suspension Take 5 mLs by mouth 4 (four) times daily. 10 DAY COURSE STARTING ON 10/12/22 AND TO BE COMPLETED ON 10/22/2022   10/17/2022   pantoprazole (PROTONIX) 40 MG tablet Take 40 mg by mouth daily.   10/17/2022   pravastatin (PRAVACHOL) 40 MG tablet Take 40  mg by mouth daily.   10/16/2022   psyllium (REGULOID) 0.52 g capsule Take 0.52 g by mouth at bedtime.   10/16/2022   ramelteon (ROZEREM) 8 MG tablet Take 8 mg by mouth at bedtime.   10/16/2022   tiZANidine (ZANAFLEX) 4 MG tablet Take 4 mg by mouth every 8 (eight) hours as needed for muscle spasms.   10/16/2022   TRELEGY ELLIPTA 200-62.5-25 MCG/ACT AEPB Inhale 1 puff into the lungs daily. *RINSE MOUTH WITH WATER AND SPIT DAILY   10/17/2022   zolpidem (AMBIEN CR) 12.5 MG CR tablet Take 12.5 mg by mouth at bedtime.   10/16/2022   Scheduled:  Chlorhexidine Gluconate Cloth  6 each Topical Once   And   [START ON 10/19/2022] Chlorhexidine Gluconate Cloth  6 each Topical Once   cloNIDine  0.1 mg Oral BID   divalproex  125 mg Oral BID   fluticasone furoate-vilanterol  1 puff Inhalation Daily   And   umeclidinium bromide  1 puff Inhalation Daily   gabapentin  600 mg Oral TID   [START ON 10/20/2022] heparin  5,000 Units Subcutaneous Q8H   levothyroxine  88 mcg Oral QAC breakfast   pantoprazole (PROTONIX) IV  40 mg Intravenous Daily   polyethylene glycol  17 g Oral BID   ramelteon  8 mg Oral QHS   senna-docusate  2 tablet Oral BID   sodium chloride flush  3 mL Intravenous Q12H   sodium chloride flush  3 mL Intravenous Q12H   venlafaxine XR  75 mg Oral Q breakfast   Continuous:  sodium chloride     [START ON 10/19/2022] cefoTEtan (CEFOTAN) IV     cefTRIAXone (ROCEPHIN)  IV 2 g (10/18/22 0816)   dextrose 5 % and 0.45% NaCl 75 mL/hr at 10/18/22 0259   metronidazole 500 mg (10/18/22 0301)   WUJ:WJXBJY chloride, acetaminophen **OR** acetaminophen, albuterol, bisacodyl, hydrALAZINE, HYDROmorphone (DILAUDID) injection, ondansetron **OR** ondansetron (ZOFRAN) IV, oxyCODONE, polyethylene glycol, sodium chloride flush  No Known Allergies   ROS:  A comprehensive review of systems was negative except for: Gastrointestinal: positive for abdominal pain and nausea  Blood pressure 135/67, pulse 70, temperature  98.6 F (37 C), temperature source Oral, resp. rate 18, height  (1.6 m), weight 72.6 kg, SpO2 91 %. Physical Exam Vitals reviewed.  HENT:     Head: Normocephalic.  Eyes:     Extraocular Movements: Extraocular movements intact.  Cardiovascular:     Rate and Rhythm:  Normal rate.  Pulmonary:     Effort: Pulmonary effort is normal.  Abdominal:     General: There is no distension.     Palpations: Abdomen is soft.     Tenderness: There is abdominal tenderness in the right upper quadrant and epigastric area.  Musculoskeletal:     Comments: Move all extremities   Skin:    General: Skin is warm.  Neurological:     General: No focal deficit present.     Mental Status: She is alert.  Psychiatric:        Mood and Affect: Mood normal.        Behavior: Behavior normal.     Results: Results for orders placed or performed during the hospital encounter of 10/17/22 (from the past 48 hour(s))  CBC with Differential     Status: Abnormal   Collection Time: 10/17/22  8:41 AM  Result Value Ref Range   WBC 10.3 4.0 - 10.5 K/uL   RBC 3.77 (L) 3.87 - 5.11 MIL/uL   Hemoglobin 12.2 12.0 - 15.0 g/dL   HCT 16.1 (L) 09.6 - 04.5 %   MCV 94.7 80.0 - 100.0 fL   MCH 32.4 26.0 - 34.0 pg   MCHC 34.2 30.0 - 36.0 g/dL   RDW 40.9 81.1 - 91.4 %   Platelets 285 150 - 400 K/uL   nRBC 0.0 0.0 - 0.2 %   Neutrophils Relative % 73 %   Neutro Abs 7.4 1.7 - 7.7 K/uL   Lymphocytes Relative 20 %   Lymphs Abs 2.1 0.7 - 4.0 K/uL   Monocytes Relative 7 %   Monocytes Absolute 0.7 0.1 - 1.0 K/uL   Eosinophils Relative 0 %   Eosinophils Absolute 0.0 0.0 - 0.5 K/uL   Basophils Relative 0 %   Basophils Absolute 0.0 0.0 - 0.1 K/uL   Immature Granulocytes 0 %   Abs Immature Granulocytes 0.03 0.00 - 0.07 K/uL    Comment: Performed at [facility], [address], [place], [place] [address]  Comprehensive metabolic panel     Status: Abnormal   Collection Time: 10/17/22  8:41 AM  Result Value Ref Range   Sodium 137  135 - 145 mmol/L   Potassium 4.2 3.5 - 5.1 mmol/L   Chloride 103 98 - 111 mmol/L   CO2 26 22 - 32 mmol/L   Glucose, Bld 145 (H) 70 - 99 mg/dL    Comment: Glucose reference range applies only to samples taken after fasting for at least 8 hours.   BUN 17 8 - 23 mg/dL   Creatinine, Ser 6.21 0.44 - 1.00 mg/dL   Calcium 9.1 8.9 - 30.8 mg/dL   Total Protein 6.6 6.5 - 8.1 g/dL   Albumin 3.6 3.5 - 5.0 g/dL   AST 22 15 - 41 U/L   ALT 58 (H) 0 - 44 U/L   Alkaline Phosphatase 80 38 - 126 U/L   Total Bilirubin 0.7 0.3 - 1.2 mg/dL   GFR, Estimated >65 >78 mL/min    Comment: (NOTE) Calculated using the CKD-EPI Creatinine Equation (2021)    Anion gap 8 5 - 15    Comment: Performed at [facility], [address] [place], [place] [address]  Lipase, blood     Status: Abnormal   Collection Time: 10/17/22  8:41 AM  Result Value Ref Range   Lipase 303 (H) 11 - 51 U/L    Comment: Performed at [facility], [address], [place], [place] [address]  Urinalysis,  Routine w reflex microscopic -Urine, Clean Catch     Status: Abnormal   Collection Time: 10/17/22  9:14 AM  Result Value Ref Range   Color, Urine YELLOW YELLOW   APPearance CLOUDY (A) CLEAR   Specific Gravity, Urine 1.010 1.005 - 1.030   pH 6.0 5.0 - 8.0   Glucose, UA NEGATIVE NEGATIVE mg/dL   Hgb urine dipstick NEGATIVE NEGATIVE   Bilirubin Urine NEGATIVE NEGATIVE   Ketones, ur NEGATIVE NEGATIVE mg/dL   Protein, ur NEGATIVE NEGATIVE mg/dL   Nitrite POSITIVE (A) NEGATIVE   Leukocytes,Ua LARGE (A) NEGATIVE   RBC / HPF 0-5 0 - 5 RBC/hpf   WBC, UA >50 0 - 5 WBC/hpf   Bacteria, UA RARE (A) NONE SEEN   Squamous Epithelial / HPF 0-5 0 - 5 /HPF   WBC Clumps PRESENT    Budding Yeast PRESENT     Comment: Performed at [facility], [address] [place], [place] [address]  Glucose, capillary     Status: Abnormal   Collection Time: 10/18/22 12:03 AM  Result Value Ref Range   Glucose-Capillary 163 (H) 70 - 99 mg/dL    Comment: Glucose  reference range applies only to samples taken after fasting for at least 8 hours.  CBC     Status: Abnormal   Collection Time: 10/18/22  4:37 AM  Result Value Ref Range   WBC 7.6 4.0 - 10.5 K/uL   RBC 3.61 (L) 3.87 - 5.11 MIL/uL   Hemoglobin 11.5 (L) 12.0 - 15.0 g/dL   HCT 60.4 (L) 54.0 - 98.1 %   MCV 98.1 80.0 - 100.0 fL   MCH 31.9 26.0 - 34.0 pg   MCHC 32.5 30.0 - 36.0 g/dL   RDW 19.1 47.8 - 29.5 %   Platelets 257 150 - 400 K/uL   nRBC 0.0 0.0 - 0.2 %    Comment: Performed at [facility], [address] [place], [place] [address]  Comprehensive metabolic panel     Status: Abnormal   Collection Time: 10/18/22  4:37 AM  Result Value Ref Range   Sodium 139 135 - 145 mmol/L   Potassium 3.7 3.5 - 5.1 mmol/L   Chloride 103 98 - 111 mmol/L   CO2 27 22 - 32 mmol/L   Glucose, Bld 118 (H) 70 - 99 mg/dL    Comment: Glucose reference range applies only to samples taken after fasting for at least 8 hours.   BUN 11 8 - 23 mg/dL   Creatinine, Ser 8.65 0.44 - 1.00 mg/dL   Calcium 8.2 (L) 8.9 - 10.3 mg/dL   Total Protein 6.1 (L) 6.5 - 8.1 g/dL   Albumin 3.3 (L) 3.5 - 5.0 g/dL   AST 79 (H) 15 - 41 U/L   ALT 135 (H) 0 - 44 U/L   Alkaline Phosphatase 107 38 - 126 U/L   Total Bilirubin 0.9 0.3 - 1.2 mg/dL   GFR, Estimated >78 >46 mL/min    Comment: (NOTE) Calculated using the CKD-EPI Creatinine Equation (2021)    Anion gap 9 5 - 15    Comment: Performed at [facility], [address], [place], [place] [address]  Protime-INR     Status: None   Collection Time: 10/18/22  4:37 AM  Result Value Ref Range   Prothrombin Time 14.6 11.4 - 15.2 seconds   INR 1.2 0.8 - 1.2    Comment: (NOTE) INR goal varies based on device and disease states. Performed at [facility], [address] [place], [place] [address]   HIV Antibody (routine testing w rflx)     Status: None   Collection Time: 10/18/22  4:37 AM  Result Value Ref Range   HIV Screen 4th Generation wRfx Non Reactive Non Reactive     Comment: Performed at [facility], [address] [place], [place] [address]  Lipase, blood     Status: Abnormal   Collection Time: 10/18/22  4:37 AM  Result Value Ref Range   Lipase 76 (H) 11 - 51 U/L    Comment: Performed at [facility], [address], [place], [place] [address]  Glucose, capillary     Status: Abnormal   Collection Time: 10/18/22  6:04 AM  Result Value Ref Range   Glucose-Capillary 150 (H) 70 - 99 mg/dL    Comment: Glucose reference range applies only to samples taken after fasting for at least 8 hours.  Glucose, capillary     Status: Abnormal   Collection Time: 10/18/22 11:04 AM  Result Value Ref Range   Glucose-Capillary 131 (H) 70 - 99 mg/dL    Comment: Glucose reference range applies only to samples taken after fasting for at least 8 hours.   Personally reviewed and showed the patient- gallstones, some thickening, dilated duct  US Abdomen Limited RUQ (LIVER/GB)  Result Date: 10/17/2022 CLINICAL DATA:  Acute right upper quadrant abdominal pain. EXAM: ULTRASOUND ABDOMEN LIMITED RIGHT UPPER QUADRANT COMPARISON:  CT scan of same day. FINDINGS: Gallbladder: Multiple gallstones are noted, with 1 noted in the neck of the gallbladder. Severe gallbladder wall thickening is noted measuring 12 mm at 1 point. No sonographic Murphy's sign is noted. Common bile duct: Diameter: 8 mm which is mildly dilated. Liver: No focal lesion identified. Within normal limits in parenchymal echogenicity. Portal vein is patent on color Doppler imaging with normal direction of blood flow towards the liver. Other: None. IMPRESSION: Multiple gallstones are noted with at least 1 calculus noted in the neck of the gallbladder. Severe gallbladder wall thickening is noted. These findings are concerning for possible cholecystitis. Mild common bile duct dilatation is noted as described on CT scan of same day. Electronically Signed   By: Lupita Raider M.D.   On: 10/17/2022 12:49   CT ABDOMEN PELVIS W  CONTRAST  Result Date: 10/17/2022 CLINICAL DATA:  Acute abdominal pain.  Worsening abdominal pain. EXAM: CT ABDOMEN AND PELVIS WITH CONTRAST TECHNIQUE: Multidetector CT imaging of the abdomen and pelvis was performed using the standard protocol following bolus administration of intravenous contrast. RADIATION DOSE REDUCTION: This exam was performed according to the departmental dose-optimization program which includes automated exposure control, adjustment of the mA and/or kV according to patient size and/or use of iterative reconstruction technique. CONTRAST:  OMNIPAQUE IOHEXOL 300 MG/ML  SOLN COMPARISON:  None Available. FINDINGS: Lower chest: No acute abnormality. Questionable mass within the lower RIGHT breast, measuring approximately 1 cm greatest dimension (series 2, image 16). Hepatobiliary: No acute or suspicious findings within the liver. Sludge and/or stones are present within the nondistended gallbladder. Diffuse enhancement of the gallbladder walls. No pericholecystic fluid. Common bile duct measures approximately 1 cm diameter. No stone is identified within the common bile duct. Pancreas: Unremarkable. No pancreatic ductal dilatation or surrounding inflammatory changes. Spleen: Normal in size without focal abnormality. Adrenals/Urinary Tract: Adrenal glands appear normal. Kidneys are unremarkable without suspicious mass, stone or hydronephrosis. No ureteral or bladder calculi are identified. Bladder walls are circumferentially thickened, possibly accentuated to some degree by incomplete bladder  distention. Stomach/Bowel: No dilated large or small bowel loops. No evidence of bowel wall inflammation. Appendix appears normal. Scattered diverticulosis of the sigmoid colon but no focal inflammatory changes seen to suggest acute diverticulitis. Stomach is unremarkable, although partially decompressed limiting characterization. Vascular/Lymphatic: Aortic atherosclerosis. No acute-appearing vascular  abnormality. Mildly prominent lymph nodes within the porta hepatis and portacaval spaces. No enlarged lymph nodes identified elsewhere in the abdomen or pelvis. Reproductive: Uterus and bilateral adnexa are unremarkable. Other: No free fluid or abscess collection is seen. No free intraperitoneal air. Musculoskeletal: Degenerative spondylosis of the slightly scoliotic thoracolumbar spine. No acute-appearing osseous abnormality. IMPRESSION: 1. Sludge and/or stones within the nondistended gallbladder. Diffuse enhancement of the gallbladder walls, but no pericholecystic fluid. Recommend right upper quadrant ultrasound to evaluate for acute cholecystitis. 2. Common bile duct measures approximately 1 cm diameter. No stone is identified within the common bile duct. Recommend correlation with LFTs. If LFTs are elevated, would recommend MRCP for further characterization. This CBD dilatation can also be further characterized on the RIGHT upper quadrant ultrasound recommended above. 3. Bladder walls are circumferentially thickened, possibly accentuated to some degree by incomplete bladder distention. Recommend correlation with urinalysis to exclude cystitis. 4. Colonic diverticulosis without evidence of acute diverticulitis. 5. Questionable mass within the lower RIGHT breast, measuring approximately 1 cm greatest dimension. Recommend nonemergent diagnostic mammogram at a Breast Center for further characterization. 6. Mildly prominent lymph nodes within the porta hepatis and portacaval spaces, most likely reactive in nature. Aortic Atherosclerosis (ICD10-I70.0). Electronically Signed   By: Bary Richard M.D.   On: 10/17/2022 10:07   DG Abdomen Acute W/Chest  Result Date: 10/17/2022 CLINICAL DATA:  Acute abdominal pain. EXAM: DG ABDOMEN ACUTE WITH 1 VIEW CHEST COMPARISON:  None Available. FINDINGS: There is no evidence of dilated bowel loops or free intraperitoneal air. No radiopaque calculi or other significant radiographic  abnormality is seen. Heart size and mediastinal contours are within normal limits. Both lungs are clear. IMPRESSION: No abnormal bowel dilatation.  No acute cardiopulmonary disease. Electronically Signed   By: Lupita Raider M.D.   On: 10/17/2022 09:12     Assessment & Plan:  Kristin [name] is a 67 y.o. female with what is likely gallstone pancreatitis and some cholecystitis. She is being treated with antibiotics. LFTs reassuring, lipase better.   PLAN: I counseled the patient about the indication, risks and benefits of laparoscopic cholecystectomy.  She understands there is a very small chance for bleeding, infection, injury to normal structures (including common bile duct), conversion to open surgery, persistent symptoms, evolution of postcholecystectomy diarrhea, need for secondary interventions, anesthesia reaction, cardiopulmonary issues and other risks not specifically detailed here. I described the expected recovery, the plan for follow-up and the restrictions during the recovery phase.  Discussed cholangiogram to look for any stones and discussed inability to get the cholangiogram and potential need for MRCP /ERCP if there is a stone.  All questions were answered.  Preop EKG ordered given age, no cardiac history reported  All questions were answered to the satisfaction of the patient.  NPO midnight Clears today Antibiotics    Lucretia Roers 10/18/2022, 2:08 PM

## 2022-10-18 NOTE — TOC Initial Note (Signed)
Transition of Care [facility]) - Initial/Assessment Note    Patient Details  Name: Kristin [name] MRN: [id number] Date of Birth: 08/23/1955  [facility]) CM/SW Contact:    Catalina Gravel, LCSW Phone Number: 10/18/2022, 4:35 PM  Clinical Narrative:                 Pt is here from [facility]. MD states pt may need surgical procedure. DC 1-2 days expected to return to [facility].     Barriers to Discharge: Continued Medical Work up   Patient Goals and CMS Choice            Expected Discharge Plan and Services                                              Prior Living Arrangements/Services                       Activities of Daily Living Home Assistive Devices/Equipment: Cane (specify quad or straight) ADL Screening (condition at time of admission) Patient's cognitive ability adequate to safely complete daily activities?: Yes Is the patient deaf or have difficulty hearing?: No Does the patient have difficulty seeing, even when wearing glasses/contacts?: No Does the patient have difficulty concentrating, remembering, or making decisions?: No Patient able to express need for assistance with ADLs?: Yes Does the patient have difficulty dressing or bathing?: No Independently performs ADLs?: Yes (appropriate for developmental age) Does the patient have difficulty walking or climbing stairs?: No Weakness of Legs: Both Weakness of Arms/Hands: None  Permission Sought/Granted                  Emotional Assessment              Admission diagnosis:  Acute cholecystitis [K81.0] Cholelithiasis and acute cholecystitis without obstruction [K80.00] Acute cystitis without hematuria [N30.00] Mass of right breast, unspecified quadrant [N63.10] Patient Active Problem List   Diagnosis Date Noted   Gallstone pancreatitis 10/18/2022   Breast mass, right-Needs mammogram 10/17/2022   Cholelithiasis and acute cholecystitis without obstruction  10/17/2022   Hypothyroidism 10/17/2022   COPD without exacerbation 10/17/2022   Tobacco abuse 10/17/2022   HTN (hypertension) 10/17/2022   Constipation 10/17/2022   HLD (hyperlipidemia) 10/17/2022   PCP:  System, Provider Not In Pharmacy:   [facility] - [place], [place] - [address] [place] [address] Phone: [phone] Fax: [phone]  [facility] - [place], [place] - [address] [place] [address] [place] [address] Phone: [phone] Fax: [phone]     Social Determinants of Health (SDOH) Social History: SDOH Screenings   Food Insecurity: No Food Insecurity (10/17/2022)  Housing: Low Risk  (10/17/2022)  Transportation Needs: No Transportation Needs (10/17/2022)  Utilities: Not At Risk (10/17/2022)  Tobacco Use: High Risk (10/17/2022)   SDOH Interventions:     Readmission Risk Interventions     No data to display

## 2022-10-18 NOTE — Progress Notes (Signed)
Progress Note    Kristin [name]  ZOX:[id number] DOB: 11-11-1955  DOA: 10/17/2022 PCP: System, Provider Not In    Brief Narrative:   Chief complaint: Abdominal pain and nausea.  Medical records reviewed and are as summarized below:  Kristin [name] is an 67 y.o. female with a PMH of tobacco abuse, COPD, hypertension, hyperlipidemia, hypothyroidism, chronic constipation, GERD, and anxiety disorder who presented to the ED for evaluation 10/17/2022 of abdominal pain and nausea.  Right upper quadrant ultrasound showed multiple gallstones and cholecystitis.  Lipase elevated at 303.  General surgery consulted with plans to proceed with cholecystectomy 10/19/2022.  Assessment/Plan:   Principal Problem:   Cholelithiasis and acute cholecystitis without obstruction/acute gallstone pancreatitis/possible UTI Surgery consultation pending.  Continue IV Rocephin/Flagyl.  Continue pain and nausea management.  Currently on a clear liquid diet and will be n.p.o. after midnight in anticipation of cholecystectomy planned for/15/24.  Continue Protonix.  Lipase improving.  Noted to have possible UTI as well and current antibiotics will cover this possibility.  Active Problems:   Breast mass, right-Needs mammogram CT of the abdomen and pelvis did show a 1 cm right lower quadrant breast mass that will need mammogram and outpatient follow-up for possible biopsy.  Admitting physician did discuss this with her.  Will need this reinforced at discharge.    COPD without exacerbation in the setting of ongoing tobacco abuse Declined tobacco cessation at this time.  No evidence of acute decompensation.  Bronchodilators ordered as needed.  Continue Breo Ellipta and Incruse Ellipta.    HTN (hypertension) Continue clonidine.  Lisinopril remains on hold.  Also has IV hydralazine if needed.  Blood pressure currently controlled.    Hypothyroidism Continue levothyroxine.       Constipation Monitor bowel function.   Laxatives ordered as needed.    HLD (hyperlipidemia) Pravachol currently on hold given LFT elevation.  Resume at discharge.  Insomnia/anxiety Continue Effexor, Depakote and Rozerem.  Nutritional status  Body mass index is 28.35 kg/m.   Family Communication/Anticipated D/C date and plan/Code Status   DVT prophylaxis: heparin injection 5,000 Units Start: 10/20/22 0600 SCDs Start: 10/17/22 1819 Place TED hose Start: 10/17/22 1819   Current Level of Care:: Level of care: Med-Surg Code Status: Full Code.  Family Communication: No family at the bedside Disposition Plan: Status is: Inpatient Remains inpatient appropriate because: Acute cholecystitis requiring IV antibiotics with plan for cholecystectomy/15/24.     Medical Consultants:   Surgery   Anti-Infectives:   Rocephin Flagyl  Subjective:   Addylynn reports that her pain has improved and that she has tolerated a clear liquid diet.  She denies any nausea or vomiting.  Her bowels moved yesterday.  No current complaints.  Objective:    Vitals:   10/17/22 2204 10/18/22 0230 10/18/22 0619 10/18/22 0725  BP: 128/61 122/61 (!) 132/57   Pulse: 80 76 80   Resp: 20 20 20    Temp: 98.4 F (36.9 C) 99.3 F (37.4 C) 98.7 F (37.1 C)   TempSrc: Oral Oral Oral   SpO2: 92% 94% 92% 93%  Weight:      Height:        Intake/Output Summary (Last 24 hours) at 10/18/2022 0833 Last data filed at 10/18/2022 0552 Gross per 24 hour  Intake 649.77 ml  Output --  Net 649.77 ml   Filed Weights   10/17/22 0826  Weight: 72.6 kg    Exam: General: No acute distress. Cardiovascular: Heart sounds show a regular rate,  and rhythm. No gallops or rubs. No murmurs. No JVD. Lungs: Clear to auscultation bilaterally with good air movement. No rales, rhonchi or wheezes. Abdomen: Soft, tender to deep palpation right upper quadrant, nondistended with normal active bowel sounds. No masses. No hepatosplenomegaly. Neurological: Alert and  oriented 3.  Skin: Warm and dry. No rashes or lesions. Extremities: No clubbing or cyanosis. No edema. Pedal pulses 2+. Psychiatric: Mood and affect are normal. Insight and judgment are good.  Data Reviewed:   I have personally reviewed following labs and imaging studies:  Labs: Labs show the following:   Basic Metabolic Panel: Recent Labs  Lab 10/17/22 0841 10/18/22 0437  NA 137 139  K 4.2 3.7  CL 103 103  CO2 26 27  GLUCOSE 145* 118*  BUN 17 11  CREATININE 0.82 0.91  CALCIUM 9.1 8.2*   GFR Estimated Creatinine Clearance: 58.9 mL/min (by C-G formula based on SCr of 0.91 mg/dL). Liver Function Tests: Recent Labs  Lab 10/17/22 0841 10/18/22 0437  AST 22 79*  ALT 58* 135*  ALKPHOS 80 107  BILITOT 0.7 0.9  PROT 6.6 6.1*  ALBUMIN 3.6 3.3*   Recent Labs  Lab 10/17/22 0841 10/18/22 0437  LIPASE 303* 76*   Coagulation profile Recent Labs  Lab 10/18/22 0437  INR 1.2    CBC: Recent Labs  Lab 10/17/22 0841 10/18/22 0437  WBC 10.3 7.6  NEUTROABS 7.4  --   HGB 12.2 11.5*  HCT 35.7* 35.4*  MCV 94.7 98.1  PLT 285 257   CBG: Recent Labs  Lab 10/18/22 0003 10/18/22 0604  GLUCAP 163* 150*    Microbiology No results found for this or any previous visit (from the past 240 hour(s)).  Procedures and diagnostic studies:  US Abdomen Limited RUQ (LIVER/GB)  Result Date: 10/17/2022 CLINICAL DATA:  Acute right upper quadrant abdominal pain. EXAM: ULTRASOUND ABDOMEN LIMITED RIGHT UPPER QUADRANT COMPARISON:  CT scan of same day. FINDINGS: Gallbladder: Multiple gallstones are noted, with 1 noted in the neck of the gallbladder. Severe gallbladder wall thickening is noted measuring 12 mm at 1 point. No sonographic Murphy's sign is noted. Common bile duct: Diameter: 8 mm which is mildly dilated. Liver: No focal lesion identified. Within normal limits in parenchymal echogenicity. Portal vein is patent on color Doppler imaging with normal direction of blood flow towards  the liver. Other: None. IMPRESSION: Multiple gallstones are noted with at least 1 calculus noted in the neck of the gallbladder. Severe gallbladder wall thickening is noted. These findings are concerning for possible cholecystitis. Mild common bile duct dilatation is noted as described on CT scan of same day. Electronically Signed   By: Lupita Raider M.D.   On: 10/17/2022 12:49   CT ABDOMEN PELVIS W CONTRAST  Result Date: 10/17/2022 CLINICAL DATA:  Acute abdominal pain.  Worsening abdominal pain. EXAM: CT ABDOMEN AND PELVIS WITH CONTRAST TECHNIQUE: Multidetector CT imaging of the abdomen and pelvis was performed using the standard protocol following bolus administration of intravenous contrast. RADIATION DOSE REDUCTION: This exam was performed according to the departmental dose-optimization program which includes automated exposure control, adjustment of the mA and/or kV according to patient size and/or use of iterative reconstruction technique. CONTRAST:  OMNIPAQUE IOHEXOL 300 MG/ML  SOLN COMPARISON:  None Available. FINDINGS: Lower chest: No acute abnormality. Questionable mass within the lower RIGHT breast, measuring approximately 1 cm greatest dimension (series 2, image 16). Hepatobiliary: No acute or suspicious findings within the liver. Sludge and/or stones are present within  the nondistended gallbladder. Diffuse enhancement of the gallbladder walls. No pericholecystic fluid. Common bile duct measures approximately 1 cm diameter. No stone is identified within the common bile duct. Pancreas: Unremarkable. No pancreatic ductal dilatation or surrounding inflammatory changes. Spleen: Normal in size without focal abnormality. Adrenals/Urinary Tract: Adrenal glands appear normal. Kidneys are unremarkable without suspicious mass, stone or hydronephrosis. No ureteral or bladder calculi are identified. Bladder walls are circumferentially thickened, possibly accentuated to some degree by incomplete bladder  distention. Stomach/Bowel: No dilated large or small bowel loops. No evidence of bowel wall inflammation. Appendix appears normal. Scattered diverticulosis of the sigmoid colon but no focal inflammatory changes seen to suggest acute diverticulitis. Stomach is unremarkable, although partially decompressed limiting characterization. Vascular/Lymphatic: Aortic atherosclerosis. No acute-appearing vascular abnormality. Mildly prominent lymph nodes within the porta hepatis and portacaval spaces. No enlarged lymph nodes identified elsewhere in the abdomen or pelvis. Reproductive: Uterus and bilateral adnexa are unremarkable. Other: No free fluid or abscess collection is seen. No free intraperitoneal air. Musculoskeletal: Degenerative spondylosis of the slightly scoliotic thoracolumbar spine. No acute-appearing osseous abnormality. IMPRESSION: 1. Sludge and/or stones within the nondistended gallbladder. Diffuse enhancement of the gallbladder walls, but no pericholecystic fluid. Recommend right upper quadrant ultrasound to evaluate for acute cholecystitis. 2. Common bile duct measures approximately 1 cm diameter. No stone is identified within the common bile duct. Recommend correlation with LFTs. If LFTs are elevated, would recommend MRCP for further characterization. This CBD dilatation can also be further characterized on the RIGHT upper quadrant ultrasound recommended above. 3. Bladder walls are circumferentially thickened, possibly accentuated to some degree by incomplete bladder distention. Recommend correlation with urinalysis to exclude cystitis. 4. Colonic diverticulosis without evidence of acute diverticulitis. 5. Questionable mass within the lower RIGHT breast, measuring approximately 1 cm greatest dimension. Recommend nonemergent diagnostic mammogram at a [facility] for further characterization. 6. Mildly prominent lymph nodes within the porta hepatis and portacaval spaces, most likely reactive in nature.  Aortic Atherosclerosis (ICD10-I70.0). Electronically Signed   By: Bary Richard M.D.   On: 10/17/2022 10:07   DG Abdomen Acute W/Chest  Result Date: 10/17/2022 CLINICAL DATA:  Acute abdominal pain. EXAM: DG ABDOMEN ACUTE WITH 1 VIEW CHEST COMPARISON:  None Available. FINDINGS: There is no evidence of dilated bowel loops or free intraperitoneal air. No radiopaque calculi or other significant radiographic abnormality is seen. Heart size and mediastinal contours are within normal limits. Both lungs are clear. IMPRESSION: No abnormal bowel dilatation.  No acute cardiopulmonary disease. Electronically Signed   By: Lupita Raider M.D.   On: 10/17/2022 09:12    Medications:    cloNIDine  0.1 mg Oral BID   divalproex  125 mg Oral BID   fluticasone furoate-vilanterol  1 puff Inhalation Daily   And   umeclidinium bromide  1 puff Inhalation Daily   gabapentin  600 mg Oral TID   [START ON 10/20/2022] heparin  5,000 Units Subcutaneous Q8H   levothyroxine  88 mcg Oral QAC breakfast   pantoprazole (PROTONIX) IV  40 mg Intravenous Daily   polyethylene glycol  17 g Oral BID   ramelteon  8 mg Oral QHS   senna-docusate  2 tablet Oral BID   sodium chloride flush  3 mL Intravenous Q12H   sodium chloride flush  3 mL Intravenous Q12H   venlafaxine XR  75 mg Oral Q breakfast   Continuous Infusions:  sodium chloride     cefTRIAXone (ROCEPHIN)  IV 2 g (10/18/22 0816)   dextrose 5 %  and 0.45% NaCl 75 mL/hr at 10/18/22 0259   metronidazole 500 mg (10/18/22 0301)     LOS: 1 day   Hillery Aldo, MD  [facility]   [facility] How to contact the [facility] Attending or Consulting provider 7A - 7P or covering provider during after hours 7P -7A, for this patient?  Check the care team in [facility] and look for a) attending/consulting [facility] provider listed and b) the [facility] team listed Log into [email or web address] and use [place]'s universal password to access. If you do not have the password, please contact the  hospital operator. Locate the [facility] provider you are looking for under [facility] and page to a number that you can be directly reached. If you still have difficulty reaching the provider, please page the [facility] (Director on Call) for the Hospitalists listed on amion for assistance.  10/18/2022, 8:33 AM

## 2022-10-18 NOTE — Consult Note (Signed)
[facility] Consult  Reason for Consult: Acute cholecystitis, gallstone pancreatitis  Referring Physician: Dr. Rama   Chief Complaint   Abdominal Pain     HPI: Kristin [name] is a 66 y.o. female with GERD, HTN, hypothyroidism who comes in with pain that has been going on for a few weeks now off and on but has been worsening the last few days. She has epigastric RUQ pain that has continued. She denies any recent alcohol use but did drink about 12 pack a week in the past, quitting July 2023. She says that she had some changes in her medications few weeks ago but nothing recently. Her lipase was 303 on admission and down to 70s today.     Past Medical History:  Diagnosis Date   Anxiety    GERD (gastroesophageal reflux disease)    History of kidney stones    Hypertension    Hypothyroidism    Thyroid disease     Past Surgical History:  Procedure Laterality Date   CARPAL TUNNEL RELEASE Left 10/28/2016   Procedure: CARPAL TUNNEL RELEASE;  Surgeon: Stanley E Harrison, MD;  Location: [facility];  Service: Orthopedics;  Laterality: Left;   CESAREAN SECTION     ELBOW SURGERY     right   FINGER SURGERY     left pinky finger   HEMATOMA EVACUATION     left hip   KNEE SURGERY     right   TONSILLECTOMY      Family History  Problem Relation Age of Onset   Diabetes Mother    COPD Mother    Heart attack Mother    Diabetes Father    Emphysema Father    Heart attack Father    Kidney disease Maternal Aunt    Osteoporosis Maternal Aunt     Social History   Tobacco Use   Smoking status: Every Day    Packs/day: 0.25    Years: 30.00    Additional pack years: 0.00    Total pack years: 7.50    Types: Cigarettes   Smokeless tobacco: Never  Substance Use Topics   Alcohol use: Yes    Alcohol/week: 6.0 standard drinks of alcohol    Types: 6 Cans of beer per week   Drug use: No    Medications: I have reviewed the patient's current medications. Prior to Admission:   Medications Prior to Admission  Medication Sig Dispense Refill Last Dose   albuterol (VENTOLIN HFA) 108 (90 Base) MCG/ACT inhaler Inhale 2 puffs into the lungs every 8 (eight) hours as needed for wheezing or shortness of breath.   Past Month   alendronate (FOSAMAX) 70 MG tablet Take 70 mg by mouth every Monday.   10/12/2022   calcium carbonate (TUMS EX) 750 MG chewable tablet Chew 1 tablet by mouth 3 (three) times daily as needed for heartburn (and/or indigestion).   unk   cloNIDine (CATAPRES) 0.1 MG tablet Take 0.1 mg by mouth 2 (two) times daily.   10/17/2022   cyanocobalamin (VITAMIN B12) 1000 MCG tablet Take 3,000 mcg by mouth daily.   10/17/2022   desvenlafaxine (PRISTIQ) 50 MG 24 hr tablet Take 50 mg by mouth daily.   10/17/2022   divalproex (DEPAKOTE SPRINKLE) 125 MG capsule Take 125 mg by mouth 2 (two) times daily.   10/17/2022   gabapentin (NEURONTIN) 600 MG tablet Take 600 mg by mouth 3 (three) times daily.   10/17/2022   guaiFENesin (ROBITUSSIN) 100 MG/5ML liquid Take 5 mLs by mouth   every 8 (eight) hours as needed for cough or to loosen phlegm.   Past Week   ibuprofen (ADVIL) 800 MG tablet Take 1 tablet (800 mg total) by mouth 3 (three) times daily. (Patient taking differently: Take 400 mg by mouth every 6 (six) hours as needed for mild pain or moderate pain.) 21 tablet 0 UNKNOWN   levothyroxine (SYNTHROID) 88 MCG tablet Take 88 mcg by mouth daily before breakfast.   10/17/2022   Liniments (V-R PAIN RELIEVING) GEL Apply 1 Application topically 2 (two) times daily after a meal. Applied to lower back as directed   10/17/2022   lisinopril (PRINIVIL,ZESTRIL) 20 MG tablet Take 20 mg by mouth daily.   10/17/2022   nystatin (MYCOSTATIN) 100000 UNIT/ML suspension Take 5 mLs by mouth 4 (four) times daily. 10 DAY COURSE STARTING ON 10/12/22 AND TO BE COMPLETED ON 10/22/2022   10/17/2022   pantoprazole (PROTONIX) 40 MG tablet Take 40 mg by mouth daily.   10/17/2022   pravastatin (PRAVACHOL) 40 MG tablet Take 40  mg by mouth daily.   10/16/2022   psyllium (REGULOID) 0.52 g capsule Take 0.52 g by mouth at bedtime.   10/16/2022   ramelteon (ROZEREM) 8 MG tablet Take 8 mg by mouth at bedtime.   10/16/2022   tiZANidine (ZANAFLEX) 4 MG tablet Take 4 mg by mouth every 8 (eight) hours as needed for muscle spasms.   10/16/2022   TRELEGY ELLIPTA 200-62.5-25 MCG/ACT AEPB Inhale 1 puff into the lungs daily. *RINSE MOUTH WITH WATER AND SPIT DAILY   10/17/2022   zolpidem (AMBIEN CR) 12.5 MG CR tablet Take 12.5 mg by mouth at bedtime.   10/16/2022   Scheduled:  Chlorhexidine Gluconate Cloth  6 each Topical Once   And   [START ON 10/19/2022] Chlorhexidine Gluconate Cloth  6 each Topical Once   cloNIDine  0.1 mg Oral BID   divalproex  125 mg Oral BID   fluticasone furoate-vilanterol  1 puff Inhalation Daily   And   umeclidinium bromide  1 puff Inhalation Daily   gabapentin  600 mg Oral TID   [START ON 10/20/2022] heparin  5,000 Units Subcutaneous Q8H   levothyroxine  88 mcg Oral QAC breakfast   pantoprazole (PROTONIX) IV  40 mg Intravenous Daily   polyethylene glycol  17 g Oral BID   ramelteon  8 mg Oral QHS   senna-docusate  2 tablet Oral BID   sodium chloride flush  3 mL Intravenous Q12H   sodium chloride flush  3 mL Intravenous Q12H   venlafaxine XR  75 mg Oral Q breakfast   Continuous:  sodium chloride     [START ON 10/19/2022] cefoTEtan (CEFOTAN) IV     cefTRIAXone (ROCEPHIN)  IV 2 g (10/18/22 0816)   dextrose 5 % and 0.45% NaCl 75 mL/hr at 10/18/22 0259   metronidazole 500 mg (10/18/22 0301)   PRN:sodium chloride, acetaminophen **OR** acetaminophen, albuterol, bisacodyl, hydrALAZINE, HYDROmorphone (DILAUDID) injection, ondansetron **OR** ondansetron (ZOFRAN) IV, oxyCODONE, polyethylene glycol, sodium chloride flush  No Known Allergies   ROS:  A comprehensive review of systems was negative except for: Gastrointestinal: positive for abdominal pain and nausea  Blood pressure 135/67, pulse 70, temperature  98.6 F (37 C), temperature source Oral, resp. rate 18, height 5' 3" (1.6 m), weight 72.6 kg, SpO2 91 %. Physical Exam Vitals reviewed.  HENT:     Head: Normocephalic.  Eyes:     Extraocular Movements: Extraocular movements intact.  Cardiovascular:     Rate and Rhythm:   Normal rate.  Pulmonary:     Effort: Pulmonary effort is normal.  Abdominal:     General: There is no distension.     Palpations: Abdomen is soft.     Tenderness: There is abdominal tenderness in the right upper quadrant and epigastric area.  Musculoskeletal:     Comments: Move all extremities   Skin:    General: Skin is warm.  Neurological:     General: No focal deficit present.     Mental Status: She is alert.  Psychiatric:        Mood and Affect: Mood normal.        Behavior: Behavior normal.     Results: Results for orders placed or performed during the hospital encounter of 10/17/22 (from the past 48 hour(s))  CBC with Differential     Status: Abnormal   Collection Time: 10/17/22  8:41 AM  Result Value Ref Range   WBC 10.3 4.0 - 10.5 K/uL   RBC 3.77 (L) 3.87 - 5.11 MIL/uL   Hemoglobin 12.2 12.0 - 15.0 g/dL   HCT 35.7 (L) 36.0 - 46.0 %   MCV 94.7 80.0 - 100.0 fL   MCH 32.4 26.0 - 34.0 pg   MCHC 34.2 30.0 - 36.0 g/dL   RDW 12.7 11.5 - 15.5 %   Platelets 285 150 - 400 K/uL   nRBC 0.0 0.0 - 0.2 %   Neutrophils Relative % 73 %   Neutro Abs 7.4 1.7 - 7.7 K/uL   Lymphocytes Relative 20 %   Lymphs Abs 2.1 0.7 - 4.0 K/uL   Monocytes Relative 7 %   Monocytes Absolute 0.7 0.1 - 1.0 K/uL   Eosinophils Relative 0 %   Eosinophils Absolute 0.0 0.0 - 0.5 K/uL   Basophils Relative 0 %   Basophils Absolute 0.0 0.0 - 0.1 K/uL   Immature Granulocytes 0 %   Abs Immature Granulocytes 0.03 0.00 - 0.07 K/uL    Comment: Performed at [facility], [address] [place], [place] [address]  Comprehensive metabolic panel     Status: Abnormal   Collection Time: 10/17/22  8:41 AM  Result Value Ref Range   Sodium 137  135 - 145 mmol/L   Potassium 4.2 3.5 - 5.1 mmol/L   Chloride 103 98 - 111 mmol/L   CO2 26 22 - 32 mmol/L   Glucose, Bld 145 (H) 70 - 99 mg/dL    Comment: Glucose reference range applies only to samples taken after fasting for at least 8 hours.   BUN 17 8 - 23 mg/dL   Creatinine, Ser 0.82 0.44 - 1.00 mg/dL   Calcium 9.1 8.9 - 10.3 mg/dL   Total Protein 6.6 6.5 - 8.1 g/dL   Albumin 3.6 3.5 - 5.0 g/dL   AST 22 15 - 41 U/L   ALT 58 (H) 0 - 44 U/L   Alkaline Phosphatase 80 38 - 126 U/L   Total Bilirubin 0.7 0.3 - 1.2 mg/dL   GFR, Estimated >60 >60 mL/min    Comment: (NOTE) Calculated using the CKD-EPI Creatinine Equation (2021)    Anion gap 8 5 - 15    Comment: Performed at [facility], [address] [place], [place] [address]  Lipase, blood     Status: Abnormal   Collection Time: 10/17/22  8:41 AM  Result Value Ref Range   Lipase 303 (H) 11 - 51 U/L    Comment: Performed at [facility], [address] [place], [place] [address]  Urinalysis,   Routine w reflex microscopic -Urine, Clean Catch     Status: Abnormal   Collection Time: 10/17/22  9:14 AM  Result Value Ref Range   Color, Urine YELLOW YELLOW   APPearance CLOUDY (A) CLEAR   Specific Gravity, Urine 1.010 1.005 - 1.030   pH 6.0 5.0 - 8.0   Glucose, UA NEGATIVE NEGATIVE mg/dL   Hgb urine dipstick NEGATIVE NEGATIVE   Bilirubin Urine NEGATIVE NEGATIVE   Ketones, ur NEGATIVE NEGATIVE mg/dL   Protein, ur NEGATIVE NEGATIVE mg/dL   Nitrite POSITIVE (A) NEGATIVE   Leukocytes,Ua LARGE (A) NEGATIVE   RBC / HPF 0-5 0 - 5 RBC/hpf   WBC, UA >50 0 - 5 WBC/hpf   Bacteria, UA RARE (A) NONE SEEN   Squamous Epithelial / HPF 0-5 0 - 5 /HPF   WBC Clumps PRESENT    Budding Yeast PRESENT     Comment: Performed at [facility], [address] [place], [place] [address]  Glucose, capillary     Status: Abnormal   Collection Time: 10/18/22 12:03 AM  Result Value Ref Range   Glucose-Capillary 163 (H) 70 - 99 mg/dL    Comment: Glucose  reference range applies only to samples taken after fasting for at least 8 hours.  CBC     Status: Abnormal   Collection Time: 10/18/22  4:37 AM  Result Value Ref Range   WBC 7.6 4.0 - 10.5 K/uL   RBC 3.61 (L) 3.87 - 5.11 MIL/uL   Hemoglobin 11.5 (L) 12.0 - 15.0 g/dL   HCT 35.4 (L) 36.0 - 46.0 %   MCV 98.1 80.0 - 100.0 fL   MCH 31.9 26.0 - 34.0 pg   MCHC 32.5 30.0 - 36.0 g/dL   RDW 12.8 11.5 - 15.5 %   Platelets 257 150 - 400 K/uL   nRBC 0.0 0.0 - 0.2 %    Comment: Performed at [facility], [address] [place], [place] [address]  Comprehensive metabolic panel     Status: Abnormal   Collection Time: 10/18/22  4:37 AM  Result Value Ref Range   Sodium 139 135 - 145 mmol/L   Potassium 3.7 3.5 - 5.1 mmol/L   Chloride 103 98 - 111 mmol/L   CO2 27 22 - 32 mmol/L   Glucose, Bld 118 (H) 70 - 99 mg/dL    Comment: Glucose reference range applies only to samples taken after fasting for at least 8 hours.   BUN 11 8 - 23 mg/dL   Creatinine, Ser 0.91 0.44 - 1.00 mg/dL   Calcium 8.2 (L) 8.9 - 10.3 mg/dL   Total Protein 6.1 (L) 6.5 - 8.1 g/dL   Albumin 3.3 (L) 3.5 - 5.0 g/dL   AST 79 (H) 15 - 41 U/L   ALT 135 (H) 0 - 44 U/L   Alkaline Phosphatase 107 38 - 126 U/L   Total Bilirubin 0.9 0.3 - 1.2 mg/dL   GFR, Estimated >60 >60 mL/min    Comment: (NOTE) Calculated using the CKD-EPI Creatinine Equation (2021)    Anion gap 9 5 - 15    Comment: Performed at [facility], [address] [place], [place] [address]  Protime-INR     Status: None   Collection Time: 10/18/22  4:37 AM  Result Value Ref Range   Prothrombin Time 14.6 11.4 - 15.2 seconds   INR 1.2 0.8 - 1.2    Comment: (NOTE) INR goal varies based on device and disease states. Performed at [facility], [address] [place], [place] [address]   HIV Antibody (routine testing w rflx)     Status: None   Collection Time: 10/18/22  4:37 AM  Result Value Ref Range   HIV Screen 4th Generation wRfx Non Reactive Non Reactive     Comment: Performed at [facility], [address] [place], [place] [address]  Lipase, blood     Status: Abnormal   Collection Time: 10/18/22  4:37 AM  Result Value Ref Range   Lipase 76 (H) 11 - 51 U/L    Comment: Performed at [facility], [address] [place], [place] [address]  Glucose, capillary     Status: Abnormal   Collection Time: 10/18/22  6:04 AM  Result Value Ref Range   Glucose-Capillary 150 (H) 70 - 99 mg/dL    Comment: Glucose reference range applies only to samples taken after fasting for at least 8 hours.  Glucose, capillary     Status: Abnormal   Collection Time: 10/18/22 11:04 AM  Result Value Ref Range   Glucose-Capillary 131 (H) 70 - 99 mg/dL    Comment: Glucose reference range applies only to samples taken after fasting for at least 8 hours.   Personally reviewed and showed the patient- gallstones, some thickening, dilated duct  US Abdomen Limited RUQ (LIVER/GB)  Result Date: 10/17/2022 CLINICAL DATA:  Acute right upper quadrant abdominal pain. EXAM: ULTRASOUND ABDOMEN LIMITED RIGHT UPPER QUADRANT COMPARISON:  CT scan of same day. FINDINGS: Gallbladder: Multiple gallstones are noted, with 1 noted in the neck of the gallbladder. Severe gallbladder wall thickening is noted measuring 12 mm at 1 point. No sonographic Murphy's sign is noted. Common bile duct: Diameter: 8 mm which is mildly dilated. Liver: No focal lesion identified. Within normal limits in parenchymal echogenicity. Portal vein is patent on color Doppler imaging with normal direction of blood flow towards the liver. Other: None. IMPRESSION: Multiple gallstones are noted with at least 1 calculus noted in the neck of the gallbladder. Severe gallbladder wall thickening is noted. These findings are concerning for possible cholecystitis. Mild common bile duct dilatation is noted as described on CT scan of same day. Electronically Signed   By: James  Green Jr M.D.   On: 10/17/2022 12:49   CT ABDOMEN PELVIS W  CONTRAST  Result Date: 10/17/2022 CLINICAL DATA:  Acute abdominal pain.  Worsening abdominal pain. EXAM: CT ABDOMEN AND PELVIS WITH CONTRAST TECHNIQUE: Multidetector CT imaging of the abdomen and pelvis was performed using the standard protocol following bolus administration of intravenous contrast. RADIATION DOSE REDUCTION: This exam was performed according to the departmental dose-optimization program which includes automated exposure control, adjustment of the mA and/or kV according to patient size and/or use of iterative reconstruction technique. CONTRAST:  100mL OMNIPAQUE IOHEXOL 300 MG/ML  SOLN COMPARISON:  None Available. FINDINGS: Lower chest: No acute abnormality. Questionable mass within the lower RIGHT breast, measuring approximately 1 cm greatest dimension (series 2, image 16). Hepatobiliary: No acute or suspicious findings within the liver. Sludge and/or stones are present within the nondistended gallbladder. Diffuse enhancement of the gallbladder walls. No pericholecystic fluid. Common bile duct measures approximately 1 cm diameter. No stone is identified within the common bile duct. Pancreas: Unremarkable. No pancreatic ductal dilatation or surrounding inflammatory changes. Spleen: Normal in size without focal abnormality. Adrenals/Urinary Tract: Adrenal glands appear normal. Kidneys are unremarkable without suspicious mass, stone or hydronephrosis. No ureteral or bladder calculi are identified. Bladder walls are circumferentially thickened, possibly accentuated to some degree by incomplete bladder   distention. Stomach/Bowel: No dilated large or small bowel loops. No evidence of bowel wall inflammation. Appendix appears normal. Scattered diverticulosis of the sigmoid colon but no focal inflammatory changes seen to suggest acute diverticulitis. Stomach is unremarkable, although partially decompressed limiting characterization. Vascular/Lymphatic: Aortic atherosclerosis. No acute-appearing vascular  abnormality. Mildly prominent lymph nodes within the porta hepatis and portacaval spaces. No enlarged lymph nodes identified elsewhere in the abdomen or pelvis. Reproductive: Uterus and bilateral adnexa are unremarkable. Other: No free fluid or abscess collection is seen. No free intraperitoneal air. Musculoskeletal: Degenerative spondylosis of the slightly scoliotic thoracolumbar spine. No acute-appearing osseous abnormality. IMPRESSION: 1. Sludge and/or stones within the nondistended gallbladder. Diffuse enhancement of the gallbladder walls, but no pericholecystic fluid. Recommend right upper quadrant ultrasound to evaluate for acute cholecystitis. 2. Common bile duct measures approximately 1 cm diameter. No stone is identified within the common bile duct. Recommend correlation with LFTs. If LFTs are elevated, would recommend MRCP for further characterization. This CBD dilatation can also be further characterized on the RIGHT upper quadrant ultrasound recommended above. 3. Bladder walls are circumferentially thickened, possibly accentuated to some degree by incomplete bladder distention. Recommend correlation with urinalysis to exclude cystitis. 4. Colonic diverticulosis without evidence of acute diverticulitis. 5. Questionable mass within the lower RIGHT breast, measuring approximately 1 cm greatest dimension. Recommend nonemergent diagnostic mammogram at a Breast Center for further characterization. 6. Mildly prominent lymph nodes within the porta hepatis and portacaval spaces, most likely reactive in nature. Aortic Atherosclerosis (ICD10-I70.0). Electronically Signed   By: Stan  Maynard M.D.   On: 10/17/2022 10:07   DG Abdomen Acute W/Chest  Result Date: 10/17/2022 CLINICAL DATA:  Acute abdominal pain. EXAM: DG ABDOMEN ACUTE WITH 1 VIEW CHEST COMPARISON:  None Available. FINDINGS: There is no evidence of dilated bowel loops or free intraperitoneal air. No radiopaque calculi or other significant radiographic  abnormality is seen. Heart size and mediastinal contours are within normal limits. Both lungs are clear. IMPRESSION: No abnormal bowel dilatation.  No acute cardiopulmonary disease. Electronically Signed   By: James  Green Jr M.D.   On: 10/17/2022 09:12     Assessment & Plan:  Kristin [name] is a 66 y.o. female with what is likely gallstone pancreatitis and some cholecystitis. She is being treated with antibiotics. LFTs reassuring, lipase better.   PLAN: I counseled the patient about the indication, risks and benefits of laparoscopic cholecystectomy.  She understands there is a very small chance for bleeding, infection, injury to normal structures (including common bile duct), conversion to open surgery, persistent symptoms, evolution of postcholecystectomy diarrhea, need for secondary interventions, anesthesia reaction, cardiopulmonary issues and other risks not specifically detailed here. I described the expected recovery, the plan for follow-up and the restrictions during the recovery phase.  Discussed cholangiogram to look for any stones and discussed inability to get the cholangiogram and potential need for MRCP /ERCP if there is a stone.  All questions were answered.  Preop EKG ordered given age, no cardiac history reported  All questions were answered to the satisfaction of the patient.  NPO midnight Clears today Antibiotics    Melquan Ernsberger C Tramond Slinker 10/18/2022, 2:08 PM       

## 2022-10-19 ENCOUNTER — Encounter (HOSPITAL_COMMUNITY)
Admission: EM | Disposition: A | Discharge status: Home / Self Care | Payer: Self-pay | Source: Home / Self Care | Attending: Internal Medicine

## 2022-10-19 ENCOUNTER — Inpatient Hospital Stay (HOSPITAL_COMMUNITY): Payer: 59 | Admitting: Certified Registered Nurse Anesthetist

## 2022-10-19 ENCOUNTER — Inpatient Hospital Stay (HOSPITAL_COMMUNITY): Payer: 59

## 2022-10-19 ENCOUNTER — Encounter (HOSPITAL_COMMUNITY): Payer: Self-pay | Admitting: Family Medicine

## 2022-10-19 ENCOUNTER — Other Ambulatory Visit: Payer: Self-pay

## 2022-10-19 DIAGNOSIS — K851 Biliary acute pancreatitis without necrosis or infection: Secondary | ICD-10-CM | POA: Diagnosis not present

## 2022-10-19 DIAGNOSIS — K7689 Other specified diseases of liver: Secondary | ICD-10-CM

## 2022-10-19 DIAGNOSIS — K8000 Calculus of gallbladder with acute cholecystitis without obstruction: Secondary | ICD-10-CM

## 2022-10-19 DIAGNOSIS — J449 Chronic obstructive pulmonary disease, unspecified: Secondary | ICD-10-CM

## 2022-10-19 DIAGNOSIS — I10 Essential (primary) hypertension: Secondary | ICD-10-CM

## 2022-10-19 DIAGNOSIS — F17210 Nicotine dependence, cigarettes, uncomplicated: Secondary | ICD-10-CM

## 2022-10-19 HISTORY — PX: CHOLECYSTECTOMY: SHX55

## 2022-10-19 HISTORY — PX: LIVER BIOPSY: SHX301

## 2022-10-19 LAB — COMPREHENSIVE METABOLIC PANEL
ALT: 80 U/L — ABNORMAL HIGH (ref 0–44)
AST: 24 U/L (ref 15–41)
Albumin: 2.9 g/dL — ABNORMAL LOW (ref 3.5–5.0)
Alkaline Phosphatase: 91 U/L (ref 38–126)
Anion gap: 5 (ref 5–15)
BUN: 8 mg/dL (ref 8–23)
CO2: 27 mmol/L (ref 22–32)
Calcium: 7.8 mg/dL — ABNORMAL LOW (ref 8.9–10.3)
Chloride: 107 mmol/L (ref 98–111)
Creatinine, Ser: 0.75 mg/dL (ref 0.44–1.00)
GFR, Estimated: >60 mL/min (ref >60)
Glucose, Bld: 136 mg/dL — ABNORMAL HIGH (ref 70–99)
Potassium: 3.4 mmol/L — ABNORMAL LOW (ref 3.5–5.1)
Sodium: 139 mmol/L (ref 135–145)
Total Bilirubin: 0.3 mg/dL (ref 0.3–1.2)
Total Protein: 5.6 g/dL — ABNORMAL LOW (ref 6.5–8.1)

## 2022-10-19 LAB — CBC
HCT: 32.6 % — ABNORMAL LOW (ref 36.0–46.0)
Hemoglobin: 10.5 g/dL — ABNORMAL LOW (ref 12.0–15.0)
MCH: 31.7 pg (ref 26.0–34.0)
MCHC: 32.2 g/dL (ref 30.0–36.0)
MCV: 98.5 fL (ref 80.0–100.0)
Platelets: 216 10*3/uL (ref 150–400)
RBC: 3.31 MIL/uL — ABNORMAL LOW (ref 3.87–5.11)
RDW: 12.5 % (ref 11.5–15.5)
WBC: 4.8 10*3/uL (ref 4.0–10.5)
nRBC: 0 % (ref 0.0–0.2)

## 2022-10-19 LAB — URINE CULTURE: Culture: NO GROWTH

## 2022-10-19 LAB — MRSA NEXT GEN BY PCR, NASAL: MRSA by PCR Next Gen: NOT DETECTED

## 2022-10-19 LAB — GLUCOSE, CAPILLARY
Glucose-Capillary: 142 mg/dL — ABNORMAL HIGH (ref 70–99)
Glucose-Capillary: 127 mg/dL — ABNORMAL HIGH (ref 70–99)

## 2022-10-19 SURGERY — LAPAROSCOPIC CHOLECYSTECTOMY WITH INTRAOPERATIVE CHOLANGIOGRAM
Anesthesia: General | Site: Abdomen

## 2022-10-19 MED ORDER — PROPOFOL 10 MG/ML IV BOLUS
INTRAVENOUS | Status: DC | PRN
  Administered 2022-10-19: 150 mg via INTRAVENOUS

## 2022-10-19 MED ORDER — FENTANYL CITRATE (PF) 250 MCG/5ML IJ SOLN
INTRAMUSCULAR | Status: DC | PRN
  Administered 2022-10-19 (×4): 50 ug via INTRAVENOUS

## 2022-10-19 MED ORDER — CHLORHEXIDINE GLUCONATE 0.12 % MT SOLN
15.0000 mL | Freq: Once | OROMUCOSAL | Status: AC
  Administered 2022-10-19: 15 mL via OROMUCOSAL

## 2022-10-19 MED ORDER — ROCURONIUM BROMIDE 10 MG/ML (PF) SYRINGE
PREFILLED_SYRINGE | INTRAVENOUS | Status: AC
  Filled 2022-10-19: qty 10

## 2022-10-19 MED ORDER — BUPIVACAINE HCL (PF) 0.5 % IJ SOLN
INTRAMUSCULAR | Status: DC | PRN
  Administered 2022-10-19: 30 mL

## 2022-10-19 MED ORDER — ROCURONIUM BROMIDE 10 MG/ML (PF) SYRINGE
PREFILLED_SYRINGE | INTRAVENOUS | Status: DC | PRN
  Administered 2022-10-19: 50 mg via INTRAVENOUS

## 2022-10-19 MED ORDER — SODIUM CHLORIDE 0.9 % IV SOLN
INTRAVENOUS | Status: AC
  Filled 2022-10-19: qty 2

## 2022-10-19 MED ORDER — SEVOFLURANE IN SOLN
RESPIRATORY_TRACT | Status: AC
  Filled 2022-10-19: qty 250

## 2022-10-19 MED ORDER — HYDRALAZINE HCL 20 MG/ML IJ SOLN
INTRAMUSCULAR | Status: DC | PRN
  Administered 2022-10-19 (×4): 5 mg via INTRAVENOUS

## 2022-10-19 MED ORDER — LIDOCAINE 2% (20 MG/ML) 5 ML SYRINGE
INTRAMUSCULAR | Status: DC | PRN
  Administered 2022-10-19: 50 mg via INTRAVENOUS

## 2022-10-19 MED ORDER — FENTANYL CITRATE (PF) 100 MCG/2ML IJ SOLN
INTRAMUSCULAR | Status: AC
  Filled 2022-10-19: qty 2

## 2022-10-19 MED ORDER — POTASSIUM CHLORIDE CRYS ER 20 MEQ PO TBCR
40.0000 meq | EXTENDED_RELEASE_TABLET | Freq: Once | ORAL | Status: AC
  Administered 2022-10-19: 40 meq via ORAL
  Filled 2022-10-19: qty 2

## 2022-10-19 MED ORDER — GLYCOPYRROLATE PF 0.2 MG/ML IJ SOSY
PREFILLED_SYRINGE | INTRAMUSCULAR | Status: DC | PRN
  Administered 2022-10-19: .6 mg via INTRAVENOUS

## 2022-10-19 MED ORDER — DEXAMETHASONE SODIUM PHOSPHATE 10 MG/ML IJ SOLN
INTRAMUSCULAR | Status: AC
  Filled 2022-10-19: qty 1

## 2022-10-19 MED ORDER — MIDAZOLAM HCL 5 MG/5ML IJ SOLN
INTRAMUSCULAR | Status: DC | PRN
  Administered 2022-10-19: 2 mg via INTRAVENOUS

## 2022-10-19 MED ORDER — BUPIVACAINE HCL (PF) 0.5 % IJ SOLN
INTRAMUSCULAR | Status: AC
  Filled 2022-10-19: qty 30

## 2022-10-19 MED ORDER — DEXAMETHASONE SODIUM PHOSPHATE 10 MG/ML IJ SOLN
INTRAMUSCULAR | Status: DC | PRN
  Administered 2022-10-19: 10 mg via INTRAVENOUS

## 2022-10-19 MED ORDER — ONDANSETRON HCL 4 MG/2ML IJ SOLN
INTRAMUSCULAR | Status: DC | PRN
  Administered 2022-10-19: 4 mg via INTRAVENOUS

## 2022-10-19 MED ORDER — ONDANSETRON HCL 4 MG/2ML IJ SOLN
INTRAMUSCULAR | Status: AC
  Filled 2022-10-19: qty 2

## 2022-10-19 MED ORDER — SODIUM CHLORIDE (PF) 0.9 % IJ SOLN
INTRAMUSCULAR | Status: DC | PRN
  Administered 2022-10-19: 100 mL

## 2022-10-19 MED ORDER — DIPHENHYDRAMINE HCL 50 MG/ML IJ SOLN
INTRAMUSCULAR | Status: DC | PRN
  Administered 2022-10-19: 12.5 mg via INTRAVENOUS

## 2022-10-19 MED ORDER — NEOSTIGMINE METHYLSULFATE 3 MG/3ML IV SOSY
PREFILLED_SYRINGE | INTRAVENOUS | Status: AC
  Filled 2022-10-19: qty 3

## 2022-10-19 MED ORDER — MEPERIDINE HCL 50 MG/ML IJ SOLN: 6.2500 mg | INTRAMUSCULAR | Status: DC | PRN

## 2022-10-19 MED ORDER — HEMOSTATIC AGENTS (NO CHARGE) OPTIME
TOPICAL | Status: DC | PRN
  Administered 2022-10-19: 1
  Administered 2022-10-19: 2

## 2022-10-19 MED ORDER — GLYCOPYRROLATE PF 0.2 MG/ML IJ SOSY
PREFILLED_SYRINGE | INTRAMUSCULAR | Status: AC
  Filled 2022-10-19: qty 3

## 2022-10-19 MED ORDER — NEOSTIGMINE METHYLSULFATE 10 MG/10ML IV SOLN
INTRAVENOUS | Status: DC | PRN
  Administered 2022-10-19: 3.5 mg via INTRAVENOUS

## 2022-10-19 MED ORDER — LACTATED RINGERS IV SOLN: INTRAVENOUS | Status: DC

## 2022-10-19 MED ORDER — HYDRALAZINE HCL 20 MG/ML IJ SOLN
INTRAMUSCULAR | Status: AC
  Filled 2022-10-19: qty 1

## 2022-10-19 MED ORDER — PROPOFOL 10 MG/ML IV BOLUS
INTRAVENOUS | Status: AC
  Filled 2022-10-19: qty 20

## 2022-10-19 MED ORDER — LIDOCAINE HCL (PF) 2 % IJ SOLN
INTRAMUSCULAR | Status: AC
  Filled 2022-10-19: qty 5

## 2022-10-19 MED ORDER — HYDROMORPHONE HCL 1 MG/ML IJ SOLN: 0.2500 mg | INTRAMUSCULAR | Status: DC | PRN

## 2022-10-19 MED ORDER — ORAL CARE MOUTH RINSE: 15.0000 mL | Freq: Once | OROMUCOSAL | Status: AC

## 2022-10-19 MED ORDER — MIDAZOLAM HCL 2 MG/2ML IJ SOLN
INTRAMUSCULAR | Status: AC
  Filled 2022-10-19: qty 2

## 2022-10-19 MED ORDER — 0.9 % SODIUM CHLORIDE (POUR BTL) OPTIME
TOPICAL | Status: DC | PRN
  Administered 2022-10-19: 1000 mL

## 2022-10-19 SURGICAL SUPPLY — 46 items
APPLICATOR CHLORAPREP 10.5 ORG (MISCELLANEOUS) IMPLANT
APPLIER CLIP ROT 10 11.4 M/L (STAPLE) ×2
BLADE SURG 15 STRL LF DISP TIS (BLADE) ×2 IMPLANT
BLADE SURG 15 STRL SS (BLADE) ×2
CATH CHOLANGIOGRAM 4.5FR (CATHETERS) IMPLANT
CHLORAPREP W/TINT 26 (MISCELLANEOUS) ×2 IMPLANT
CLIP APPLIE ROT 10 11.4 M/L (STAPLE) ×2 IMPLANT
CLOTH BEACON ORANGE TIMEOUT ST (SAFETY) ×2 IMPLANT
COVER LIGHT HANDLE STERIS (MISCELLANEOUS) ×4 IMPLANT
DECANTER SPIKE VIAL GLASS SM (MISCELLANEOUS) ×4 IMPLANT
DERMABOND ADVANCED .7 DNX12 (GAUZE/BANDAGES/DRESSINGS) ×2 IMPLANT
DRAPE C-ARM FOLDED MOBILE STRL (DRAPES) ×2 IMPLANT
DRAPE HALF SHEET 40X57 (DRAPES) IMPLANT
ELECT REM PT RETURN 9FT ADLT (ELECTROSURGICAL) ×2
ELECTRODE REM PT RTRN 9FT ADLT (ELECTROSURGICAL) ×2 IMPLANT
GLOVE BIO SURGEON STRL SZ 6.5 (GLOVE) ×2 IMPLANT
GLOVE BIO SURGEON STRL SZ7 (GLOVE) IMPLANT
GLOVE BIOGEL PI IND STRL 6.5 (GLOVE) ×2 IMPLANT
GLOVE BIOGEL PI IND STRL 7.0 (GLOVE) ×4 IMPLANT
GLOVE ECLIPSE 6.5 STRL STRAW (GLOVE) IMPLANT
GOWN STRL REUS W/TWL LRG LVL3 (GOWN DISPOSABLE) ×6 IMPLANT
HEMOSTAT SNOW SURGICEL 2X4 (HEMOSTASIS) ×2 IMPLANT
INST SET LAPROSCOPIC AP (KITS) ×2 IMPLANT
KIT TURNOVER KIT A (KITS) ×2 IMPLANT
MANIFOLD NEPTUNE II (INSTRUMENTS) ×2 IMPLANT
NEEDLE INSUFFLATION 120MM (ENDOMECHANICALS) ×2 IMPLANT
NS IRRIG 1000ML POUR BTL (IV SOLUTION) ×2 IMPLANT
PACK LAP CHOLE LZT030E (CUSTOM PROCEDURE TRAY) ×2 IMPLANT
PAD ARMBOARD 7.5X6 YLW CONV (MISCELLANEOUS) ×2 IMPLANT
PAD TELFA 3X4 1S STER (GAUZE/BANDAGES/DRESSINGS) IMPLANT
POWDER SURGICEL 3.0 GRAM (HEMOSTASIS) IMPLANT
SET BASIN LINEN APH (SET/KITS/TRAYS/PACK) ×2 IMPLANT
SET TUBE SMOKE EVAC HIGH FLOW (TUBING) ×2 IMPLANT
SLEEVE Z-THREAD 5X100MM (TROCAR) ×2 IMPLANT
SUT MNCRL AB 4-0 PS2 18 (SUTURE) ×4 IMPLANT
SUT VICRYL 0 UR6 27IN ABS (SUTURE) ×2 IMPLANT
SYR 30ML LL (SYRINGE) ×6 IMPLANT
SYR CONTROL 10ML LL (SYRINGE) ×2 IMPLANT
SYS BAG RETRIEVAL 10MM (BASKET) ×2
SYSTEM BAG RETRIEVAL 10MM (BASKET) ×2 IMPLANT
TIP ENDOSCOPIC SURGICEL (TIP) IMPLANT
TROCAR Z-THRD FIOS HNDL 11X100 (TROCAR) ×2 IMPLANT
TROCAR Z-THREAD FIOS 5X100MM (TROCAR) ×2 IMPLANT
TROCAR Z-THREAD SLEEVE 11X100 (TROCAR) ×2 IMPLANT
TUBE CONNECTING 12X1/4 (SUCTIONS) ×2 IMPLANT
WARMER LAPAROSCOPE (MISCELLANEOUS) ×2 IMPLANT

## 2022-10-19 NOTE — Progress Notes (Signed)
[facility]  Updated the [facility] per the patient's request. Will stay overnight for monitoring. Check labs.  Hopefully back in next 24-48 hours.  Algis Greenhouse, MD [facility] [address] [place], [place] [address] [phone] (office)

## 2022-10-19 NOTE — TOC Progression Note (Addendum)
Transition of Care [facility]) - Progression Note    Patient Details  Name: Kristin [name] MRN: [id number] Date of Birth: 24-Jun-1956  [facility] CM/SW Contact  Karn [place], [place] Phone Number: 10/19/2022, 9:16 AM  Clinical Narrative: TOC followed up with Chasity at [facility]. She reports pt has been a resident at ALF for almost a year. She is fairly independent with ADLs and occasionally uses cane. Pt is okay to return. Chasity will notify TOC if assessment is needed prior to d/c. LCSW confirmed plan with pt to return to [facility]. FL2 started.   Update: Chasity will assess pt either today or tomorrow morning.     Barriers to Discharge: Continued Medical Work up  Expected Discharge Plan and Services                                               Social Determinants of Health (SDOH) Interventions SDOH Screenings   Food Insecurity: No Food Insecurity (10/17/2022)  Housing: Low Risk  (10/17/2022)  Transportation Needs: No Transportation Needs (10/17/2022)  Utilities: Not At Risk (10/17/2022)  Tobacco Use: High Risk (10/17/2022)    Readmission Risk Interventions     No data to display

## 2022-10-19 NOTE — Transfer of Care (Signed)
Immediate Anesthesia Transfer of Care Note  Patient: Kristin [name]  Procedure(s) Performed: LAPAROSCOPIC CHOLECYSTECTOMY WITH INTRAOPERATIVE CHOLANGIOGRAM (Abdomen) LIVER BIOPSY (Abdomen)  Patient Location: PACU  Anesthesia Type:General  Level of Consciousness: awake, alert , and oriented  Airway & Oxygen Therapy: Patient Spontanous Breathing and Patient connected to face mask oxygen  Post-op Assessment: Report given to RN, Post -op Vital signs reviewed and stable, Patient moving all extremities X 4, and Patient able to stick tongue midline  Post vital signs: Reviewed  Last Vitals:  Vitals Value Taken Time  BP 169/90 10/19/22 1509  Temp 97.6   Pulse 72 10/19/22 1509  Resp 20 10/19/22 1509  SpO2 99 % 10/19/22 1509  Vitals shown include unvalidated device data.  Last Pain:  Vitals:   10/19/22 1054  TempSrc: Oral  PainSc: 0-No pain      Patients Stated Pain Goal: 0 (10/18/22 2039)  Complications: No notable events documented.

## 2022-10-19 NOTE — Op Note (Signed)
Operative Note   Preoperative Diagnosis:Gallstones pancreatitis, cholecystitis, possible choledocholithiasis    Postoperative Diagnosis: Gallstones pancreatitis, acute cholecystitis, nodular liver   Procedure(s) Performed: Laparoscopic cholecystectomy, intraoperative cholangiogram, wedge liver biopsy    Surgeon: Leatrice Jewels. Henreitta Leber, MD   Assistants: No qualified resident was available   Anesthesia: General endotracheal   Anesthesiologist: Molli Barrows, MD    Specimens: Gallbladder    Estimated Blood Loss: Minimal    Blood Replacement: None    Complications: None    Operative Findings:  Distended inflamed gallbladder; nodular liver    Procedure: The patient was taken to the operating room and placed supine. General endotracheal anesthesia was induced. Intravenous antibiotics were administered per protocol. An orogastric tube positioned to decompress the stomach. The abdomen was prepared and draped in the usual sterile fashion.    A supraumbilical incision was made and a Veress technique was utilized to achieve pneumoperitoneum to 15 mmHg with carbon dioxide. A 11 mm optiview port was placed through the supraumbilical region, and a 10 mm 0-degree operative laparoscope was introduced. The area underlying the trocar and Veress needle were inspected and without evidence of injury.  Remaining trocars were placed under direct vision. Two 5 mm ports were placed in the right abdomen, between the anterior axillary and midclavicular line.  A final 11 mm port was placed through the mid-epigastrium, near the falciform ligament.    The gallbladder fundus was elevated cephalad and the infundibulum was retracted to the patient's right. The gallbladder/cystic duct junction was skeletonized. The cystic artery noted in the triangle of Calot and was also skeletonized.  We then continued liberal medial and lateral dissection until the critical view of safety was achieved.    The cystic artery was  doubly clipped and divided. The cystic duct was clipped distal and a ductotomy was made. A cholangiogram clamp and catheter were used to do a cholangiogram. There were no obvious stones in the common duct and contrast emptying into the duodenum. The catheter was flushed with saline. The duct was clipped proximally twice. The ducotomy was completed. The gallbladder was then dissected from the liver bed with electrocautery.  A wedge of liver was taken for biopsy due to the nodular nature and history of alcohol use. The specimen was placed in an Endopouch and was retrieved through the epigastric site.   Final inspection revealed acceptable hemostasis. Surgicel power and Surgical SNOW were placed in the gallbladder bed.  Trocars were removed and pneumoperitoneum was released.  0 Vicryl fascial sutures were used to close the epigastric and umbilical port sites. Skin incisions were closed with 4-0 Monocryl subcuticular sutures and Dermabond. The patient was awakened from anesthesia and extubated without complication.    Algis Greenhouse, MD [facility] [address] [place], [place] [address] [phone] (office)

## 2022-10-19 NOTE — Anesthesia Preprocedure Evaluation (Signed)
Anesthesia Evaluation  Patient identified by MRN, date of birth, ID band Patient awake    Reviewed: Allergy & Precautions, H&P , NPO status , Patient's Chart, lab work & pertinent test results  Airway Mallampati: II  TM Distance: >3 FB Neck ROM: Full    Dental  (+) Dental Advisory Given, Missing   Pulmonary shortness of breath and with exertion, COPD,  COPD inhaler, Current Smoker and Patient abstained from smoking.   Pulmonary exam normal breath sounds clear to auscultation       Cardiovascular Exercise Tolerance: Poor hypertension, Pt. on medications + DOE  Normal cardiovascular exam Rhythm:Regular Rate:Normal     Neuro/Psych  PSYCHIATRIC DISORDERS Anxiety     negative neurological ROS     GI/Hepatic Neg liver ROS,GERD  Medicated and Controlled,,  Endo/Other  Hypothyroidism    Renal/GU negative Renal ROS  negative genitourinary   Musculoskeletal negative musculoskeletal ROS (+)    Abdominal   Peds negative pediatric ROS (+)  Hematology negative hematology ROS (+)   Anesthesia Other Findings   Reproductive/Obstetrics negative OB ROS                             Anesthesia Physical Anesthesia Plan  ASA: 3  Anesthesia Plan: General   Post-op Pain Management: Dilaudid IV   Induction: Intravenous  PONV Risk Score and Plan: 4 or greater and Ondansetron, Dexamethasone and Midazolam  Airway Management Planned: Oral ETT  Additional Equipment:   Intra-op Plan:   Post-operative Plan: Extubation in OR  Informed Consent: I have reviewed the patients History and Physical, chart, labs and discussed the procedure including the risks, benefits and alternatives for the proposed anesthesia with the patient or authorized representative who has indicated his/her understanding and acceptance.     Dental advisory given  Plan Discussed with: Surgeon  Anesthesia Plan Comments:          Anesthesia Quick Evaluation

## 2022-10-19 NOTE — Anesthesia Postprocedure Evaluation (Signed)
Anesthesia Post Note  Patient: YAVONDA STARIHA  Procedure(s) Performed: LAPAROSCOPIC CHOLECYSTECTOMY WITH INTRAOPERATIVE CHOLANGIOGRAM (Abdomen) LIVER BIOPSY (Abdomen)  Patient location during evaluation: Phase II Anesthesia Type: General Level of consciousness: awake and alert and oriented Pain management: pain level controlled Vital Signs Assessment: post-procedure vital signs reviewed and stable Respiratory status: spontaneous breathing, nonlabored ventilation and respiratory function stable Cardiovascular status: blood pressure returned to baseline and stable Postop Assessment: no apparent nausea or vomiting Anesthetic complications: no  No notable events documented.   Last Vitals:  Vitals:   10/19/22 1530 10/19/22 1545  BP: (!) 150/63 (!) 140/57  Pulse: 94 95  Resp: 17 (!) 21  Temp:    SpO2: 99% 92%    Last Pain:  Vitals:   10/19/22 1545  TempSrc:   PainSc: 0-No pain                 Cletis Clack C Markeisha Mancias

## 2022-10-19 NOTE — Interval H&P Note (Signed)
History and Physical Interval Note:  10/19/2022 10:57 AM  Kristin [name]  has presented today for surgery, with the diagnosis of cholecystitis, CBD dilation.  The various methods of treatment have been discussed with the patient and family. After consideration of risks, benefits and other options for treatment, the patient has consented to  Procedure(s): LAPAROSCOPIC CHOLECYSTECTOMY WITH INTRAOPERATIVE CHOLANGIOGRAM (N/A) as a surgical intervention.  The patient's history has been reviewed, patient examined, no change in status, stable for surgery.  I have reviewed the patient's chart and labs.  Questions were answered to the patient's satisfaction.    LFT improving. She wants me to update [facility], she does not want me to call her family unless something unexpected happens. We have her son's number.   Lucretia Roers

## 2022-10-19 NOTE — Progress Notes (Signed)
PROGRESS NOTE    Kristin [name]  MVH:[id number] DOB: 1955-07-27 DOA: 10/17/2022 PCP: System, Provider Not In     Brief Narrative:  Kristin [name] is an 67 y.o. female with a PMH of tobacco abuse, COPD, hypertension, hyperlipidemia, hypothyroidism, chronic constipation, GERD, and anxiety disorder who presented to the ED for evaluation 10/17/2022 of abdominal pain and nausea.  Right upper quadrant ultrasound showed multiple gallstones and cholecystitis.  Lipase elevated at 303.  General surgery consulted with plans to proceed with cholecystectomy 10/19/2022.  New events last 24 hours / Subjective: Patient admits to right-sided abdominal pain, especially worsen with cough.  No nausea today.  Awaiting cholecystectomy scheduled for later today.  Assessment & Plan:   Principal Problem:   Cholelithiasis and acute cholecystitis without obstruction Active Problems:   Breast mass, right-Needs mammogram   COPD without exacerbation   HTN (hypertension)   Hypothyroidism   Tobacco abuse   Constipation   HLD (hyperlipidemia)   Gallstone pancreatitis   Gallstone pancreatitis, acute cholecystitis -General surgery planning for cholecystectomy today -Rocephin, Flagyl -IVF   Incidental finding of right lower quadrant breast mass -Will need outpatient mammogram, possible biopsy  COPD -Without acute decompensation  Hypertension -Clonidine, lisinopril  Hypothyroidism -Synthroid  Hyperlipidemia -Pravachol can be resumed at discharge  Anxiety -Effexor, Depakote, Rozerem  Pyuria -Urine culture is negative  Overweight -BMI 28  Hypokalemia -Replace  DVT prophylaxis:  heparin injection 5,000 Units Start: 10/20/22 0600 SCD's Start: 10/18/22 1410 SCDs Start: 10/17/22 1819 Place TED hose Start: 10/17/22 1819  Code Status: Full code Family Communication: No family at bedside Disposition Plan: Home once cleared by general surgery team and clinically improved Status is:  Inpatient Remains inpatient appropriate because: Surgery today    Antimicrobials:  Anti-infectives (From admission, onward)    Start     Dose/Rate Route Frequency Ordered Stop   10/19/22 1200  cefoTEtan (CEFOTAN) 2 g in sodium chloride 0.9 % 100 mL IVPB        2 g 200 mL/hr over 30 Minutes Intravenous On call to O.R. 10/18/22 1409 10/20/22 0559   10/19/22 1058  sodium chloride 0.9 % with cefoTEtan (CEFOTAN) ADS Med       Note to Pharmacy: Sherren Kerns H: cabinet override      10/19/22 1058 10/19/22 2314   10/18/22 1000  [MAR Hold]  cefTRIAXone (ROCEPHIN) 2 g in sodium chloride 0.9 % 100 mL IVPB        (MAR Hold since Mon 10/19/2022 at 1048.Hold Reason: Transfer to a Procedural area)   2 g 200 mL/hr over 30 Minutes Intravenous Every 24 hours 10/17/22 1328     10/17/22 1500  [MAR Hold]  metroNIDAZOLE (FLAGYL) IVPB 500 mg        (MAR Hold since Mon 10/19/2022 at 1048.Hold Reason: Transfer to a Procedural area)   500 mg 100 mL/hr over 60 Minutes Intravenous Every 12 hours 10/17/22 1328     10/17/22 1315  cefTRIAXone (ROCEPHIN) 1 g in sodium chloride 0.9 % 100 mL IVPB        1 g 200 mL/hr over 30 Minutes Intravenous  Once 10/17/22 1312 10/17/22 1413   10/17/22 1045  cefTRIAXone (ROCEPHIN) 1 g in sodium chloride 0.9 % 100 mL IVPB        1 g 200 mL/hr over 30 Minutes Intravenous  Once 10/17/22 1037 10/17/22 1122        Objective: Vitals:   10/19/22 0313 10/19/22 0711 10/19/22 1054 10/19/22 1101  BP: Marland Kitchen)  148/61  (!) 152/103 (!) 188/84  Pulse: (!) 59  (!) 58 (!) 53  Resp: 16  17   Temp: 98.4 F (36.9 C)  97.9 F (36.6 C)   TempSrc: Oral  Oral   SpO2: (!) 89% 92% 98%   Weight:   72.6 kg   Height:    (1.6 m)     Intake/Output Summary (Last 24 hours) at 10/19/2022 1319 Last data filed at 10/19/2022 0732 Gross per 24 hour  Intake 1758.79 ml  Output --  Net 1758.79 ml   Filed Weights   10/17/22 0826 10/19/22 1054  Weight: 72.6 kg 72.6 kg    Examination:  General exam:  Appears calm and comfortable  Respiratory system: Clear to auscultation. Respiratory effort normal. No respiratory distress. No conversational dyspnea.  Cardiovascular system: S1 & S2 heard, RRR. No murmurs. No pedal edema. Gastrointestinal system: Abdomen is soft and tender to palpation epigastrium, right upper quadrant, mildly distended Central nervous system: Alert and oriented. No focal neurological deficits. Speech clear.  Extremities: Symmetric in appearance  Skin: No rashes, lesions or ulcers on exposed skin  Psychiatry: Judgement and insight appear normal. Mood & affect appropriate.   Data Reviewed: I have personally reviewed following labs and imaging studies  CBC: Recent Labs  Lab 10/17/22 0841 10/18/22 0437 10/19/22 0558  WBC 10.3 7.6 4.8  NEUTROABS 7.4  --   --   HGB 12.2 11.5* 10.5*  HCT 35.7* 35.4* 32.6*  MCV 94.7 98.1 98.5  PLT 285 257 216   Basic Metabolic Panel: Recent Labs  Lab 10/17/22 0841 10/18/22 0437 10/19/22 0558  NA 137 139 139  K 4.2 3.7 3.4*  CL 103 103 107  CO2 GLUCOSE 145* 118* 136*  BUN CREATININE 0.82 0.91 0.75  CALCIUM 9.1 8.2* 7.8*   GFR: Estimated Creatinine Clearance: 67 mL/min (by C-G formula based on SCr of 0.75 mg/dL). Liver Function Tests: Recent Labs  Lab 10/17/22 0841 10/18/22 0437 10/19/22 0558  AST 22 79* 24  ALT 58* 135* 80*  ALKPHOS 80 107 91  BILITOT 0.7 0.9 0.3  PROT 6.6 6.1* 5.6*  ALBUMIN 3.6 3.3* 2.9*   Recent Labs  Lab 10/17/22 0841 10/18/22 0437  LIPASE 303* 76*   No results for input(s): "AMMONIA" in the last 168 hours. Coagulation Profile: Recent Labs  Lab 10/18/22 0437  INR 1.2   Cardiac Enzymes: No results for input(s): "CKTOTAL", "CKMB", "CKMBINDEX", "TROPONINI" in the last 168 hours. BNP (last 3 results) No results for input(s): "PROBNP" in the last 8760 hours. HbA1C: No results for input(s): "HGBA1C" in the last 72 hours. CBG: Recent Labs  Lab 10/18/22 1104  10/18/22 1607 10/18/22 2346 10/19/22 0515 10/19/22 1101  GLUCAP 131* 121* 148* 142* 127*   Lipid Profile: No results for input(s): "CHOL", "HDL", "LDLCALC", "TRIG", "CHOLHDL", "LDLDIRECT" in the last 72 hours. Thyroid Function Tests: No results for input(s): "TSH", "T4TOTAL", "FREET4", "T3FREE", "THYROIDAB" in the last 72 hours. Anemia Panel: No results for input(s): "VITAMINB12", "FOLATE", "FERRITIN", "TIBC", "IRON", "RETICCTPCT" in the last 72 hours. Sepsis Labs: No results for input(s): "PROCALCITON", "LATICACIDVEN" in the last 168 hours.  Recent Results (from the past 240 hour(s))  Urine Culture     Status: None   Collection Time: 10/17/22  9:14 AM   Specimen: Urine, Random  Result Value Ref Range Status   Specimen Description   Final    URINE, RANDOM Performed at [facility], [address] [place], [place] [address]    Special Requests   Final    NONE Performed at [facility], [address] [place], [place] [address]    Culture   Final    NO GROWTH Performed at [facility], [address] [place], [place] [address]    Report Status 10/19/2022 FINAL  Final  MRSA Next Gen by PCR, Nasal     Status: None   Collection Time: 10/18/22  8:37 PM   Specimen: Nasal Mucosa; Nasal Swab  Result Value Ref Range Status   MRSA by PCR Next Gen NOT DETECTED NOT DETECTED Final    Comment: (NOTE) The GeneXpert MRSA Assay (FDA approved for NASAL specimens only), is one component of a comprehensive MRSA colonization surveillance program. It is not intended to diagnose MRSA infection nor to guide or monitor treatment for MRSA infections. Test performance is not FDA approved in patients less than 59 years old. Performed at [facility], [address], [place], [place] [address]       Radiology Studies: No results found.    Scheduled Meds:  [MAR Hold] cloNIDine  0.1 mg Oral BID   [MAR Hold] divalproex  125 mg Oral BID   [MAR Hold] fluticasone furoate-vilanterol  1  puff Inhalation Daily   And   [MAR Hold] umeclidinium bromide  1 puff Inhalation Daily   [MAR Hold] gabapentin  600 mg Oral TID   [MAR Hold] heparin  5,000 Units Subcutaneous Q8H   [MAR Hold] levothyroxine  88 mcg Oral QAC breakfast   [MAR Hold] pantoprazole (PROTONIX) IV  40 mg Intravenous Daily   [MAR Hold] polyethylene glycol  17 g Oral BID   [MAR Hold] ramelteon  8 mg Oral QHS   [MAR Hold] senna-docusate  2 tablet Oral BID   [MAR Hold] sodium chloride flush  3 mL Intravenous Q12H   [MAR Hold] sodium chloride flush  3 mL Intravenous Q12H   [MAR Hold] venlafaxine XR  75 mg Oral Q breakfast   Continuous Infusions:  [MAR Hold] sodium chloride     cefoTEtan (CEFOTAN) IV     [MAR Hold] cefTRIAXone (ROCEPHIN)  IV 2 g (10/19/22 0831)   dextrose 5 % and 0.45% NaCl 75 mL/hr at 10/19/22 1010   lactated ringers 50 mL/hr at 10/19/22 1306   [MAR Hold] metronidazole 500 mg (10/19/22 0315)   sodium chloride 0.9 % with cefoTEtan (CEFOTAN) ADS Med       LOS: 2 days   Time spent: 35 minutes   Noralee Stain, DO [facility] 10/19/2022, 1:19 PM   Available via Epic secure chat 7am-7pm After these hours, please refer to coverage provider listed on [email or web address]

## 2022-10-19 NOTE — Anesthesia Procedure Notes (Signed)
Procedure Name: Intubation Date/Time: 10/19/2022 1:16 PM  Performed by: Cy Blamer, CRNAPre-anesthesia Checklist: Patient identified, Emergency Drugs available, Suction available and Patient being monitored Patient Re-evaluated:Patient Re-evaluated prior to induction Oxygen Delivery Method: Circle system utilized Preoxygenation: Pre-oxygenation with 100% oxygen Induction Type: IV induction Ventilation: Mask ventilation without difficulty Laryngoscope Size: Miller and 2 Grade View: Grade II Tube type: Oral Tube size: 6.5 mm Number of attempts: 1 Airway Equipment and Method: Stylet and Bite block Placement Confirmation: ETT inserted through vocal cords under direct vision, positive ETCO2 and breath sounds checked- equal and bilateral Secured at: 19 cm Tube secured with: Tape Dental Injury: Teeth and Oropharynx as per pre-operative assessment

## 2022-10-19 NOTE — Discharge Instructions (Signed)
Discharge Laparoscopic Surgery Instructions:  Common Complaints: Right shoulder pain is common after laparoscopic surgery. This is secondary to the gas used in the surgery being trapped under the diaphragm.  Walk to help your body absorb the gas. This will improve in a few days. Pain at the port sites are common, especially the larger port sites. This will improve with time.  Some nausea is common and poor appetite. The main goal is to stay hydrated the first few days after surgery.   Diet/ Activity: Diet as tolerated. You may not have an appetite, but it is important to stay hydrated. Drink 64 ounces of water a day. Your appetite will return with time.  Shower per your regular routine daily.  Do not take hot showers. Take warm showers that are less than 10 minutes. Rest and listen to your body, but do not remain in bed all day.  Walk everyday for at least 15-20 minutes. Deep cough and move around every 1-2 hours in the first few days after surgery.  Do not lift > 10 lbs, perform excessive bending, pushing, pulling, squatting for 1-2 weeks after surgery.  Do not pick at the dermabond glue on your incision sites.  This glue film will remain in place for 1-2 weeks and will start to peel off.  Do not place lotions or balms on your incision unless instructed to specifically by Dr. Tirsa Gail.   Pain Expectations and Narcotics: -After surgery you will have pain associated with your incisions and this is normal. The pain is muscular and nerve pain, and will get better with time. -You are encouraged and expected to take non narcotic medications like tylenol and ibuprofen (when able) to treat pain as multiple modalities can aid with pain treatment. -Narcotics are only used when pain is severe or there is breakthrough pain. -You are not expected to have a pain score of 0 after surgery, as we cannot prevent pain. A pain score of 3-4 that allows you to be functional, move, walk, and tolerate some activity is  the goal. The pain will continue to improve over the days after surgery and is dependent on your surgery. -Due to [place] law, we are only able to give a certain amount of pain medication to treat post operative pain, and we only give additional narcotics on a patient by patient basis.  -For most laparoscopic surgery, studies have shown that the majority of patients only need 10-15 narcotic pills, and for open surgeries most patients only need 15-20.   -Having appropriate expectations of pain and knowledge of pain management with non narcotics is important as we do not want anyone to become addicted to narcotic pain medication.  -Using ice packs in the first 48 hours and heating pads after 48 hours, wearing an abdominal binder (when recommended), and using over the counter medications are all ways to help with pain management.   -Simple acts like meditation and mindfulness practices after surgery can also help with pain control and research has proven the benefit of these practices.  Medication: Take tylenol and ibuprofen as needed for pain control, alternating every 4-6 hours.  Example:  Tylenol 1000mg @ 6am, 12noon, 6pm, 12midnight (Do not exceed 4000mg of tylenol a day). Ibuprofen 800mg @ 9am, 3pm, 9pm, 3am (Do not exceed 3600mg of ibuprofen a day).  Take Roxicodone for breakthrough pain every 4 hours.  Take Colace for constipation related to narcotic pain medication. If you do not have a bowel movement in 2 days, take Miralax   over the counter.  Drink plenty of water to also prevent constipation.   Contact Information: If you have questions or concerns, please call our office, [phone], Monday- Thursday 8AM-5PM and Friday 8AM-12Noon.  If it is after hours or on the weekend, please call [facility] Number, [phone], [phone], and ask to speak to the surgeon on call for Dr. Blyss Lugar at [facility].   

## 2022-10-20 DIAGNOSIS — K8000 Calculus of gallbladder with acute cholecystitis without obstruction: Secondary | ICD-10-CM | POA: Diagnosis not present

## 2022-10-20 LAB — CBC
HCT: 31.6 % — ABNORMAL LOW (ref 36.0–46.0)
Hemoglobin: 10 g/dL — ABNORMAL LOW (ref 12.0–15.0)
MCH: 31.4 pg (ref 26.0–34.0)
MCHC: 31.6 g/dL (ref 30.0–36.0)
MCV: 99.4 fL (ref 80.0–100.0)
Platelets: 224 10*3/uL (ref 150–400)
RBC: 3.18 MIL/uL — ABNORMAL LOW (ref 3.87–5.11)
RDW: 12.8 % (ref 11.5–15.5)
WBC: 8.2 10*3/uL (ref 4.0–10.5)
nRBC: 0 % (ref 0.0–0.2)

## 2022-10-20 LAB — COMPREHENSIVE METABOLIC PANEL
ALT: 80 U/L — ABNORMAL HIGH (ref 0–44)
AST: 49 U/L — ABNORMAL HIGH (ref 15–41)
Albumin: 3 g/dL — ABNORMAL LOW (ref 3.5–5.0)
Alkaline Phosphatase: 94 U/L (ref 38–126)
Anion gap: 4 — ABNORMAL LOW (ref 5–15)
BUN: 11 mg/dL (ref 8–23)
CO2: 25 mmol/L (ref 22–32)
Calcium: 7.6 mg/dL — ABNORMAL LOW (ref 8.9–10.3)
Chloride: 106 mmol/L (ref 98–111)
Creatinine, Ser: 0.94 mg/dL (ref 0.44–1.00)
GFR, Estimated: >60 mL/min (ref >60)
Glucose, Bld: 167 mg/dL — ABNORMAL HIGH (ref 70–99)
Potassium: 4.6 mmol/L (ref 3.5–5.1)
Sodium: 135 mmol/L (ref 135–145)
Total Bilirubin: 0.4 mg/dL (ref 0.3–1.2)
Total Protein: 5.8 g/dL — ABNORMAL LOW (ref 6.5–8.1)

## 2022-10-20 LAB — GLUCOSE, CAPILLARY
Glucose-Capillary: 165 mg/dL — ABNORMAL HIGH (ref 70–99)
Glucose-Capillary: 183 mg/dL — ABNORMAL HIGH (ref 70–99)
Glucose-Capillary: 268 mg/dL — ABNORMAL HIGH (ref 70–99)

## 2022-10-20 MED ORDER — LISINOPRIL 10 MG PO TABS
20.0000 mg | ORAL_TABLET | Freq: Every day | ORAL | Status: DC
  Administered 2022-10-20: 20 mg via ORAL
  Filled 2022-10-20: qty 2

## 2022-10-20 MED ORDER — ONDANSETRON HCL 4 MG PO TABS: 4.0000 mg | ORAL_TABLET | Freq: Four times a day (QID) | ORAL | 0 refills | Status: AC | PRN

## 2022-10-20 MED ORDER — OXYCODONE HCL 5 MG PO TABS: 5.0000 mg | ORAL_TABLET | ORAL | 0 refills | Status: DC | PRN

## 2022-10-20 NOTE — Care Management Important Message (Signed)
Important Message  Patient Details  Name: Kristin [name] MRN: [id number] Date of Birth: [birth year]   Medicare Important Message Given:  Yes     Corey Harold 10/20/2022, 10:25 AM

## 2022-10-20 NOTE — TOC Transition Note (Signed)
Transition of Care [facility]) - CM/SW Discharge Note   Patient Details  Name: Kristin [name] MRN: [id number] Date of Birth: Jan 24, 1956  [facility] CM/SW Contact:  Karn Cassis, LCSW Phone Number: 10/20/2022, 2:24 PM   Clinical Narrative:  Pt d/c today back to [facility]. Chasity at ALF notified. Pt aware and states she will notify her family this evening. D/C summary and FL2 sent to ALF. Facility to pick up pt this afternoon. RN updated.      Final next level of care: Assisted Living Barriers to Discharge: Barriers Resolved   Patient Goals and CMS Choice   Choice offered to / list presented to : Patient  Discharge Placement                  Patient to be transferred to facility by: facility Zenaida Niece Name of family member notified: pt states she will call family Patient and family notified of of transfer: 10/20/22  Discharge Plan and Services Additional resources added to the After Visit Summary for                                       Social Determinants of Health (SDOH) Interventions SDOH Screenings   Food Insecurity: No Food Insecurity (10/17/2022)  Housing: Low Risk  (10/17/2022)  Transportation Needs: No Transportation Needs (10/17/2022)  Utilities: Not At Risk (10/17/2022)  Tobacco Use: High Risk (10/19/2022)     Readmission Risk Interventions     No data to display

## 2022-10-20 NOTE — [place] FL2 (Signed)
[place] MEDICAID FL2 LEVEL OF CARE FORM     IDENTIFICATION  Patient Name: Kristin [name] Birthdate: 1956/02/14 Sex: female Admission Date (Current Location): 10/17/2022  [place] and [place] Number:  Aaron Edelman [id number] Q Facility and Address:  [facility],  [address], [place] [address]      Provider Number: [id number]  Attending Physician Name and Address:  Noralee Stain, DO  Relative Name and Phone Number:       Current Level of Care: Hospital Recommended Level of Care: Assisted Living Facility Prior Approval Number:    Date Approved/Denied:   PASRR Number:    Discharge Plan: Other (Comment) (ALF)    Current Diagnoses: Patient Active Problem List   Diagnosis Date Noted   Nodular hyperplasia of liver 10/19/2022   Gallstone pancreatitis 10/18/2022   Breast mass, right-Needs mammogram 10/17/2022   Cholelithiasis and acute cholecystitis without obstruction 10/17/2022   Hypothyroidism 10/17/2022   COPD without exacerbation 10/17/2022   Tobacco abuse 10/17/2022   HTN (hypertension) 10/17/2022   Constipation 10/17/2022   HLD (hyperlipidemia) 10/17/2022    Orientation RESPIRATION BLADDER Height & Weight     Self, Time, Situation, Place  Normal Continent Weight: 160 lb 0.9 oz (72.6 kg) Height:   (160 cm)  BEHAVIORAL SYMPTOMS/MOOD NEUROLOGICAL BOWEL NUTRITION STATUS      Continent Diet (Soft diet)  AMBULATORY STATUS COMMUNICATION OF NEEDS Skin   Limited Assist Verbally Surgical wounds                       Personal Care Assistance Level of Assistance  Bathing, Feeding, Dressing Bathing Assistance: Limited assistance Feeding assistance: Limited assistance Dressing Assistance: Limited assistance     Functional Limitations Info  Sight, Hearing, Speech Sight Info: Adequate Hearing Info: Adequate Speech Info: Adequate    SPECIAL CARE FACTORS FREQUENCY                       Contractures      Additional Factors Info   Code Status, Allergies, Psychotropic Code Status Info: Full code Allergies Info: No known allergies Psychotropic Info: Depakote sprinkle         Current Medications (10/20/2022):  This is the current hospital active medication list Current Facility-Administered Medications  Medication Dose Route Frequency Provider Last Rate Last Admin   0.9 %  sodium chloride infusion   Intravenous PRN Lucretia Roers, MD       acetaminophen (TYLENOL) tablet 650 mg  650 mg Oral Q6H PRN Lucretia Roers, MD   650 mg at 10/19/22 2310   Or   acetaminophen (TYLENOL) suppository 650 mg  650 mg Rectal Q6H PRN Lucretia Roers, MD       albuterol (PROVENTIL) (2.5 MG/3ML) 0.083% nebulizer solution 2.5 mg  2.5 mg Nebulization Q2H PRN Lucretia Roers, MD       bisacodyl (DULCOLAX) suppository 10 mg  10 mg Rectal Daily PRN Lucretia Roers, MD       cefTRIAXone (ROCEPHIN) 2 g in sodium chloride 0.9 % 100 mL IVPB  2 g Intravenous Q24H Lucretia Roers, MD 200 mL/hr at 10/20/22 0827 2 g at 10/20/22 0827   cloNIDine (CATAPRES) tablet 0.1 mg  0.1 mg Oral BID Lucretia Roers, MD   0.1 mg at 10/20/22 0822   dextrose 5 %-0.45 % sodium chloride infusion   Intravenous Continuous Lucretia Roers, MD 75 mL/hr at 10/20/22 0538 New Bag at 10/20/22 [id number]  divalproex (DEPAKOTE SPRINKLE) capsule 125 mg  125 mg Oral BID Lucretia Roers, MD   125 mg at 10/20/22 0822   fluticasone furoate-vilanterol (BREO ELLIPTA) 200-25 MCG/ACT 1 puff  1 puff Inhalation Daily Lucretia Roers, MD   1 puff at 10/20/22 0811   And   umeclidinium bromide (INCRUSE ELLIPTA) 62.5 MCG/ACT 1 puff  1 puff Inhalation Daily Lucretia Roers, MD   1 puff at 10/20/22 0811   gabapentin (NEURONTIN) capsule 600 mg  600 mg Oral TID Lucretia Roers, MD   600 mg at 10/20/22 6213   heparin injection 5,000 Units  5,000 Units Subcutaneous Q8H Lucretia Roers, MD   5,000 Units at 10/20/22 0516   hydrALAZINE (APRESOLINE) injection 10 mg  10 mg  Intravenous Q6H PRN Lucretia Roers, MD       HYDROmorphone (DILAUDID) injection 1 mg  1 mg Intravenous Q4H PRN Lucretia Roers, MD   1 mg at 10/20/22 0533   levothyroxine (SYNTHROID) tablet 88 mcg  88 mcg Oral QAC breakfast Lucretia Roers, MD   88 mcg at 10/20/22 0515   lisinopril (ZESTRIL) tablet 20 mg  20 mg Oral Daily Noralee Stain, DO   20 mg at 10/20/22 0865   metroNIDAZOLE (FLAGYL) IVPB 500 mg  500 mg Intravenous Q12H Algis Greenhouse C, MD 100 mL/hr at 10/20/22 0230 500 mg at 10/20/22 0230   ondansetron (ZOFRAN) tablet 4 mg  4 mg Oral Q6H PRN Lucretia Roers, MD       Or   ondansetron [facility]) injection 4 mg  4 mg Intravenous Q6H PRN Lucretia Roers, MD       oxyCODONE (Oxy IR/ROXICODONE) immediate release tablet 5 mg  5 mg Oral Q4H PRN Lucretia Roers, MD   5 mg at 10/20/22 0356   pantoprazole (PROTONIX) injection 40 mg  40 mg Intravenous Daily Lucretia Roers, MD   40 mg at 10/20/22 0823   polyethylene glycol (MIRALAX / GLYCOLAX) packet 17 g  17 g Oral Daily PRN Lucretia Roers, MD       polyethylene glycol (MIRALAX / GLYCOLAX) packet 17 g  17 g Oral BID Lucretia Roers, MD   17 g at 10/19/22 2107   ramelteon (ROZEREM) tablet 8 mg  8 mg Oral QHS Lucretia Roers, MD   8 mg at 10/19/22 2310   senna-docusate (Senokot-S) tablet 2 tablet  2 tablet Oral BID Lucretia Roers, MD   2 tablet at 10/19/22 2106   sodium chloride flush (NS) 0.9 % injection 3 mL  3 mL Intravenous Q12H Lucretia Roers, MD   3 mL at 10/19/22 2107   sodium chloride flush (NS) 0.9 % injection 3 mL  3 mL Intravenous Q12H Lucretia Roers, MD   3 mL at 10/20/22 0823   sodium chloride flush (NS) 0.9 % injection 3 mL  3 mL Intravenous PRN Lucretia Roers, MD       venlafaxine XR (EFFEXOR-XR) 24 hr capsule 75 mg  75 mg Oral Q breakfast Lucretia Roers, MD   75 mg at 10/20/22 7846     Discharge Medications: TAKE these medications     albuterol 108 (90 Base) MCG/ACT  inhaler Commonly known as: VENTOLIN HFA Inhale 2 puffs into the lungs every 8 (eight) hours as needed for wheezing or shortness of breath.    alendronate 70 MG tablet Commonly known as: FOSAMAX Take 70 mg by mouth every Monday.  calcium carbonate 750 MG chewable tablet Commonly known as: TUMS EX Chew 1 tablet by mouth 3 (three) times daily as needed for heartburn (and/or indigestion).    cloNIDine 0.1 MG tablet Commonly known as: CATAPRES Take 0.1 mg by mouth 2 (two) times daily.    cyanocobalamin 1000 MCG tablet Commonly known as: VITAMIN B12 Take 3,000 mcg by mouth daily.    desvenlafaxine 50 MG 24 hr tablet Commonly known as: PRISTIQ Take 50 mg by mouth daily.    divalproex 125 MG capsule Commonly known as: DEPAKOTE SPRINKLE Take 125 mg by mouth 2 (two) times daily.    gabapentin 600 MG tablet Commonly known as: NEURONTIN Take 600 mg by mouth 3 (three) times daily.    guaiFENesin 100 MG/5ML liquid Commonly known as: ROBITUSSIN Take 5 mLs by mouth every 8 (eight) hours as needed for cough or to loosen phlegm.    ibuprofen 800 MG tablet Commonly known as: ADVIL Take 1 tablet (800 mg total) by mouth 3 (three) times daily. What changed:  how much to take when to take this reasons to take this    levothyroxine 88 MCG tablet Commonly known as: SYNTHROID Take 88 mcg by mouth daily before breakfast.    lisinopril 20 MG tablet Commonly known as: ZESTRIL Take 20 mg by mouth daily.    nystatin 100000 UNIT/ML suspension Commonly known as: MYCOSTATIN Take 5 mLs by mouth 4 (four) times daily. 10 DAY COURSE STARTING ON 10/12/22 AND TO BE COMPLETED ON 10/22/2022    ondansetron 4 MG tablet Commonly known as: ZOFRAN Take 1 tablet (4 mg total) by mouth every 6 (six) hours as needed for nausea.    oxyCODONE 5 MG immediate release tablet Commonly known as: Oxy IR/ROXICODONE Take 1 tablet (5 mg total) by mouth every 4 (four) hours as needed for severe pain or breakthrough  pain.    pantoprazole 40 MG tablet Commonly known as: PROTONIX Take 40 mg by mouth daily.    pravastatin 40 MG tablet Commonly known as: PRAVACHOL Take 40 mg by mouth daily.    psyllium 0.52 g capsule Commonly known as: REGULOID Take 0.52 g by mouth at bedtime.    ramelteon 8 MG tablet Commonly known as: ROZEREM Take 8 mg by mouth at bedtime.    tiZANidine 4 MG tablet Commonly known as: ZANAFLEX Take 4 mg by mouth every 8 (eight) hours as needed for muscle spasms.    Trelegy Ellipta 200-62.5-25 MCG/ACT Aepb Generic drug: Fluticasone-Umeclidin-Vilant Inhale 1 puff into the lungs daily. *RINSE MOUTH WITH WATER AND SPIT DAILY    V-R PAIN RELIEVING Gel Apply 1 Application topically 2 (two) times daily after a meal. Applied to lower back as directed    zolpidem 12.5 MG CR tablet Commonly known as: AMBIEN CR Take 12.5 mg by mouth at bedtime.      Relevant Imaging Results:  Relevant Lab Results:   Additional Information    Karn Cassis, LCSW

## 2022-10-20 NOTE — Discharge Summary (Addendum)
Physician Discharge Summary  Kristin [name] WUJ:[id number] DOB: 1956/01/18 DOA: 10/17/2022  PCP: Catalina Lunger, DO  Admit date: 10/17/2022 Discharge date: 10/20/2022  Admitted From: ALF Disposition:  ALF  Recommendations for Outpatient Follow-up:  Follow up with general surgery  Repeat LFT in 1 week  Follow up with PCP Incidental finding of right lower quadrant breast mass. Will need outpatient mammogram, possible biopsy  Discharge Condition: Stable CODE STATUS: Full  Diet recommendation: Soft diet --> Regular diet   Brief/Interim Summary: Kristin [name] is an 67 y.o. female with a PMH of tobacco abuse, COPD, hypertension, hyperlipidemia, hypothyroidism, chronic constipation, GERD, and anxiety disorder who presented to the ED for evaluation 10/17/2022 of abdominal pain and nausea. Right upper quadrant ultrasound showed multiple gallstones and cholecystitis. Lipase elevated at 303. Patient underwent cholecystectomy 10/19/2022. Cholangiogram negative for choledocholithiasis. Her symptoms improved.   Discharge Diagnoses:   Principal Problem:   Cholelithiasis and acute cholecystitis without obstruction Active Problems:   Breast mass, right-Needs mammogram   COPD without exacerbation   HTN (hypertension)   Hypothyroidism   Tobacco abuse   Constipation   HLD (hyperlipidemia)   Gallstone pancreatitis   Nodular hyperplasia of liver    Gallstone pancreatitis, acute cholecystitis -S/p cholecystectomy    Incidental finding of right lower quadrant breast mass -Will need outpatient mammogram, possible biopsy   COPD -Without acute decompensation   Hypertension -Clonidine, lisinopril   Hypothyroidism -Synthroid   Hyperlipidemia -Pravachol    Anxiety -Effexor, Depakote, Rozerem   Pyuria -Urine culture is negative   Overweight -BMI 28   Discharge Instructions  Discharge Instructions     Call MD for:  difficulty breathing, headache or visual disturbances    Complete by: As directed    Call MD for:  extreme fatigue   Complete by: As directed    Call MD for:  hives   Complete by: As directed    Call MD for:  persistant dizziness or light-headedness   Complete by: As directed    Call MD for:  persistant nausea and vomiting   Complete by: As directed    Call MD for:  redness, tenderness, or signs of infection (pain, swelling, redness, odor or green/yellow discharge around incision site)   Complete by: As directed    Call MD for:  severe uncontrolled pain   Complete by: As directed    Call MD for:  temperature >100.4   Complete by: As directed    Diet general   Complete by: As directed    Discharge instructions   Complete by: As directed    You were cared for by a hospitalist during your hospital stay. If you have any questions about your discharge medications or the care you received while you were in the hospital after you are discharged, you can call the unit and ask to speak with the hospitalist on call if the hospitalist that took care of you is not available. Once you are discharged, your primary care physician will handle any further medical issues. Please note that NO REFILLS for any discharge medications will be authorized once you are discharged, as it is imperative that you return to your primary care physician (or establish a relationship with a primary care physician if you do not have one) for your aftercare needs so that they can reassess your need for medications and monitor your lab values.   Increase activity slowly   Complete by: As directed       Allergies as of 10/20/2022  No Known Allergies      Medication List     TAKE these medications    albuterol 108 (90 Base) MCG/ACT inhaler Commonly known as: VENTOLIN HFA Inhale 2 puffs into the lungs every 8 (eight) hours as needed for wheezing or shortness of breath.   alendronate 70 MG tablet Commonly known as: FOSAMAX Take 70 mg by mouth every Monday.   calcium  carbonate 750 MG chewable tablet Commonly known as: TUMS EX Chew 1 tablet by mouth 3 (three) times daily as needed for heartburn (and/or indigestion).   cloNIDine 0.1 MG tablet Commonly known as: CATAPRES Take 0.1 mg by mouth 2 (two) times daily.   cyanocobalamin 1000 MCG tablet Commonly known as: VITAMIN B12 Take 3,000 mcg by mouth daily.   desvenlafaxine 50 MG 24 hr tablet Commonly known as: PRISTIQ Take 50 mg by mouth daily.   divalproex 125 MG capsule Commonly known as: DEPAKOTE SPRINKLE Take 125 mg by mouth 2 (two) times daily.   gabapentin 600 MG tablet Commonly known as: NEURONTIN Take 600 mg by mouth 3 (three) times daily.   guaiFENesin 100 MG/5ML liquid Commonly known as: ROBITUSSIN Take 5 mLs by mouth every 8 (eight) hours as needed for cough or to loosen phlegm.   ibuprofen 800 MG tablet Commonly known as: ADVIL Take 1 tablet (800 mg total) by mouth 3 (three) times daily. What changed:  how much to take when to take this reasons to take this   levothyroxine 88 MCG tablet Commonly known as: SYNTHROID Take 88 mcg by mouth daily before breakfast.   lisinopril 20 MG tablet Commonly known as: ZESTRIL Take 20 mg by mouth daily.   nystatin 100000 UNIT/ML suspension Commonly known as: MYCOSTATIN Take 5 mLs by mouth 4 (four) times daily. 10 DAY COURSE STARTING ON 10/12/22 AND TO BE COMPLETED ON 10/22/2022   ondansetron 4 MG tablet Commonly known as: ZOFRAN Take 1 tablet (4 mg total) by mouth every 6 (six) hours as needed for nausea.   oxyCODONE 5 MG immediate release tablet Commonly known as: Oxy IR/ROXICODONE Take 1 tablet (5 mg total) by mouth every 4 (four) hours as needed for severe pain or breakthrough pain.   pantoprazole 40 MG tablet Commonly known as: PROTONIX Take 40 mg by mouth daily.   pravastatin 40 MG tablet Commonly known as: PRAVACHOL Take 40 mg by mouth daily.   psyllium 0.52 g capsule Commonly known as: REGULOID Take 0.52 g by mouth at  bedtime.   ramelteon 8 MG tablet Commonly known as: ROZEREM Take 8 mg by mouth at bedtime.   tiZANidine 4 MG tablet Commonly known as: ZANAFLEX Take 4 mg by mouth every 8 (eight) hours as needed for muscle spasms.   Trelegy Ellipta 200-62.5-25 MCG/ACT Aepb Generic drug: Fluticasone-Umeclidin-Vilant Inhale 1 puff into the lungs daily. *RINSE MOUTH WITH WATER AND SPIT DAILY   V-R PAIN RELIEVING Gel Apply 1 Application topically 2 (two) times daily after a meal. Applied to lower back as directed   zolpidem 12.5 MG CR tablet Commonly known as: AMBIEN CR Take 12.5 mg by mouth at bedtime.        Follow-up Information     Lucretia Roers, MD Follow up on 11/03/2022.   Specialty: General Surgery Why: post op call; if you need to be seen in person call the office Contact information: [address] [place] [address] [phone]         Catalina Lunger, DO Follow up.   Specialty: Family  Medicine Why: Instructions: Follow up with PCP Incidental finding of right lower quadrant breast mass. Will need outpatient mammogram, possible biopsy Repeat liver function test in 1 week Contact information: [address] [place] [address] [phone]                No Known Allergies  Consultations: General surgery    Procedures/Studies: DG Cholangiogram Operative  Result Date: 10/19/2022 CLINICAL DATA:  Intraoperative cholangiogram during laparoscopic cholecystectomy. EXAM: INTRAOPERATIVE CHOLANGIOGRAM FLUOROSCOPY TIME:  1 minute, 5 seconds COMPARISON:  Right upper quadrant abdominal ultrasound-10/17/2022; CT abdomen pelvis-10/17/2022 FINDINGS: Intraoperative cholangiographic images of the right upper abdominal quadrant during laparoscopic cholecystectomy are provided for review. Surgical clips overlie the expected location of the gallbladder fossa. Contrast injection demonstrates selective cannulation of the central aspect of the cystic duct. There is passage of  contrast through the central aspect of the cystic duct with filling of a mildly dilated common bile duct. There is passage of contrast though the CBD and into the descending portion of the duodenum. There is minimal reflux of injected contrast into the common hepatic duct and central aspect of the non dilated intrahepatic biliary system. There are no discrete filling defects within the opacified portions of the biliary system to suggest the presence of choledocholithiasis. IMPRESSION: No evidence of choledocholithiasis. Electronically Signed   By: Simonne Come M.D.   On: 10/19/2022 15:22   US Abdomen Limited RUQ (LIVER/GB)  Result Date: 10/17/2022 CLINICAL DATA:  Acute right upper quadrant abdominal pain. EXAM: ULTRASOUND ABDOMEN LIMITED RIGHT UPPER QUADRANT COMPARISON:  CT scan of same day. FINDINGS: Gallbladder: Multiple gallstones are noted, with 1 noted in the neck of the gallbladder. Severe gallbladder wall thickening is noted measuring 12 mm at 1 point. No sonographic Murphy's sign is noted. Common bile duct: Diameter: 8 mm which is mildly dilated. Liver: No focal lesion identified. Within normal limits in parenchymal echogenicity. Portal vein is patent on color Doppler imaging with normal direction of blood flow towards the liver. Other: None. IMPRESSION: Multiple gallstones are noted with at least 1 calculus noted in the neck of the gallbladder. Severe gallbladder wall thickening is noted. These findings are concerning for possible cholecystitis. Mild common bile duct dilatation is noted as described on CT scan of same day. Electronically Signed   By: Lupita Raider M.D.   On: 10/17/2022 12:49   CT ABDOMEN PELVIS W CONTRAST  Result Date: 10/17/2022 CLINICAL DATA:  Acute abdominal pain.  Worsening abdominal pain. EXAM: CT ABDOMEN AND PELVIS WITH CONTRAST TECHNIQUE: Multidetector CT imaging of the abdomen and pelvis was performed using the standard protocol following bolus administration of intravenous  contrast. RADIATION DOSE REDUCTION: This exam was performed according to the departmental dose-optimization program which includes automated exposure control, adjustment of the mA and/or kV according to patient size and/or use of iterative reconstruction technique. CONTRAST:  OMNIPAQUE IOHEXOL 300 MG/ML  SOLN COMPARISON:  None Available. FINDINGS: Lower chest: No acute abnormality. Questionable mass within the lower RIGHT breast, measuring approximately 1 cm greatest dimension (series 2, image 16). Hepatobiliary: No acute or suspicious findings within the liver. Sludge and/or stones are present within the nondistended gallbladder. Diffuse enhancement of the gallbladder walls. No pericholecystic fluid. Common bile duct measures approximately 1 cm diameter. No stone is identified within the common bile duct. Pancreas: Unremarkable. No pancreatic ductal dilatation or surrounding inflammatory changes. Spleen: Normal in size without focal abnormality. Adrenals/Urinary Tract: Adrenal glands appear normal. Kidneys are unremarkable without suspicious mass, stone  or hydronephrosis. No ureteral or bladder calculi are identified. Bladder walls are circumferentially thickened, possibly accentuated to some degree by incomplete bladder distention. Stomach/Bowel: No dilated large or small bowel loops. No evidence of bowel wall inflammation. Appendix appears normal. Scattered diverticulosis of the sigmoid colon but no focal inflammatory changes seen to suggest acute diverticulitis. Stomach is unremarkable, although partially decompressed limiting characterization. Vascular/Lymphatic: Aortic atherosclerosis. No acute-appearing vascular abnormality. Mildly prominent lymph nodes within the porta hepatis and portacaval spaces. No enlarged lymph nodes identified elsewhere in the abdomen or pelvis. Reproductive: Uterus and bilateral adnexa are unremarkable. Other: No free fluid or abscess collection is seen. No free intraperitoneal  air. Musculoskeletal: Degenerative spondylosis of the slightly scoliotic thoracolumbar spine. No acute-appearing osseous abnormality. IMPRESSION: 1. Sludge and/or stones within the nondistended gallbladder. Diffuse enhancement of the gallbladder walls, but no pericholecystic fluid. Recommend right upper quadrant ultrasound to evaluate for acute cholecystitis. 2. Common bile duct measures approximately 1 cm diameter. No stone is identified within the common bile duct. Recommend correlation with LFTs. If LFTs are elevated, would recommend MRCP for further characterization. This CBD dilatation can also be further characterized on the RIGHT upper quadrant ultrasound recommended above. 3. Bladder walls are circumferentially thickened, possibly accentuated to some degree by incomplete bladder distention. Recommend correlation with urinalysis to exclude cystitis. 4. Colonic diverticulosis without evidence of acute diverticulitis. 5. Questionable mass within the lower RIGHT breast, measuring approximately 1 cm greatest dimension. Recommend nonemergent diagnostic mammogram at a Breast Center for further characterization. 6. Mildly prominent lymph nodes within the porta hepatis and portacaval spaces, most likely reactive in nature. Aortic Atherosclerosis (ICD10-I70.0). Electronically Signed   By: Bary Richard M.D.   On: 10/17/2022 10:07   DG Abdomen Acute W/Chest  Result Date: 10/17/2022 CLINICAL DATA:  Acute abdominal pain. EXAM: DG ABDOMEN ACUTE WITH 1 VIEW CHEST COMPARISON:  None Available. FINDINGS: There is no evidence of dilated bowel loops or free intraperitoneal air. No radiopaque calculi or other significant radiographic abnormality is seen. Heart size and mediastinal contours are within normal limits. Both lungs are clear. IMPRESSION: No abnormal bowel dilatation.  No acute cardiopulmonary disease. Electronically Signed   By: Lupita Raider M.D.   On: 10/17/2022 09:12       Discharge Exam: Vitals:    10/20/22 0518 10/20/22 0813  BP: (!) 146/68   Pulse: 69 87  Resp:  18  Temp:    SpO2:  93%    General: Pt is alert, awake, not in acute distress Cardiovascular: RRR, S1/S2 +, no edema Respiratory: CTA bilaterally, no wheezing, no rhonchi, no respiratory distress, no conversational dyspnea  Abdominal: Soft, NT, ND, bowel sounds + Extremities: no edema, no cyanosis Psych: Normal mood and affect, stable judgement and insight     The results of significant diagnostics from this hospitalization (including imaging, microbiology, ancillary and laboratory) are listed below for reference.     Microbiology: Recent Results (from the past 240 hour(s))  Urine Culture     Status: None   Collection Time: 10/17/22  9:14 AM   Specimen: Urine, Random  Result Value Ref Range Status   Specimen Description   Final    URINE, RANDOM Performed at [facility], [address] [place], [place] [address]    Special Requests   Final    NONE Performed at [facility], [address], [place], [place] [address]    Culture   Final    NO GROWTH Performed at [facility], [address] [place], [place] [address]    Report Status 10/19/2022 FINAL  Final  MRSA Next Gen by PCR, Nasal     Status: None   Collection Time: 10/18/22  8:37 PM   Specimen: Nasal Mucosa; Nasal Swab  Result Value Ref Range Status   MRSA by PCR Next Gen NOT DETECTED NOT DETECTED Final    Comment: (NOTE) The GeneXpert MRSA Assay (FDA approved for NASAL specimens only), is one component of a comprehensive MRSA colonization surveillance program. It is not intended to diagnose MRSA infection nor to guide or monitor treatment for MRSA infections. Test performance is not FDA approved in patients less than 26 years old. Performed at [facility], [address] [place], [place] [address]      Labs: BNP (last 3 results) No results for input(s): "BNP" in the last 8760 hours. Basic Metabolic Panel: Recent Labs  Lab  10/17/22 0841 10/18/22 0437 10/19/22 0558 10/20/22 0438  NA 137 139 139 135  K 4.2 3.7 3.4* 4.6  CL 103 103 107 106  CO2 26 27 27 25   GLUCOSE 145* 118* 136* 167*  BUN 17 11 8 11   CREATININE 0.82 0.91 0.75 0.94  CALCIUM 9.1 8.2* 7.8* 7.6*   Liver Function Tests: Recent Labs  Lab 10/17/22 0841 10/18/22 0437 10/19/22 0558 10/20/22 0438  AST 22 79* 24 49*  ALT 58* 135* 80* 80*  ALKPHOS 80 107 91 94  BILITOT 0.7 0.9 0.3 0.4  PROT 6.6 6.1* 5.6* 5.8*  ALBUMIN 3.6 3.3* 2.9* 3.0*   Recent Labs  Lab 10/17/22 0841 10/18/22 0437  LIPASE 303* 76*   No results for input(s): "AMMONIA" in the last 168 hours. CBC: Recent Labs  Lab 10/17/22 0841 10/18/22 0437 10/19/22 0558 10/20/22 0438  WBC 10.3 7.6 4.8 8.2  NEUTROABS 7.4  --   --   --   HGB 12.2 11.5* 10.5* 10.0*  HCT 35.7* 35.4* 32.6* 31.6*  MCV 94.7 98.1 98.5 99.4  PLT 285 257 216 224   Cardiac Enzymes: No results for input(s): "CKTOTAL", "CKMB", "CKMBINDEX", "TROPONINI" in the last 168 hours. BNP: Invalid input(s): "POCBNP" CBG: Recent Labs  Lab 10/19/22 0515 10/19/22 1101 10/19/22 2359 10/20/22 0533 10/20/22 1123  GLUCAP 142* 127* 268* 165* 183*   D-Dimer No results for input(s): "DDIMER" in the last 72 hours. Hgb A1c No results for input(s): "HGBA1C" in the last 72 hours. Lipid Profile No results for input(s): "CHOL", "HDL", "LDLCALC", "TRIG", "CHOLHDL", "LDLDIRECT" in the last 72 hours. Thyroid function studies No results for input(s): "TSH", "T4TOTAL", "T3FREE", "THYROIDAB" in the last 72 hours.  Invalid input(s): "FREET3" Anemia work up No results for input(s): "VITAMINB12", "FOLATE", "FERRITIN", "TIBC", "IRON", "RETICCTPCT" in the last 72 hours. Urinalysis    Component Value Date/Time   COLORURINE YELLOW 10/17/2022 0914   APPEARANCEUR CLOUDY (A) 10/17/2022 0914   LABSPEC 1.010 10/17/2022 0914   PHURINE 6.0 10/17/2022 0914   GLUCOSEU NEGATIVE 10/17/2022 0914   HGBUR NEGATIVE 10/17/2022 0914    BILIRUBINUR NEGATIVE 10/17/2022 0914   KETONESUR NEGATIVE 10/17/2022 0914   PROTEINUR NEGATIVE 10/17/2022 0914   NITRITE POSITIVE (A) 10/17/2022 0914   LEUKOCYTESUR LARGE (A) 10/17/2022 0914   Sepsis Labs Recent Labs  Lab 10/17/22 0841 10/18/22 0437 10/19/22 0558 10/20/22 0438  WBC 10.3 7.6 4.8 8.2   Microbiology Recent Results (from the past 240 hour(s))  Urine Culture     Status: None   Collection Time: 10/17/22  9:14 AM   Specimen: Urine, Random  Result Value Ref Range Status   Specimen Description   Final    URINE, RANDOM Performed at [facility], [address], [place], [place] [address]    Special Requests   Final    NONE Performed at [facility], [address], [place], [place] [address]    Culture   Final    NO GROWTH Performed at [facility], [address], [place], [place] [address]    Report Status 10/19/2022 FINAL  Final  MRSA Next Gen by PCR, Nasal     Status: None   Collection Time: 10/18/22  8:37 PM   Specimen: Nasal Mucosa; Nasal Swab  Result Value Ref Range Status   MRSA by PCR Next Gen NOT DETECTED NOT DETECTED Final    Comment: (NOTE) The GeneXpert MRSA Assay (FDA approved for NASAL specimens only), is one component of a comprehensive MRSA colonization surveillance program. It is not intended to diagnose MRSA infection nor to guide or monitor treatment for MRSA infections. Test performance is not FDA approved in patients less than 74 years old. Performed at [facility], [address], [place], [place] [address]      Patient was seen and examined on the day of discharge and was found to be in stable condition. Time coordinating discharge: 35 minutes including assessment and coordination of care, as well as examination of the patient.   SIGNED:  Noralee Stain, DO [facility] 10/20/2022, 1:34 PM

## 2022-10-20 NOTE — Progress Notes (Signed)
Discharge instructions provided to patient. All medications, follow up appointments, and discharge instructions provided. IV out. Discharging to Assisted Living Facility.

## 2022-10-20 NOTE — Progress Notes (Signed)
[facility]  Looking good. Ambulated. Tolerated clears. Wanting to eat more.   BP (!) 146/68 (BP Location: Right Arm)   Pulse 87   Temp 98.5 F (36.9 C)   Resp 18   Ht  (1.6 m)   Wt 72.6 kg   SpO2 93%   BMI 28.35 kg/m  Port sites c/d/I with dermabond no erythema or drainage Minimally distended, appropriately tender   Patient s/p laparoscopic cholecystectomy and IOC and liver biopsy for gallstone pancreatitis, cholecystitis and nodular liver.   PRN for pain Soft diet I will send in her roxicodone for Dr. Melissa Montane op call in a few weeks  Future Appointments  Date Time Provider Department Center  11/03/2022 11:45 AM Lucretia Roers, MD [facility] None     Algis Greenhouse, MD [facility] [address] [place], [place] [address] [phone] (office)

## 2022-10-21 LAB — SURGICAL PATHOLOGY

## 2022-10-23 ENCOUNTER — Encounter (HOSPITAL_COMMUNITY): Payer: Self-pay | Admitting: General Surgery

## 2022-10-26 ENCOUNTER — Emergency Department (HOSPITAL_COMMUNITY): Payer: 59

## 2022-10-26 ENCOUNTER — Inpatient Hospital Stay (HOSPITAL_COMMUNITY)
Admission: EM | Admit: 2022-10-26 | Discharge: 2022-11-02 | DRG: 394 | Disposition: A | Discharge status: Home / Self Care | Payer: 59 | Attending: Internal Medicine | Admitting: Internal Medicine

## 2022-10-26 ENCOUNTER — Encounter (HOSPITAL_COMMUNITY): Payer: Self-pay | Admitting: Emergency Medicine

## 2022-10-26 ENCOUNTER — Other Ambulatory Visit: Payer: Self-pay

## 2022-10-26 DIAGNOSIS — E785 Hyperlipidemia, unspecified: Secondary | ICD-10-CM | POA: Diagnosis present

## 2022-10-26 DIAGNOSIS — J449 Chronic obstructive pulmonary disease, unspecified: Secondary | ICD-10-CM | POA: Diagnosis present

## 2022-10-26 DIAGNOSIS — Z8262 Family history of osteoporosis: Secondary | ICD-10-CM

## 2022-10-26 DIAGNOSIS — K5900 Constipation, unspecified: Secondary | ICD-10-CM | POA: Diagnosis present

## 2022-10-26 DIAGNOSIS — G8918 Other acute postprocedural pain: Secondary | ICD-10-CM | POA: Diagnosis present

## 2022-10-26 DIAGNOSIS — Z7951 Long term (current) use of inhaled steroids: Secondary | ICD-10-CM

## 2022-10-26 DIAGNOSIS — E1165 Type 2 diabetes mellitus with hyperglycemia: Secondary | ICD-10-CM | POA: Diagnosis present

## 2022-10-26 DIAGNOSIS — Z79890 Hormone replacement therapy (postmenopausal): Secondary | ICD-10-CM

## 2022-10-26 DIAGNOSIS — I10 Essential (primary) hypertension: Secondary | ICD-10-CM | POA: Diagnosis present

## 2022-10-26 DIAGNOSIS — E871 Hypo-osmolality and hyponatremia: Secondary | ICD-10-CM | POA: Diagnosis present

## 2022-10-26 DIAGNOSIS — K5909 Other constipation: Secondary | ICD-10-CM | POA: Diagnosis present

## 2022-10-26 DIAGNOSIS — K9189 Other postprocedural complications and disorders of digestive system: Principal | ICD-10-CM | POA: Diagnosis present

## 2022-10-26 DIAGNOSIS — Z79899 Other long term (current) drug therapy: Secondary | ICD-10-CM

## 2022-10-26 DIAGNOSIS — F419 Anxiety disorder, unspecified: Secondary | ICD-10-CM | POA: Diagnosis present

## 2022-10-26 DIAGNOSIS — E119 Type 2 diabetes mellitus without complications: Secondary | ICD-10-CM

## 2022-10-26 DIAGNOSIS — Z8249 Family history of ischemic heart disease and other diseases of the circulatory system: Secondary | ICD-10-CM

## 2022-10-26 DIAGNOSIS — K838 Other specified diseases of biliary tract: Secondary | ICD-10-CM | POA: Diagnosis present

## 2022-10-26 DIAGNOSIS — Z7984 Long term (current) use of oral hypoglycemic drugs: Secondary | ICD-10-CM

## 2022-10-26 DIAGNOSIS — Z825 Family history of asthma and other chronic lower respiratory diseases: Secondary | ICD-10-CM

## 2022-10-26 DIAGNOSIS — Z833 Family history of diabetes mellitus: Secondary | ICD-10-CM

## 2022-10-26 DIAGNOSIS — D62 Acute posthemorrhagic anemia: Secondary | ICD-10-CM | POA: Diagnosis present

## 2022-10-26 DIAGNOSIS — Y838 Other surgical procedures as the cause of abnormal reaction of the patient, or of later complication, without mention of misadventure at the time of the procedure: Secondary | ICD-10-CM | POA: Diagnosis present

## 2022-10-26 DIAGNOSIS — N6310 Unspecified lump in the right breast, unspecified quadrant: Secondary | ICD-10-CM | POA: Diagnosis present

## 2022-10-26 DIAGNOSIS — K5730 Diverticulosis of large intestine without perforation or abscess without bleeding: Secondary | ICD-10-CM | POA: Diagnosis present

## 2022-10-26 DIAGNOSIS — Z7983 Long term (current) use of bisphosphonates: Secondary | ICD-10-CM

## 2022-10-26 DIAGNOSIS — T888XXA Other specified complications of surgical and medical care, not elsewhere classified, initial encounter: Secondary | ICD-10-CM | POA: Diagnosis present

## 2022-10-26 DIAGNOSIS — E039 Hypothyroidism, unspecified: Secondary | ICD-10-CM | POA: Diagnosis present

## 2022-10-26 DIAGNOSIS — R109 Unspecified abdominal pain: Secondary | ICD-10-CM | POA: Diagnosis present

## 2022-10-26 DIAGNOSIS — K219 Gastro-esophageal reflux disease without esophagitis: Secondary | ICD-10-CM | POA: Diagnosis present

## 2022-10-26 DIAGNOSIS — R0902 Hypoxemia: Secondary | ICD-10-CM | POA: Diagnosis present

## 2022-10-26 DIAGNOSIS — F17210 Nicotine dependence, cigarettes, uncomplicated: Secondary | ICD-10-CM | POA: Diagnosis present

## 2022-10-26 LAB — COMPREHENSIVE METABOLIC PANEL
ALT: 20 U/L (ref 0–44)
AST: 15 U/L (ref 15–41)
Albumin: 2.7 g/dL — ABNORMAL LOW (ref 3.5–5.0)
Alkaline Phosphatase: 98 U/L (ref 38–126)
Anion gap: 9 (ref 5–15)
BUN: 7 mg/dL — ABNORMAL LOW (ref 8–23)
CO2: 24 mmol/L (ref 22–32)
Calcium: 7.9 mg/dL — ABNORMAL LOW (ref 8.9–10.3)
Chloride: 101 mmol/L (ref 98–111)
Creatinine, Ser: 0.94 mg/dL (ref 0.44–1.00)
GFR, Estimated: >60 mL/min (ref >60)
Glucose, Bld: 229 mg/dL — ABNORMAL HIGH (ref 70–99)
Potassium: 3.5 mmol/L (ref 3.5–5.1)
Sodium: 134 mmol/L — ABNORMAL LOW (ref 135–145)
Total Bilirubin: 0.4 mg/dL (ref 0.3–1.2)
Total Protein: 6.1 g/dL — ABNORMAL LOW (ref 6.5–8.1)

## 2022-10-26 LAB — CBC
HCT: 28.3 % — ABNORMAL LOW (ref 36.0–46.0)
Hemoglobin: 9.4 g/dL — ABNORMAL LOW (ref 12.0–15.0)
MCH: 31.9 pg (ref 26.0–34.0)
MCHC: 33.2 g/dL (ref 30.0–36.0)
MCV: 95.9 fL (ref 80.0–100.0)
Platelets: 243 10*3/uL (ref 150–400)
RBC: 2.95 MIL/uL — ABNORMAL LOW (ref 3.87–5.11)
RDW: 13.2 % (ref 11.5–15.5)
WBC: 6.3 10*3/uL (ref 4.0–10.5)
nRBC: 0 % (ref 0.0–0.2)

## 2022-10-26 LAB — LIPASE, BLOOD: Lipase: 30 U/L (ref 11–51)

## 2022-10-26 MED ORDER — IOHEXOL 300 MG/ML  SOLN
100.0000 mL | Freq: Once | INTRAMUSCULAR | Status: AC | PRN
  Administered 2022-10-26: 100 mL via INTRAVENOUS

## 2022-10-26 NOTE — ED Provider Notes (Signed)
[place] EMERGENCY DEPARTMENT AT [facility]  Provider Note  CSN: [id number] Arrival date & time: 10/26/22 2211  History Chief Complaint  Patient presents with   Abdominal Pain    Kristin [name] is a 67 y.o. female with history of constipation, alcohol use, had a cholecystectomy about a week ago. Reports she has taken the last of her post-op pain medications and is having increased upper abdominal pain. No nausea or vomiting. Reports she has had a fever 'every other day' to 101F. She has not told her surgeon about this. She has not had a bowel movement since 4/9 (a week prior to surgery).    Home Medications Prior to Admission medications   Medication Sig Start Date End Date Taking? Authorizing Provider  albuterol (VENTOLIN HFA) 108 (90 Base) MCG/ACT inhaler Inhale 2 puffs into the lungs every 8 (eight) hours as needed for wheezing or shortness of breath. 03/19/22   [provider]  alendronate (FOSAMAX) 70 MG tablet Take 70 mg by mouth every Monday. 08/07/22   [provider]  calcium carbonate (TUMS EX) 750 MG chewable tablet Chew 1 tablet by mouth 3 (three) times daily as needed for heartburn (and/or indigestion).    [provider]  cloNIDine (CATAPRES) 0.1 MG tablet Take 0.1 mg by mouth 2 (two) times daily. 07/06/22   [provider]  cyanocobalamin (VITAMIN B12) 1000 MCG tablet Take 3,000 mcg by mouth daily.    [provider]  desvenlafaxine (PRISTIQ) 50 MG 24 hr tablet Take 50 mg by mouth daily. 04/16/22   [provider]  divalproex (DEPAKOTE SPRINKLE) 125 MG capsule Take 125 mg by mouth 2 (two) times daily.    [provider]  gabapentin (NEURONTIN) 600 MG tablet Take 600 mg by mouth 3 (three) times daily. 07/09/22   [provider]  guaiFENesin (ROBITUSSIN) 100 MG/5ML liquid Take 5 mLs by mouth every 8 (eight) hours as needed for cough or to loosen phlegm.    [provider]  ibuprofen  (ADVIL) 800 MG tablet Take 1 tablet (800 mg total) by mouth 3 (three) times daily. Patient taking differently: Take 400 mg by mouth every 6 (six) hours as needed for mild pain or moderate pain. 08/20/22   Particia Nearing, PA-C  levothyroxine (SYNTHROID) 88 MCG tablet Take 88 mcg by mouth daily before breakfast.    [provider]  Liniments (V-R PAIN RELIEVING) GEL Apply 1 Application topically 2 (two) times daily after a meal. Applied to lower back as directed    [provider]  lisinopril (PRINIVIL,ZESTRIL) 20 MG tablet Take 20 mg by mouth daily.    [provider]  nystatin (MYCOSTATIN) 100000 UNIT/ML suspension Take 5 mLs by mouth 4 (four) times daily. 10 DAY COURSE STARTING ON 10/12/22 AND TO BE COMPLETED ON 10/22/2022    [provider]  ondansetron (ZOFRAN) 4 MG tablet Take 1 tablet (4 mg total) by mouth every 6 (six) hours as needed for nausea. 10/20/22   Lucretia Roers, MD  oxyCODONE (OXY IR/ROXICODONE) 5 MG immediate release tablet Take 1 tablet (5 mg total) by mouth every 4 (four) hours as needed for severe pain or breakthrough pain. 10/20/22   Lucretia Roers, MD  pantoprazole (PROTONIX) 40 MG tablet Take 40 mg by mouth daily. 07/06/22   [provider]  pravastatin (PRAVACHOL) 40 MG tablet Take 40 mg by mouth daily. 07/06/22   [provider]  psyllium (REGULOID) 0.52 g capsule Take  0.52 g by mouth at bedtime.    [provider]  ramelteon (ROZEREM) 8 MG tablet Take 8 mg by mouth at bedtime.    [provider]  tiZANidine (ZANAFLEX) 4 MG tablet Take 4 mg by mouth every 8 (eight) hours as needed for muscle spasms.    [provider]  Dwyane Luo 200-62.5-25 MCG/ACT AEPB Inhale 1 puff into the lungs daily. *RINSE MOUTH WITH WATER AND SPIT DAILY 08/06/22   [provider]  zolpidem (AMBIEN CR) 12.5 MG CR tablet Take 12.5 mg by mouth at bedtime.    [provider]     Allergies     Patient has no known allergies.   Review of Systems   Review of Systems Please see HPI for pertinent positives and negatives  Physical Exam BP (!) 198/114   Pulse (!) 59   Temp 98.4 F (36.9 C) (Oral)   Resp 19   Ht  (1.6 m)   Wt 72.6 kg   SpO2 97%   BMI 28.35 kg/m   Physical Exam Vitals and nursing note reviewed.  Constitutional:      Appearance: Normal appearance.  HENT:     Head: Normocephalic and atraumatic.     Nose: Nose normal.     Mouth/Throat:     Mouth: Mucous membranes are moist.  Eyes:     Extraocular Movements: Extraocular movements intact.     Conjunctiva/sclera: Conjunctivae normal.  Cardiovascular:     Rate and Rhythm: Normal rate.  Pulmonary:     Effort: Pulmonary effort is normal.     Breath sounds: Normal breath sounds.  Abdominal:     General: Abdomen is flat.     Palpations: Abdomen is soft.     Tenderness: There is generalized abdominal tenderness. There is no guarding. Negative signs include Murphy's sign and McBurney's sign.     Comments: Well healing surgical wounds  Musculoskeletal:        General: No swelling. Normal range of motion.     Cervical back: Neck supple.  Skin:    General: Skin is warm and dry.  Neurological:     General: No focal deficit present.     Mental Status: She is alert.  Psychiatric:        Mood and Affect: Mood normal.     ED Results / Procedures / Treatments   EKG None  Procedures Procedures  Medications Ordered in the ED Medications  iohexol (OMNIPAQUE) 300 MG/ML solution 100 mL (100 mLs Intravenous Contrast Given 10/26/22 2359)  fentaNYL (SUBLIMAZE) injection 50 mcg (50 mcg Intravenous Given 10/27/22 0215)    Initial Impression and Plan  Patient here with diffuse abdominal pain. Sleeping soundly on my initial evaluation in no distress. She reports no stool output in 2 weeks but also having a fever at home. Consider post-op complication, infection or constipation. Labs done in triage show CBC  with normal WBC, anemia is about at baseline. CMP and lipase are also at baseline. Will check CT to rule out acute post-op complication. Otherwise she is well appearing, denies nausea. Avoid opiates given possible constipation as source of pain.   ED Course   Clinical Course as of 10/27/22 0220  Tue Oct 27, 2022  0112 I personally viewed the images from radiology studies and agree with radiologist interpretation: CT shows a large fluid collection in gall bladder fossa. Patient also had a wedge liver biopsy during her surgery so could be a hematoma vs seroma. Does not  appear to be abscess. Will discuss with General Surgery.  [CS]  1 Spoke with Dr. Lovell Sheehan, Gen Surgery, who requests hospitalist admission and they will consult in the AM.  [CS]  0219 Spoke with Dr. Thomes Dinning, Hospitalist, who will evaluate for admission.  [CS]    Clinical Course User Index [CS] Pollyann Savoy, MD     MDM Rules/Calculators/A&P Medical Decision Making Problems Addressed: Fluid collection at surgical site, initial encounter: acute illness or injury Post-operative pain: acute illness or injury  Amount and/or Complexity of Data Reviewed Labs: ordered. Decision-making details documented in ED Course. Radiology: ordered and independent interpretation performed. Decision-making details documented in ED Course.  Risk Prescription drug management. Decision regarding hospitalization.     Final Clinical Impression(s) / ED Diagnoses Final diagnoses:  Post-operative pain  Fluid collection at surgical site, initial encounter    Rx / DC Orders ED Discharge Orders     None        Pollyann Savoy, MD 10/27/22 [phone]

## 2022-10-26 NOTE — ED Triage Notes (Signed)
Pt c/o abd pain since gallbladder surgery 4/15. Pt states she took her last pain meds today and has not had a bowel movement in a week.

## 2022-10-27 ENCOUNTER — Inpatient Hospital Stay (HOSPITAL_COMMUNITY): Payer: 59

## 2022-10-27 ENCOUNTER — Encounter (HOSPITAL_COMMUNITY): Payer: Self-pay | Admitting: Internal Medicine

## 2022-10-27 DIAGNOSIS — J9811 Atelectasis: Secondary | ICD-10-CM | POA: Diagnosis not present

## 2022-10-27 DIAGNOSIS — T888XXA Other specified complications of surgical and medical care, not elsewhere classified, initial encounter: Secondary | ICD-10-CM | POA: Diagnosis not present

## 2022-10-27 DIAGNOSIS — Y838 Other surgical procedures as the cause of abnormal reaction of the patient, or of later complication, without mention of misadventure at the time of the procedure: Secondary | ICD-10-CM | POA: Diagnosis present

## 2022-10-27 DIAGNOSIS — G8918 Other acute postprocedural pain: Secondary | ICD-10-CM | POA: Diagnosis present

## 2022-10-27 DIAGNOSIS — Z79890 Hormone replacement therapy (postmenopausal): Secondary | ICD-10-CM | POA: Diagnosis not present

## 2022-10-27 DIAGNOSIS — K9189 Other postprocedural complications and disorders of digestive system: Secondary | ICD-10-CM | POA: Diagnosis present

## 2022-10-27 DIAGNOSIS — D649 Anemia, unspecified: Secondary | ICD-10-CM | POA: Diagnosis not present

## 2022-10-27 DIAGNOSIS — K5909 Other constipation: Secondary | ICD-10-CM | POA: Diagnosis present

## 2022-10-27 DIAGNOSIS — D62 Acute posthemorrhagic anemia: Secondary | ICD-10-CM | POA: Diagnosis present

## 2022-10-27 DIAGNOSIS — E871 Hypo-osmolality and hyponatremia: Secondary | ICD-10-CM | POA: Diagnosis present

## 2022-10-27 DIAGNOSIS — Z7984 Long term (current) use of oral hypoglycemic drugs: Secondary | ICD-10-CM | POA: Diagnosis not present

## 2022-10-27 DIAGNOSIS — K5900 Constipation, unspecified: Secondary | ICD-10-CM | POA: Diagnosis not present

## 2022-10-27 DIAGNOSIS — E119 Type 2 diabetes mellitus without complications: Secondary | ICD-10-CM | POA: Diagnosis not present

## 2022-10-27 DIAGNOSIS — J449 Chronic obstructive pulmonary disease, unspecified: Secondary | ICD-10-CM | POA: Diagnosis present

## 2022-10-27 DIAGNOSIS — F419 Anxiety disorder, unspecified: Secondary | ICD-10-CM | POA: Diagnosis present

## 2022-10-27 DIAGNOSIS — R109 Unspecified abdominal pain: Secondary | ICD-10-CM | POA: Diagnosis present

## 2022-10-27 DIAGNOSIS — E039 Hypothyroidism, unspecified: Secondary | ICD-10-CM | POA: Diagnosis present

## 2022-10-27 DIAGNOSIS — R0902 Hypoxemia: Secondary | ICD-10-CM | POA: Diagnosis present

## 2022-10-27 DIAGNOSIS — Z825 Family history of asthma and other chronic lower respiratory diseases: Secondary | ICD-10-CM | POA: Diagnosis not present

## 2022-10-27 DIAGNOSIS — E1165 Type 2 diabetes mellitus with hyperglycemia: Secondary | ICD-10-CM | POA: Diagnosis present

## 2022-10-27 DIAGNOSIS — E785 Hyperlipidemia, unspecified: Secondary | ICD-10-CM | POA: Diagnosis present

## 2022-10-27 DIAGNOSIS — K219 Gastro-esophageal reflux disease without esophagitis: Secondary | ICD-10-CM | POA: Diagnosis present

## 2022-10-27 DIAGNOSIS — F17210 Nicotine dependence, cigarettes, uncomplicated: Secondary | ICD-10-CM | POA: Diagnosis present

## 2022-10-27 DIAGNOSIS — Z79899 Other long term (current) drug therapy: Secondary | ICD-10-CM | POA: Diagnosis not present

## 2022-10-27 DIAGNOSIS — Z7951 Long term (current) use of inhaled steroids: Secondary | ICD-10-CM | POA: Diagnosis not present

## 2022-10-27 DIAGNOSIS — Z8262 Family history of osteoporosis: Secondary | ICD-10-CM | POA: Diagnosis not present

## 2022-10-27 DIAGNOSIS — K838 Other specified diseases of biliary tract: Secondary | ICD-10-CM | POA: Diagnosis not present

## 2022-10-27 DIAGNOSIS — K5730 Diverticulosis of large intestine without perforation or abscess without bleeding: Secondary | ICD-10-CM | POA: Diagnosis present

## 2022-10-27 DIAGNOSIS — Z833 Family history of diabetes mellitus: Secondary | ICD-10-CM | POA: Diagnosis not present

## 2022-10-27 DIAGNOSIS — I10 Essential (primary) hypertension: Secondary | ICD-10-CM | POA: Diagnosis present

## 2022-10-27 DIAGNOSIS — R1011 Right upper quadrant pain: Secondary | ICD-10-CM | POA: Diagnosis not present

## 2022-10-27 DIAGNOSIS — Z7983 Long term (current) use of bisphosphonates: Secondary | ICD-10-CM | POA: Diagnosis not present

## 2022-10-27 DIAGNOSIS — Z8249 Family history of ischemic heart disease and other diseases of the circulatory system: Secondary | ICD-10-CM | POA: Diagnosis not present

## 2022-10-27 LAB — HEMOGLOBIN A1C
Hgb A1c MFr Bld: 7.2 % — ABNORMAL HIGH (ref 4.8–5.6)
Mean Plasma Glucose: 159.94 mg/dL

## 2022-10-27 LAB — URINALYSIS, ROUTINE W REFLEX MICROSCOPIC
Bilirubin Urine: NEGATIVE
Glucose, UA: NEGATIVE mg/dL
Hgb urine dipstick: NEGATIVE
Ketones, ur: NEGATIVE mg/dL
Leukocytes,Ua: NEGATIVE
Nitrite: NEGATIVE
Protein, ur: NEGATIVE mg/dL
Specific Gravity, Urine: 1.023 (ref 1.005–1.030)
pH: 7 (ref 5.0–8.0)

## 2022-10-27 MED ORDER — DIVALPROEX SODIUM 125 MG PO CSDR
125.0000 mg | DELAYED_RELEASE_CAPSULE | Freq: Two times a day (BID) | ORAL | Status: DC
  Administered 2022-10-27 – 2022-11-02 (×12): 125 mg via ORAL
  Filled 2022-10-27 (×13): qty 1

## 2022-10-27 MED ORDER — CLONIDINE HCL 0.1 MG PO TABS
0.1000 mg | ORAL_TABLET | Freq: Two times a day (BID) | ORAL | Status: DC
  Administered 2022-10-27 – 2022-11-02 (×13): 0.1 mg via ORAL
  Filled 2022-10-27 (×13): qty 1

## 2022-10-27 MED ORDER — FENTANYL CITRATE PF 50 MCG/ML IJ SOSY
50.0000 ug | PREFILLED_SYRINGE | Freq: Once | INTRAMUSCULAR | Status: AC
  Administered 2022-10-27: 50 ug via INTRAVENOUS
  Filled 2022-10-27: qty 1

## 2022-10-27 MED ORDER — ACETAMINOPHEN 650 MG RE SUPP: 650.0000 mg | Freq: Four times a day (QID) | RECTAL | Status: DC | PRN

## 2022-10-27 MED ORDER — MORPHINE SULFATE (PF) 2 MG/ML IV SOLN
2.0000 mg | INTRAVENOUS | Status: DC | PRN
  Administered 2022-10-27 – 2022-10-31 (×3): 2 mg via INTRAVENOUS
  Filled 2022-10-27 (×4): qty 1

## 2022-10-27 MED ORDER — BISACODYL 10 MG RE SUPP
10.0000 mg | Freq: Every day | RECTAL | Status: DC | PRN
  Administered 2022-10-28 – 2022-10-31 (×2): 10 mg via RECTAL
  Filled 2022-10-27 (×3): qty 1

## 2022-10-27 MED ORDER — PRAVASTATIN SODIUM 40 MG PO TABS
40.0000 mg | ORAL_TABLET | Freq: Every day | ORAL | Status: DC
  Administered 2022-10-27 – 2022-11-02 (×7): 40 mg via ORAL
  Filled 2022-10-27 (×7): qty 1

## 2022-10-27 MED ORDER — TECHNETIUM TC 99M MEBROFENIN IV KIT
5.0000 | PACK | Freq: Once | INTRAVENOUS | Status: AC | PRN
  Administered 2022-10-27: 5.5 via INTRAVENOUS

## 2022-10-27 MED ORDER — LISINOPRIL 20 MG PO TABS
20.0000 mg | ORAL_TABLET | Freq: Every day | ORAL | Status: DC
  Administered 2022-10-27 – 2022-11-02 (×7): 20 mg via ORAL
  Filled 2022-10-27 (×5): qty 1
  Filled 2022-10-27: qty 2
  Filled 2022-10-27: qty 1

## 2022-10-27 MED ORDER — SODIUM CHLORIDE 0.9% FLUSH
3.0000 mL | Freq: Two times a day (BID) | INTRAVENOUS | Status: DC
  Administered 2022-10-27 – 2022-11-01 (×11): 3 mL via INTRAVENOUS

## 2022-10-27 MED ORDER — HEPARIN SODIUM (PORCINE) 5000 UNIT/ML IJ SOLN
5000.0000 [IU] | Freq: Three times a day (TID) | INTRAMUSCULAR | Status: AC
  Administered 2022-10-27 – 2022-10-29 (×4): 5000 [IU] via SUBCUTANEOUS
  Filled 2022-10-27 (×6): qty 1

## 2022-10-27 MED ORDER — VENLAFAXINE HCL ER 75 MG PO CP24
75.0000 mg | ORAL_CAPSULE | Freq: Every day | ORAL | Status: DC
  Administered 2022-10-27 – 2022-11-02 (×7): 75 mg via ORAL
  Filled 2022-10-27 (×7): qty 1

## 2022-10-27 MED ORDER — ONDANSETRON HCL 4 MG PO TABS: 4.0000 mg | ORAL_TABLET | Freq: Four times a day (QID) | ORAL | Status: DC | PRN

## 2022-10-27 MED ORDER — ZOLPIDEM TARTRATE 5 MG PO TABS
5.0000 mg | ORAL_TABLET | Freq: Every day | ORAL | Status: DC
  Administered 2022-10-27 – 2022-11-01 (×6): 5 mg via ORAL
  Filled 2022-10-27 (×6): qty 1

## 2022-10-27 MED ORDER — PANTOPRAZOLE SODIUM 40 MG IV SOLR
40.0000 mg | Freq: Two times a day (BID) | INTRAVENOUS | Status: DC
  Administered 2022-10-27 – 2022-10-28 (×3): 40 mg via INTRAVENOUS
  Filled 2022-10-27 (×3): qty 10

## 2022-10-27 MED ORDER — KETOROLAC TROMETHAMINE 15 MG/ML IJ SOLN
15.0000 mg | Freq: Four times a day (QID) | INTRAMUSCULAR | Status: DC | PRN
  Administered 2022-10-28 – 2022-10-29 (×4): 15 mg via INTRAVENOUS
  Filled 2022-10-27 (×5): qty 1

## 2022-10-27 MED ORDER — RAMELTEON 8 MG PO TABS
8.0000 mg | ORAL_TABLET | Freq: Every day | ORAL | Status: DC
  Administered 2022-10-27 – 2022-11-01 (×6): 8 mg via ORAL
  Filled 2022-10-27 (×7): qty 1

## 2022-10-27 MED ORDER — POLYETHYLENE GLYCOL 3350 17 G PO PACK
17.0000 g | PACK | Freq: Every day | ORAL | Status: DC
Start: 2022-10-28 — End: 2022-11-02
  Administered 2022-10-29 – 2022-11-02 (×5): 17 g via ORAL
  Filled 2022-10-27 (×7): qty 1

## 2022-10-27 MED ORDER — NYSTATIN 100000 UNIT/ML MT SUSP
5.0000 mL | Freq: Four times a day (QID) | OROMUCOSAL | Status: DC
  Administered 2022-10-27 – 2022-11-02 (×20): 500000 [IU] via ORAL
  Filled 2022-10-27 (×28): qty 5

## 2022-10-27 MED ORDER — FLUTICASONE FUROATE-VILANTEROL 200-25 MCG/ACT IN AEPB
1.0000 | INHALATION_SPRAY | Freq: Every day | RESPIRATORY_TRACT | Status: DC
  Administered 2022-10-27 – 2022-11-02 (×7): 1 via RESPIRATORY_TRACT
  Filled 2022-10-27: qty 28

## 2022-10-27 MED ORDER — DOCUSATE SODIUM 100 MG PO CAPS
100.0000 mg | ORAL_CAPSULE | Freq: Two times a day (BID) | ORAL | Status: DC
  Administered 2022-10-27 – 2022-11-02 (×13): 100 mg via ORAL
  Filled 2022-10-27 (×13): qty 1

## 2022-10-27 MED ORDER — HYDRALAZINE HCL 20 MG/ML IJ SOLN: 5.0000 mg | INTRAMUSCULAR | Status: DC | PRN

## 2022-10-27 MED ORDER — SODIUM CHLORIDE 0.9 % IV SOLN
3.0000 g | Freq: Four times a day (QID) | INTRAVENOUS | Status: DC
Start: 2022-10-27 — End: 2022-11-01
  Administered 2022-10-27 – 2022-11-01 (×19): 3 g via INTRAVENOUS
  Filled 2022-10-27 (×20): qty 8

## 2022-10-27 MED ORDER — UMECLIDINIUM BROMIDE 62.5 MCG/ACT IN AEPB
1.0000 | INHALATION_SPRAY | Freq: Every day | RESPIRATORY_TRACT | Status: DC
  Administered 2022-10-27 – 2022-11-02 (×7): 1 via RESPIRATORY_TRACT
  Filled 2022-10-27: qty 7

## 2022-10-27 MED ORDER — GABAPENTIN 300 MG PO CAPS
600.0000 mg | ORAL_CAPSULE | Freq: Three times a day (TID) | ORAL | Status: DC
  Administered 2022-10-27 – 2022-11-02 (×17): 600 mg via ORAL
  Filled 2022-10-27 (×17): qty 2

## 2022-10-27 MED ORDER — LEVOTHYROXINE SODIUM 88 MCG PO TABS
88.0000 ug | ORAL_TABLET | Freq: Every day | ORAL | Status: DC
  Administered 2022-10-29 – 2022-11-02 (×4): 88 ug via ORAL
  Filled 2022-10-27 (×5): qty 1

## 2022-10-27 MED ORDER — ONDANSETRON HCL 4 MG/2ML IJ SOLN: 4.0000 mg | Freq: Four times a day (QID) | INTRAMUSCULAR | Status: DC | PRN

## 2022-10-27 MED ORDER — ACETAMINOPHEN 325 MG PO TABS
650.0000 mg | ORAL_TABLET | Freq: Four times a day (QID) | ORAL | Status: DC | PRN
  Administered 2022-10-27 – 2022-11-02 (×3): 650 mg via ORAL
  Filled 2022-10-27 (×4): qty 2

## 2022-10-27 MED ORDER — PANTOPRAZOLE SODIUM 40 MG PO TBEC: 40.0000 mg | DELAYED_RELEASE_TABLET | Freq: Every day | ORAL | Status: DC

## 2022-10-27 NOTE — Progress Notes (Signed)
[facility]  Patient known to me s/p laparoscopic cholecystectomy with [facility] 10/19/22 for gallstone pancreatitis, IOC was negative. She also had a nodular liver and I biopsied it. Pathology back with chronic cholecystitis. She came in with fevers  and some abdominal bloating, constipation. CT with a fluid collection in the fossa and around the liver. No leukocytosis, LFTs reassuring.    BP (!) 159/71 (BP Location: Left Arm)   Pulse 83   Temp 99.3 F (37.4 C) (Oral)   Resp 20   Ht  (1.6 m)   Wt 71.3 kg   SpO2 93%   BMI 27.84 kg/m  Distended, mildly tender, port sites c/d/I with no erythema or drainage  HIDA obtained to rule out bile leak and it is positive.  IR has been consulted for drain placement, Dr. Germaine Pomfret has looked at images. Will need GI and ERCP. She is admitted to the hospitalist and is getting transferred to get GI to evaluate.  With the fevers and biloma would cover with antibiotics for now given the risk of this being infected. IR can culture.  Will transfer to [facility]. Will see her as an outpatient.   Algis Greenhouse, MD [facility] [address] [place], [place] [address] [phone] (office)

## 2022-10-27 NOTE — Progress Notes (Signed)
Pharmacy Antibiotic Note  Kristin [name] is a 67 y.o. female admitted on 10/26/2022 with  bile leak . Pharmacy has been consulted for unasyn dosing.  Patient afebrile, wbc 6. Renal function normal  Plan: Unasyn 3g IV q 6 hours  Height:  (160 cm) Weight: 71.3 kg (157 lb 3 oz) IBW/kg (Calculated) : 52.4  Temp (24hrs), Avg:98.6 F (37 C), Min:98.2 F (36.8 C), Max:99.3 F (37.4 C)  Recent Labs  Lab 10/26/22 2020  WBC 6.3  CREATININE 0.94    Estimated Creatinine Clearance: 56.5 mL/min (by C-G formula based on SCr of 0.94 mg/dL).    No Known Allergies  Thank you for allowing pharmacy to be a part of this patient's care.  Sheppard Coil PharmD., BCPS Clinical Pharmacist 10/27/2022 11:47 AM

## 2022-10-27 NOTE — Progress Notes (Signed)
[facility]  In my office seeing patients. Called to notify her of the bile leak on HIDA scan and plan. Dr. Benjamine Mola is going to update her too.   Will see her in 2 weeks.  Explained to her that she will be getting and IR drain and GI for a stent.  Future Appointments  Date Time Provider Department Center  11/11/2022  3:30 PM Lucretia Roers, MD [facility] None     Algis Greenhouse, MD [facility] [address], [place] [address] [phone] (office)

## 2022-10-27 NOTE — TOC Initial Note (Signed)
Transition of Care [facility]) - Initial/Assessment Note    Patient Details  Name: Kristin [name] MRN: [id number] Date of Birth: 1955-08-10  [facility] CM/SW Contact:    Villa Herb, LCSWA Phone Number: 10/27/2022, 11:56 AM  Clinical Narrative:                 Pt admitted from [facility]. CSW spoke to Logan with the ALF. Facility will need to come assess pt prior to discharge from hospital. CSW to work on [employer]. TOC to update facility tomorrow about when would be best for assessment to be completed. TOC to follow.   Expected Discharge Plan: Assisted Living Barriers to Discharge: Continued Medical Work up   Patient Goals and CMS Choice Patient states their goals for this hospitalization and ongoing recovery are:: return to ALF CMS Medicare.gov Compare Post Acute Care list provided to:: Patient Choice offered to / list presented to : Patient      Expected Discharge Plan and Services In-house Referral: Clinical Social Work Discharge Planning Services: CM Consult   Living arrangements for the past 2 months: Assisted Living Facility                                      Prior Living Arrangements/Services Living arrangements for the past 2 months: Assisted Living Facility Lives with:: Facility Resident Patient language and need for interpreter reviewed:: Yes Do you feel safe going back to the place where you live?: Yes      Need for Family Participation in Patient Care: Yes (Comment) Care giver support system in place?: Yes (comment)   Criminal Activity/Legal Involvement Pertinent to Current Situation/Hospitalization: No - Comment as needed  Activities of Daily Living Home Assistive Devices/Equipment: None ADL Screening (condition at time of admission) Patient's cognitive ability adequate to safely complete daily activities?: Yes Is the patient deaf or have difficulty hearing?: No Does the patient have difficulty seeing, even when wearing  glasses/contacts?: No Does the patient have difficulty concentrating, remembering, or making decisions?: No Patient able to express need for assistance with ADLs?: Yes Does the patient have difficulty dressing or bathing?: No Independently performs ADLs?: Yes (appropriate for developmental age) Does the patient have difficulty walking or climbing stairs?: No Weakness of Legs: None Weakness of Arms/Hands: None  Permission Sought/Granted                  Emotional Assessment Appearance:: Appears stated age Attitude/Demeanor/Rapport: Engaged Affect (typically observed): Accepting Orientation: : Oriented to Self, Oriented to Place, Oriented to  Time, Oriented to Situation Alcohol / Substance Use: Not Applicable Psych Involvement: No (comment)  Admission diagnosis:  Post-operative pain [G89.18] Abdominal pain [R10.9] Fluid collection at surgical site, initial encounter [T88.8XXA] Patient Active Problem List   Diagnosis Date Noted   Abdominal pain 10/27/2022   Nodular hyperplasia of liver 10/19/2022   Gallstone pancreatitis 10/18/2022   Breast mass, right-Needs mammogram 10/17/2022   Cholelithiasis and acute cholecystitis without obstruction 10/17/2022   Hypothyroidism 10/17/2022   COPD without exacerbation 10/17/2022   Tobacco abuse 10/17/2022   HTN (hypertension) 10/17/2022   Constipation 10/17/2022   HLD (hyperlipidemia) 10/17/2022   PCP:  Catalina Lunger, DO Pharmacy:   [facility] - [place], [place] - [address] [place] [address] Phone: [phone] Fax: [phone]  [facility] - [place], [place] - [address] [place] [address] Phone: [phone] Fax: [phone]     Social Determinants of Health (SDOH) Social History: SDOH Screenings   Food Insecurity: No Food Insecurity (10/27/2022)  Housing: Low Risk  (10/27/2022)  Transportation Needs: No Transportation Needs  (10/27/2022)  Utilities: Not At Risk (10/27/2022)  Tobacco Use: High Risk (10/27/2022)   SDOH Interventions:     Readmission Risk Interventions     No data to display

## 2022-10-27 NOTE — H&P (Signed)
History and Physical    Patient: Kristin [name]:[id number] DOB: 12-Jan-1956 DOA: 10/26/2022 DOS: the patient was seen and examined on 10/27/2022 PCP: Catalina Lunger, DO  Patient coming from: Home  Chief Complaint:  Chief Complaint  Patient presents with   Abdominal Pain   HPI: Kristin [name] is a 67 y.o. female with medical history significant of COPD, hypertension, hypothyroidism, dyslipidemia, anxiety.  Patient was recently discharged from the hospital on 4/16 after presenting with acute cholecystitis with biliary pancreatitis.  She is status post laparoscopic cholecystectomy.  Patient reported that ever since her surgery she has had significant abdominal pain that required frequent use of her prescribed pain medications.  After she ran out of her pain medications her pain increased dramatically and she presented to the ED for evaluation.  Of note since surgery she has not passed a bowel movement.  She denies passing any flatus.  No nausea and vomiting.  She has reported some fevers up to 101.1 better waxing and waning in nature.  She reports her stomach is more distended than normal.  In the ED she was afebrile and hypertensive in the context of ongoing abdominal pain.  Her initial O2 sats were slightly low at 82 to 86% and have improved to 96 to 100% on 2 L nasal cannula oxygen.  Labs revealed mild hyponatremia with hyperglycemia glucose 229, normal renal function, slightly low calcium at 7.9 but also with a low albumin.  LFTs normal.  No leukocytosis.  Hemoglobin 9.4.  Platelets are normal.  Urinalysis unremarkable.  CT abdomen and pelvis revealed mild to moderate severe areas of atelectasis within the bilateral lung bases with a small right pleural effusion.  She was also found to have a fluid collection in the gallbladder fossa measuring 7.3 x 6.2 x 4.5 cm this area is well-defined.  Common bile duct 12 mm.  No other significant findings.  Incidental finding of sigmoid diverticulosis.   Surgeon on-call Dr. Lovell Sheehan was notified and recommended hospitalist team admit.  Concern is over possible bile leak versus hematoma versus seroma.   Past Medical History:  Diagnosis Date   Anxiety    GERD (gastroesophageal reflux disease)    History of kidney stones    Hypertension    Hypothyroidism    Thyroid disease    Past Surgical History:  Procedure Laterality Date   CARPAL TUNNEL RELEASE Left 10/28/2016   Procedure: CARPAL TUNNEL RELEASE;  Surgeon: Vickki Hearing, MD;  Location: [facility];  Service: Orthopedics;  Laterality: Left;   CESAREAN SECTION     CHOLECYSTECTOMY N/A 10/19/2022   Procedure: LAPAROSCOPIC CHOLECYSTECTOMY WITH INTRAOPERATIVE CHOLANGIOGRAM;  Surgeon: Lucretia Roers, MD;  Location: [facility];  Service: General;  Laterality: N/A;   ELBOW SURGERY     right   FINGER SURGERY     left pinky finger   HEMATOMA EVACUATION     left hip   KNEE SURGERY     right   LIVER BIOPSY  10/19/2022   Procedure: LIVER BIOPSY;  Surgeon: Lucretia Roers, MD;  Location: [facility];  Service: General;;   TONSILLECTOMY     Social History:  reports that she has been smoking cigarettes. She has a 7.50 pack-year smoking history. She has never used smokeless tobacco. She reports current alcohol use of about 6.0 standard drinks of alcohol per week. She reports that she does not use drugs.  No Known Allergies  Family History  Problem Relation Age of Onset   Diabetes Mother  COPD Mother    Heart attack Mother    Diabetes Father    Emphysema Father    Heart attack Father    Kidney disease Maternal Aunt    Osteoporosis Maternal Aunt     Prior to Admission medications   Medication Sig Start Date End Date Taking? Authorizing Provider  albuterol (VENTOLIN HFA) 108 (90 Base) MCG/ACT inhaler Inhale 2 puffs into the lungs every 8 (eight) hours as needed for wheezing or shortness of breath. 03/19/22   [provider]  alendronate (FOSAMAX) 70 MG tablet Take 70 mg by mouth  every Monday. 08/07/22   [provider]  calcium carbonate (TUMS EX) 750 MG chewable tablet Chew 1 tablet by mouth 3 (three) times daily as needed for heartburn (and/or indigestion).    [provider]  cloNIDine (CATAPRES) 0.1 MG tablet Take 0.1 mg by mouth 2 (two) times daily. 07/06/22   [provider]  cyanocobalamin (VITAMIN B12) 1000 MCG tablet Take 3,000 mcg by mouth daily.    [provider]  desvenlafaxine (PRISTIQ) 50 MG 24 hr tablet Take 50 mg by mouth daily. 04/16/22   [provider]  divalproex (DEPAKOTE SPRINKLE) 125 MG capsule Take 125 mg by mouth 2 (two) times daily.    [provider]  gabapentin (NEURONTIN) 600 MG tablet Take 600 mg by mouth 3 (three) times daily. 07/09/22   [provider]  guaiFENesin (ROBITUSSIN) 100 MG/5ML liquid Take 5 mLs by mouth every 8 (eight) hours as needed for cough or to loosen phlegm.    [provider]  ibuprofen (ADVIL) 800 MG tablet Take 1 tablet (800 mg total) by mouth 3 (three) times daily. Patient taking differently: Take 400 mg by mouth every 6 (six) hours as needed for mild pain or moderate pain. 08/20/22   Particia Nearing, PA-C  levothyroxine (SYNTHROID) 88 MCG tablet Take 88 mcg by mouth daily before breakfast.    [provider]  Liniments (V-R PAIN RELIEVING) GEL Apply 1 Application topically 2 (two) times daily after a meal. Applied to lower back as directed    [provider]  lisinopril (PRINIVIL,ZESTRIL) 20 MG tablet Take 20 mg by mouth daily.    [provider]  nystatin (MYCOSTATIN) 100000 UNIT/ML suspension Take 5 mLs by mouth 4 (four) times daily. 10 DAY COURSE STARTING ON 10/12/22 AND TO BE COMPLETED ON 10/22/2022    [provider]  ondansetron (ZOFRAN) 4 MG tablet Take 1 tablet (4 mg total) by mouth every 6 (six) hours as needed for nausea. 10/20/22   Lucretia Roers, MD  oxyCODONE (OXY IR/ROXICODONE) 5 MG immediate release  tablet Take 1 tablet (5 mg total) by mouth every 4 (four) hours as needed for severe pain or breakthrough pain. 10/20/22   Lucretia Roers, MD  pantoprazole (PROTONIX) 40 MG tablet Take 40 mg by mouth daily. 07/06/22   [provider]  pravastatin (PRAVACHOL) 40 MG tablet Take 40 mg by mouth daily. 07/06/22   [provider]  psyllium (REGULOID) 0.52 g capsule Take 0.52 g by mouth at bedtime.    [provider]  ramelteon (ROZEREM) 8 MG tablet Take 8 mg by mouth at bedtime.    [provider]  tiZANidine (ZANAFLEX) 4 MG tablet Take 4 mg by mouth every 8 (eight) hours as needed for muscle spasms.    [provider]  Dwyane Luo 200-62.5-25 MCG/ACT AEPB Inhale 1 puff into the lungs daily. *RINSE MOUTH WITH WATER AND  SPIT DAILY 08/06/22   [provider]  zolpidem (AMBIEN CR) 12.5 MG CR tablet Take 12.5 mg by mouth at bedtime.    [provider]    Physical Exam: Vitals:   10/26/22 2215 10/26/22 2217 10/27/22 0215 10/27/22 0230  BP:  (!) 146/66 (!) 198/114 (!) 187/80  Pulse:  (!) 57 (!) 59 (!) 56  Resp:  18 19 17   Temp:  98.4 F (36.9 C)    TempSrc:  Oral    SpO2:  93% 97% 100%  Weight: 72.6 kg     Height: 5\' 3"  (1.6 m)      Constitutional: NAD, calm, comfortable Respiratory: clear to auscultation bilaterally, no wheezing, no crackles. Normal respiratory effort. No accessory muscle use.  There is some splinting with breathing Cardiovascular: Regular rate and rhythm, no murmurs / rubs / gallops. No extremity edema. 2+ pedal pulses.  Abdomen: Focal tenderness right upper quadrant without guarding or rebounding, no masses palpated. No hepatosplenomegaly. Bowel sounds hypoactive and abdomen distended. Musculoskeletal: no clubbing / cyanosis. No joint deformity upper and lower extremities. Good ROM, no contractures. Normal muscle tone.  Skin: no rashes, lesions, ulcers. No induration Neurologic: CN 2-12 grossly intact. Sensation  intact,  Strength 5/5 x all 4 extremities.  Psychiatric: Normal judgment and insight. Alert and oriented x 3. Normal mood.    Data Reviewed:  As per HPI  Assessment and Plan: Postoperative abdominal pain after laparoscopic cholecystectomy with gallbladder fossa fluid collection Appreciate surgical input-HIDA scan pending-CT scan concerning for possible bile leak or other abnormal fluid collection in gallbladder fossa Will need nonnarcotic pain management until after HIDA scan.  Will also remain n.p.o. Continue IV fluids Will use Toradol for pain along with Protonix IV every 12 hours and continue IV Zofran for nausea  Mild acute hypoxemia with abnormal finding CT abdomen pelvis/known COPD Possibly related to ongoing issues with abdominal distention as well as splinting from abdominal pain Continue supportive care with oxygen, improve pain control, improve mobility, and add flutter valve or incentive spirometry Chest x-ray without evidence of infiltrate but does demonstrate subsegmental atelectasis Add DuoNebs as needed; present Trelegy or formulary substitution  Constipation Patient reports no bowel movement since surgery Likely combination of poor oral intake, decreased mobility and excessive narcotic use due to ongoing severe abdominal pain postoperatively After HIDA we will need to begin a bowel regimen with laxatives and stool softeners  Hyperglycemia Patient states she may or may not have a history of diabetes she has been unclear about this Check hemoglobin A1c and if concerning for diabetes mellitus we will need to add medication and have diabetes educator consult with patient  Hypertension Poorly controlled in context of ongoing pain Add as needed Apresoline Resume Catapres and lisinopril  Hypothyroidism Resume Synthroid  Dyslipidemia Can resume pravastatin after HIDA  Right breast mass Incidental finding during previous hospitalization Will need mammogram outpatient  after discharge  Longstanding anxiety disorder Continue Effexor (formulary substitution for preadmission Pristiq), Depakote and Rozerem    Advance Care Planning:   Code Status: Full Code   VTE prophylaxis: Subcutaneous heparin  Consults: Surgery  Family Communication: Patient only  Severity of Illness: The appropriate patient status for this patient is INPATIENT. Inpatient status is judged to be reasonable and necessary in order to provide the required intensity of service to ensure the patient's safety. The patient's presenting symptoms, physical exam findings, and initial radiographic and laboratory data in the context of their chronic comorbidities is felt to place them  at high risk for further clinical deterioration. Furthermore, it is not anticipated that the patient will be medically stable for discharge from the hospital within 2 midnights of admission.   * I certify that at the point of admission it is my clinical judgment that the patient will require inpatient hospital care spanning beyond 2 midnights from the point of admission due to high intensity of service, high risk for further deterioration and high frequency of surveillance required.*  Author: Junious Silk, NP 10/27/2022 6:38 AM  For on call review [email or web address].

## 2022-10-27 NOTE — Progress Notes (Addendum)
Patient HIDA scan was positive for biliary leak.  [facility] unable to accommodate ERCP for stent placement.  Dr. Benjamine Mola did speak with Dr. Orvan Falconer at [facility], GI who has agreed to see the patient upon arrival to [facility].  If patient only able to obtain a bed at [facility] long then [facility] will need to be notified in regards to unassigned GI patient.  Please refer to H&P for details.  Patient underwent laparoscopic cholecystectomy on 4/15 and has had persistent pain since that time.  Presented to ED overnight at [facility] where she was found to have an abnormal CT and HIDA scan confirmed biliary leak.  She has been started on IV Unasyn at recommendation of the surgical team. IR at [facility] will perform CT guided perc drain placement

## 2022-10-27 NOTE — Consult Note (Signed)
Chief Complaint: Patient was seen in consultation today for abdominal bile leak s/p laparoscopic cholecystectomy 10/19/22  Referring Physician(s): Algis Greenhouse, MD  Supervising Physician: Gilmer Mor  Patient Status: [facility], to transfer to [facility]  History of Present Illness: Kristin [name] is a 67 y.o. female with PMH significant for COPD, hypertension, hypothyroidism, and anxiety with recent surgical history of laparoscopic cholecystectomy for acute cholecystitis on 10/19/22. Patient presented to [facility] ED on 4/22 for abdominal pain and "not feeling herself" since her surgery. Patient underwent CT Abdomen/Pelvis w contrast on 4/22 which revealed perihepatic fluid collection with concern for possible bile leak. HIDA scan performed 4/23 confirmed the presence of a bile leak. IR was subsequently consulted for possible image-guided drain placement.  Past Medical History:  Diagnosis Date   Anxiety    GERD (gastroesophageal reflux disease)    History of kidney stones    Hypertension    Hypothyroidism    Thyroid disease     Past Surgical History:  Procedure Laterality Date   CARPAL TUNNEL RELEASE Left 10/28/2016   Procedure: CARPAL TUNNEL RELEASE;  Surgeon: Vickki Hearing, MD;  Location: [facility];  Service: Orthopedics;  Laterality: Left;   CESAREAN SECTION     CHOLECYSTECTOMY N/A 10/19/2022   Procedure: LAPAROSCOPIC CHOLECYSTECTOMY WITH INTRAOPERATIVE CHOLANGIOGRAM;  Surgeon: Lucretia Roers, MD;  Location: [facility];  Service: General;  Laterality: N/A;   ELBOW SURGERY     right   FINGER SURGERY     left pinky finger   HEMATOMA EVACUATION     left hip   KNEE SURGERY     right   LIVER BIOPSY  10/19/2022   Procedure: LIVER BIOPSY;  Surgeon: Lucretia Roers, MD;  Location: [facility];  Service: General;;   TONSILLECTOMY      Allergies: Patient has no known allergies.  Medications: Prior to Admission medications   Medication Sig Start Date  End Date Taking? Authorizing Provider  albuterol (VENTOLIN HFA) 108 (90 Base) MCG/ACT inhaler Inhale 2 puffs into the lungs every 8 (eight) hours as needed for wheezing or shortness of breath. 03/19/22   [provider]  alendronate (FOSAMAX) 70 MG tablet Take 70 mg by mouth every Monday. 08/07/22   [provider]  calcium carbonate (TUMS EX) 750 MG chewable tablet Chew 1 tablet by mouth 3 (three) times daily as needed for heartburn (and/or indigestion).    [provider]  cloNIDine (CATAPRES) 0.1 MG tablet Take 0.1 mg by mouth 2 (two) times daily. 07/06/22   [provider]  cyanocobalamin (VITAMIN B12) 1000 MCG tablet Take 3,000 mcg by mouth daily.    [provider]  desvenlafaxine (PRISTIQ) 50 MG 24 hr tablet Take 50 mg by mouth daily. 04/16/22   [provider]  divalproex (DEPAKOTE SPRINKLE) 125 MG capsule Take 125 mg by mouth 2 (two) times daily.    [provider]  gabapentin (NEURONTIN) 600 MG tablet Take 600 mg by mouth 3 (three) times daily. 07/09/22   [provider]  guaiFENesin (ROBITUSSIN) 100 MG/5ML liquid Take 5 mLs by mouth every 8 (eight) hours as needed for cough or to loosen phlegm.    [provider]  ibuprofen (ADVIL) 800 MG tablet Take 1 tablet (800 mg total) by mouth 3 (three) times daily. Patient taking differently: Take 400 mg by mouth every 6 (six) hours as needed for mild pain or moderate pain. 08/20/22   Particia Nearing, PA-C  levothyroxine (SYNTHROID) [phone]  MCG tablet Take 88 mcg by mouth daily before breakfast.    [provider]  Liniments (V-R PAIN RELIEVING) GEL Apply 1 Application topically 2 (two) times daily after a meal. Applied to lower back as directed    [provider]  lisinopril (PRINIVIL,ZESTRIL) 20 MG tablet Take 20 mg by mouth daily.    [provider]  nystatin (MYCOSTATIN) 100000 UNIT/ML suspension Take 5 mLs by mouth 4 (four) times daily. 10 DAY  COURSE STARTING ON 10/12/22 AND TO BE COMPLETED ON 10/22/2022    [provider]  ondansetron (ZOFRAN) 4 MG tablet Take 1 tablet (4 mg total) by mouth every 6 (six) hours as needed for nausea. 10/20/22   Lucretia Roers, MD  oxyCODONE (OXY IR/ROXICODONE) 5 MG immediate release tablet Take 1 tablet (5 mg total) by mouth every 4 (four) hours as needed for severe pain or breakthrough pain. 10/20/22   Lucretia Roers, MD  pantoprazole (PROTONIX) 40 MG tablet Take 40 mg by mouth daily. 07/06/22   [provider]  pravastatin (PRAVACHOL) 40 MG tablet Take 40 mg by mouth daily. 07/06/22   [provider]  psyllium (REGULOID) 0.52 g capsule Take 0.52 g by mouth at bedtime.    [provider]  ramelteon (ROZEREM) 8 MG tablet Take 8 mg by mouth at bedtime.    [provider]  tiZANidine (ZANAFLEX) 4 MG tablet Take 4 mg by mouth every 8 (eight) hours as needed for muscle spasms.    [provider]  Dwyane Luo 200-62.5-25 MCG/ACT AEPB Inhale 1 puff into the lungs daily. *RINSE MOUTH WITH WATER AND SPIT DAILY 08/06/22   [provider]  zolpidem (AMBIEN CR) 12.5 MG CR tablet Take 12.5 mg by mouth at bedtime.    [provider]     Family History  Problem Relation Age of Onset   Diabetes Mother    COPD Mother    Heart attack Mother    Diabetes Father    Emphysema Father    Heart attack Father    Kidney disease Maternal Aunt    Osteoporosis Maternal Aunt     Social History   Socioeconomic History   Marital status: Widowed    Spouse name: Not on file   Number of children: Not on file   Years of education: Not on file   Highest education level: Not on file  Occupational History   Not on file  Tobacco Use   Smoking status: Every Day    Packs/day: 0.25    Years: 30.00    Additional pack years: 0.00    Total pack years: 7.50    Types: Cigarettes   Smokeless tobacco: Never  Substance and Sexual Activity   Alcohol use: Yes     Alcohol/week: 6.0 standard drinks of alcohol    Types: 6 Cans of beer per week   Drug use: No   Sexual activity: Yes    Birth control/protection: Post-menopausal  Other Topics Concern   Not on file  Social History Narrative   Not on file   Social Determinants of Health   Financial Resource Strain: Not on file  Food Insecurity: No Food Insecurity (10/27/2022)   Hunger Vital Sign    Worried About Running Out of Food in the Last Year: Never true    Ran Out of Food in the Last Year: Never true  Transportation Needs: No Transportation Needs (10/27/2022)   PRAPARE - Administrator, Civil Service (Medical):  No    Lack of Transportation (Non-Medical): No  Physical Activity: Not on file  Stress: Not on file  Social Connections: Not on file    Code Status: Full Code  Review of Systems: A 12 point ROS discussed and pertinent positives are indicated in the HPI above.  All other systems are negative.  Review of Systems  Constitutional:  Positive for fever. Negative for chills.  Respiratory:  Negative for chest tightness and shortness of breath.   Cardiovascular:  Negative for chest pain and leg swelling.  Gastrointestinal:  Positive for abdominal pain and constipation. Negative for diarrhea, nausea and vomiting.  Neurological:  Positive for dizziness and headaches.  Psychiatric/Behavioral:  Negative for confusion.     Vital Signs: BP (!) 159/71 (BP Location: Left Arm)   Pulse 83   Temp 99.3 F (37.4 C) (Oral)   Resp 20   Ht  (1.6 m)   Wt 157 lb 3 oz (71.3 kg)   SpO2 93%   BMI 27.84 kg/m    Physical Exam Vitals reviewed.  Constitutional:      General: She is not in acute distress.    Appearance: She is ill-appearing.  HENT:     Mouth/Throat:     Mouth: Mucous membranes are moist.  Cardiovascular:     Rate and Rhythm: Normal rate and regular rhythm.     Pulses: Normal pulses.     Heart sounds: Normal heart sounds.  Pulmonary:     Effort: Pulmonary  effort is normal.     Breath sounds: Normal breath sounds.  Abdominal:     Tenderness: There is abdominal tenderness. There is no guarding or rebound.  Musculoskeletal:     Right lower leg: No edema.     Left lower leg: No edema.  Skin:    General: Skin is warm and dry.  Neurological:     Mental Status: She is alert and oriented to person, place, and time.  Psychiatric:        Mood and Affect: Mood normal.        Behavior: Behavior normal.        Thought Content: Thought content normal.        Judgment: Judgment normal.     Imaging: NM HEPATOBILIARY LEAK (POST-SURGICAL)  Result Date: 10/27/2022 CLINICAL DATA:  Postoperative abdominal pain, perihepatic fluid collection by CT question bile leak EXAM: NUCLEAR MEDICINE HEPATOBILIARY IMAGING TECHNIQUE: Sequential images of the abdomen were obtained out to 60 minutes following intravenous administration of radiopharmaceutical. RADIOPHARMACEUTICALS:  5.5 mCi Tc-38m  Choletec IV COMPARISON:  CT abdomen and pelvis 10/27/2022 FINDINGS: Normal tracer extraction from bloodstream indicating normal hepatocellular function. Prompt excretion of tracer into biliary tree. Small bowel visualized at 14 minutes. Gallbladder surgically absent. Beginning at 11 minutes, abnormal tracer accumulates at the lateral margin of the liver corresponding to fluid collection on CT consistent with bile leak. IMPRESSION: Post cholecystectomy. Patent biliary tree with tracer into small bowel. Abnormal localization of tracer within fluid collection lateral to the RIGHT lobe of the liver consistent with bile leak. Findings called to Dr. Lovell Sheehan on 10/27/2022 at 1130 hours. Electronically Signed   By: Ulyses Southward M.D.   On: 10/27/2022 11:44   DG Chest Port 1 View  Result Date: 10/27/2022 CLINICAL DATA:  Shortness of breath, vomiting, fever, recent cholecystectomy EXAM: PORTABLE CHEST 1 VIEW COMPARISON:  10/17/2022 FINDINGS: Cardiac size is within normal limits. There are no signs  of alveolar pulmonary edema. There is interval appearance of  small right pleural effusion. Transverse linear density in right parahilar region may suggest subsegmental atelectasis. There is no pneumothorax. Degenerative changes are noted in left shoulder. IMPRESSION: There are no signs of alveolar pulmonary edema or focal pulmonary consolidation. Small right pleural effusion. Linear density in right parahilar region may suggest subsegmental atelectasis. Electronically Signed   By: Ernie Avena M.D.   On: 10/27/2022 09:24   CT ABDOMEN PELVIS W CONTRAST  Result Date: 10/27/2022 CLINICAL DATA:  Postoperative abdominal pain. EXAM: CT ABDOMEN AND PELVIS WITH CONTRAST TECHNIQUE: Multidetector CT imaging of the abdomen and pelvis was performed using the standard protocol following bolus administration of intravenous contrast. RADIATION DOSE REDUCTION: This exam was performed according to the departmental dose-optimization program which includes automated exposure control, adjustment of the mA and/or kV according to patient size and/or use of iterative reconstruction technique. CONTRAST:  OMNIPAQUE IOHEXOL 300 MG/ML  SOLN COMPARISON:  October 17, 2022 FINDINGS: Lower chest: Mild to moderate severity areas of atelectasis are seen within the bilateral lung bases, right greater than left. A small right pleural effusion is noted. Hepatobiliary: A stable subcentimeter cyst is seen within the anterior aspect of the left lobe of the liver. Status post cholecystectomy. A moderate to marked amount of fluid and a small amount of air is seen within the gallbladder fossa. This extends along the anterior and lateral aspect of the right lobe of the liver. A 7.3 cm x 6.2 cm x 4.5 cm well-defined collection of fluid is also seen inferior to the right lobe of the liver (axial CT images 30 through 41, CT series 2). This a appears to connect to the gallbladder fossa (best seen on coronal reformatted images 37 through 40, CT  series 6). No postoperative abscesses are identified. The common bile duct measures 12 mm in diameter. Pancreas: Unremarkable. No pancreatic ductal dilatation or surrounding inflammatory changes. Spleen: Normal in size without focal abnormality. Adrenals/Urinary Tract: Adrenal glands are unremarkable. Kidneys are normal, without renal calculi, focal lesion, or hydronephrosis. Bladder is unremarkable. Stomach/Bowel: Stomach is within normal limits. Appendix appears normal. No evidence of bowel wall thickening, distention, or inflammatory changes. Noninflamed diverticula are seen throughout the proximal to mid sigmoid colon. Vascular/Lymphatic: Aortic atherosclerosis. No enlarged abdominal or pelvic lymph nodes. Reproductive: Uterus and bilateral adnexa are unremarkable. Other: No abdominal wall hernia or abnormality. No abdominopelvic ascites. Musculoskeletal: Multilevel degenerative changes seen throughout the lumbar spine. IMPRESSION: 1. Status post cholecystectomy with a marked amount of postoperative perihepatic and right upper quadrant fluid, as described above. 2. Sigmoid diverticulosis. 3. Mild to moderate severity bilateral basilar atelectasis, right greater than left. 4. Small right pleural effusion. 5. Aortic atherosclerosis. Aortic Atherosclerosis (ICD10-I70.0). Electronically Signed   By: Aram Candela M.D.   On: 10/27/2022 00:23   DG Cholangiogram Operative  Result Date: 10/19/2022 CLINICAL DATA:  Intraoperative cholangiogram during laparoscopic cholecystectomy. EXAM: INTRAOPERATIVE CHOLANGIOGRAM FLUOROSCOPY TIME:  1 minute, 5 seconds COMPARISON:  Right upper quadrant abdominal ultrasound-10/17/2022; CT abdomen pelvis-10/17/2022 FINDINGS: Intraoperative cholangiographic images of the right upper abdominal quadrant during laparoscopic cholecystectomy are provided for review. Surgical clips overlie the expected location of the gallbladder fossa. Contrast injection demonstrates selective cannulation  of the central aspect of the cystic duct. There is passage of contrast through the central aspect of the cystic duct with filling of a mildly dilated common bile duct. There is passage of contrast though the CBD and into the descending portion of the duodenum. There is minimal reflux of injected contrast into the common  hepatic duct and central aspect of the non dilated intrahepatic biliary system. There are no discrete filling defects within the opacified portions of the biliary system to suggest the presence of choledocholithiasis. IMPRESSION: No evidence of choledocholithiasis. Electronically Signed   By: Simonne Come M.D.   On: 10/19/2022 15:22   US Abdomen Limited RUQ (LIVER/GB)  Result Date: 10/17/2022 CLINICAL DATA:  Acute right upper quadrant abdominal pain. EXAM: ULTRASOUND ABDOMEN LIMITED RIGHT UPPER QUADRANT COMPARISON:  CT scan of same day. FINDINGS: Gallbladder: Multiple gallstones are noted, with 1 noted in the neck of the gallbladder. Severe gallbladder wall thickening is noted measuring 12 mm at 1 point. No sonographic Murphy's sign is noted. Common bile duct: Diameter: 8 mm which is mildly dilated. Liver: No focal lesion identified. Within normal limits in parenchymal echogenicity. Portal vein is patent on color Doppler imaging with normal direction of blood flow towards the liver. Other: None. IMPRESSION: Multiple gallstones are noted with at least 1 calculus noted in the neck of the gallbladder. Severe gallbladder wall thickening is noted. These findings are concerning for possible cholecystitis. Mild common bile duct dilatation is noted as described on CT scan of same day. Electronically Signed   By: Lupita Raider M.D.   On: 10/17/2022 12:49   CT ABDOMEN PELVIS W CONTRAST  Result Date: 10/17/2022 CLINICAL DATA:  Acute abdominal pain.  Worsening abdominal pain. EXAM: CT ABDOMEN AND PELVIS WITH CONTRAST TECHNIQUE: Multidetector CT imaging of the abdomen and pelvis was performed using the  standard protocol following bolus administration of intravenous contrast. RADIATION DOSE REDUCTION: This exam was performed according to the departmental dose-optimization program which includes automated exposure control, adjustment of the mA and/or kV according to patient size and/or use of iterative reconstruction technique. CONTRAST:  OMNIPAQUE IOHEXOL 300 MG/ML  SOLN COMPARISON:  None Available. FINDINGS: Lower chest: No acute abnormality. Questionable mass within the lower RIGHT breast, measuring approximately 1 cm greatest dimension (series 2, image 16). Hepatobiliary: No acute or suspicious findings within the liver. Sludge and/or stones are present within the nondistended gallbladder. Diffuse enhancement of the gallbladder walls. No pericholecystic fluid. Common bile duct measures approximately 1 cm diameter. No stone is identified within the common bile duct. Pancreas: Unremarkable. No pancreatic ductal dilatation or surrounding inflammatory changes. Spleen: Normal in size without focal abnormality. Adrenals/Urinary Tract: Adrenal glands appear normal. Kidneys are unremarkable without suspicious mass, stone or hydronephrosis. No ureteral or bladder calculi are identified. Bladder walls are circumferentially thickened, possibly accentuated to some degree by incomplete bladder distention. Stomach/Bowel: No dilated large or small bowel loops. No evidence of bowel wall inflammation. Appendix appears normal. Scattered diverticulosis of the sigmoid colon but no focal inflammatory changes seen to suggest acute diverticulitis. Stomach is unremarkable, although partially decompressed limiting characterization. Vascular/Lymphatic: Aortic atherosclerosis. No acute-appearing vascular abnormality. Mildly prominent lymph nodes within the porta hepatis and portacaval spaces. No enlarged lymph nodes identified elsewhere in the abdomen or pelvis. Reproductive: Uterus and bilateral adnexa are unremarkable. Other: No  free fluid or abscess collection is seen. No free intraperitoneal air. Musculoskeletal: Degenerative spondylosis of the slightly scoliotic thoracolumbar spine. No acute-appearing osseous abnormality. IMPRESSION: 1. Sludge and/or stones within the nondistended gallbladder. Diffuse enhancement of the gallbladder walls, but no pericholecystic fluid. Recommend right upper quadrant ultrasound to evaluate for acute cholecystitis. 2. Common bile duct measures approximately 1 cm diameter. No stone is identified within the common bile duct. Recommend correlation with LFTs. If LFTs are elevated, would recommend MRCP for further characterization.  This CBD dilatation can also be further characterized on the RIGHT upper quadrant ultrasound recommended above. 3. Bladder walls are circumferentially thickened, possibly accentuated to some degree by incomplete bladder distention. Recommend correlation with urinalysis to exclude cystitis. 4. Colonic diverticulosis without evidence of acute diverticulitis. 5. Questionable mass within the lower RIGHT breast, measuring approximately 1 cm greatest dimension. Recommend nonemergent diagnostic mammogram at a Breast Center for further characterization. 6. Mildly prominent lymph nodes within the porta hepatis and portacaval spaces, most likely reactive in nature. Aortic Atherosclerosis (ICD10-I70.0). Electronically Signed   By: Bary Richard M.D.   On: 10/17/2022 10:07   DG Abdomen Acute W/Chest  Result Date: 10/17/2022 CLINICAL DATA:  Acute abdominal pain. EXAM: DG ABDOMEN ACUTE WITH 1 VIEW CHEST COMPARISON:  None Available. FINDINGS: There is no evidence of dilated bowel loops or free intraperitoneal air. No radiopaque calculi or other significant radiographic abnormality is seen. Heart size and mediastinal contours are within normal limits. Both lungs are clear. IMPRESSION: No abnormal bowel dilatation.  No acute cardiopulmonary disease. Electronically Signed   By: Lupita Raider M.D.    On: 10/17/2022 09:12    Labs:  CBC: Recent Labs    10/18/22 0437 10/19/22 0558 10/20/22 0438 10/26/22 2020  WBC 7.6 4.8 8.2 6.3  HGB 11.5* 10.5* 10.0* 9.4*  HCT 35.4* 32.6* 31.6* 28.3*  PLT 257 216 224 243    COAGS: Recent Labs    10/18/22 0437  INR 1.2    BMP: Recent Labs    10/18/22 0437 10/19/22 0558 10/20/22 0438 10/26/22 2020  NA 139 139 135 134*  K 3.7 3.4* 4.6 3.5  CL 103 107 106 101  CO2 GLUCOSE 118* 136* 167* 229*  BUN 7*  CALCIUM 8.2* 7.8* 7.6* 7.9*  CREATININE 0.91 0.75 0.94 0.94  GFRNONAA >60 >60 >60 >60    LIVER FUNCTION TESTS: Recent Labs    10/18/22 0437 10/19/22 0558 10/20/22 0438 10/26/22 2020  BILITOT 0.9 0.3 0.4 0.4  AST 79* 24 49* 15  ALT 135* 80* 80* 20  ALKPHOS 107 91 94 98  PROT 6.1* 5.6* 5.8* 6.1*  ALBUMIN 3.3* 2.9* 3.0* 2.7*    TUMOR MARKERS: No results for input(s): "AFPTM", "CEA", "CA199", "CHROMGRNA" in the last 8760 hours.  Assessment and Plan:  Kristin [name] is a 68 yo female being seen today in relation to an abdominal bile leak s/p laparoscopic cholecystectomy 10/19/22. Bile leak was confirmed with HIDA scan performed 10/27/22. Patient is to be transferred to [facility] on 10/27/22 for consultation with GI for possible ERCP procedure. IR has been consulted to perform image-guided abdominal drain placement while patient is at [facility]. Case has been reviewed and approved by Dr Loreta Ave. Patient is NPO and heparin has been held. Hgb is 9.4 and WBC is 6.3.  Risks and benefits discussed with the patient including bleeding, infection, damage to adjacent structures, bowel perforation/fistula connection, and sepsis.  All of the patient's questions were answered, patient is agreeable to proceed. Consent signed and in chart.   Thank you for this interesting consult.  I greatly enjoyed meeting SUELLA COGAR and look forward to participating in their care.  A copy of this report was sent to the  requesting provider on this date.  Electronically Signed: Kennieth Francois, PA-C 10/27/2022, 12:21 PM   I spent a total of 20 Minutes  in face to face in clinical consultation, greater than 50% of which was  counseling/coordinating care for abdominal bile leak s/p laparoscopic cholecystectomy 10/19/22.

## 2022-10-27 NOTE — Progress Notes (Signed)
Amber from [employer] for an update. Brief update provided. Kellogg RN

## 2022-10-28 ENCOUNTER — Inpatient Hospital Stay (HOSPITAL_COMMUNITY): Payer: 59

## 2022-10-28 DIAGNOSIS — R1011 Right upper quadrant pain: Secondary | ICD-10-CM | POA: Diagnosis not present

## 2022-10-28 DIAGNOSIS — T888XXA Other specified complications of surgical and medical care, not elsewhere classified, initial encounter: Secondary | ICD-10-CM | POA: Diagnosis present

## 2022-10-28 DIAGNOSIS — E119 Type 2 diabetes mellitus without complications: Secondary | ICD-10-CM

## 2022-10-28 DIAGNOSIS — F419 Anxiety disorder, unspecified: Secondary | ICD-10-CM | POA: Insufficient documentation

## 2022-10-28 LAB — CBC
HCT: 30.8 % — ABNORMAL LOW (ref 36.0–46.0)
Hemoglobin: 10.4 g/dL — ABNORMAL LOW (ref 12.0–15.0)
MCH: 31.6 pg (ref 26.0–34.0)
MCHC: 33.8 g/dL (ref 30.0–36.0)
MCV: 93.6 fL (ref 80.0–100.0)
Platelets: 298 10*3/uL (ref 150–400)
RBC: 3.29 MIL/uL — ABNORMAL LOW (ref 3.87–5.11)
RDW: 13.1 % (ref 11.5–15.5)
WBC: 6.4 10*3/uL (ref 4.0–10.5)
nRBC: 0 % (ref 0.0–0.2)

## 2022-10-28 LAB — COMPREHENSIVE METABOLIC PANEL
ALT: 19 U/L (ref 0–44)
AST: 12 U/L — ABNORMAL LOW (ref 15–41)
Albumin: 2.3 g/dL — ABNORMAL LOW (ref 3.5–5.0)
Alkaline Phosphatase: 108 U/L (ref 38–126)
Anion gap: 12 (ref 5–15)
BUN: 5 mg/dL — ABNORMAL LOW (ref 8–23)
CO2: 27 mmol/L (ref 22–32)
Calcium: 8.2 mg/dL — ABNORMAL LOW (ref 8.9–10.3)
Chloride: 100 mmol/L (ref 98–111)
Creatinine, Ser: 0.91 mg/dL (ref 0.44–1.00)
GFR, Estimated: >60 mL/min (ref >60)
Glucose, Bld: 125 mg/dL — ABNORMAL HIGH (ref 70–99)
Potassium: 4.4 mmol/L (ref 3.5–5.1)
Sodium: 139 mmol/L (ref 135–145)
Total Bilirubin: 0.5 mg/dL (ref 0.3–1.2)
Total Protein: 5.7 g/dL — ABNORMAL LOW (ref 6.5–8.1)

## 2022-10-28 LAB — AEROBIC/ANAEROBIC CULTURE W GRAM STAIN (SURGICAL/DEEP WOUND)

## 2022-10-28 LAB — GLUCOSE, CAPILLARY
Glucose-Capillary: 233 mg/dL — ABNORMAL HIGH (ref 70–99)
Glucose-Capillary: 78 mg/dL (ref 70–99)

## 2022-10-28 MED ORDER — INSULIN ASPART 100 UNIT/ML IJ SOLN: 0.0000 [IU] | Freq: Every day | INTRAMUSCULAR | Status: DC

## 2022-10-28 MED ORDER — FENTANYL CITRATE (PF) 100 MCG/2ML IJ SOLN
INTRAMUSCULAR | Status: AC
  Filled 2022-10-28: qty 2

## 2022-10-28 MED ORDER — SODIUM CHLORIDE 0.9% FLUSH
5.0000 mL | Freq: Three times a day (TID) | INTRAVENOUS | Status: DC
  Administered 2022-10-28 – 2022-11-02 (×13): 5 mL

## 2022-10-28 MED ORDER — INSULIN ASPART 100 UNIT/ML IJ SOLN
0.0000 [IU] | Freq: Three times a day (TID) | INTRAMUSCULAR | Status: DC
  Administered 2022-10-28: 3 [IU] via SUBCUTANEOUS
  Administered 2022-10-29 – 2022-10-30 (×2): 2 [IU] via SUBCUTANEOUS

## 2022-10-28 MED ORDER — PANTOPRAZOLE SODIUM 40 MG PO TBEC
40.0000 mg | DELAYED_RELEASE_TABLET | Freq: Every day | ORAL | Status: DC
  Administered 2022-10-28 – 2022-11-02 (×6): 40 mg via ORAL
  Filled 2022-10-28 (×6): qty 1

## 2022-10-28 MED ORDER — FENTANYL CITRATE (PF) 100 MCG/2ML IJ SOLN
INTRAMUSCULAR | Status: AC | PRN
  Administered 2022-10-28 (×2): 25 ug via INTRAVENOUS
  Administered 2022-10-28: 50 ug via INTRAVENOUS

## 2022-10-28 MED ORDER — MIDAZOLAM HCL 2 MG/2ML IJ SOLN
INTRAMUSCULAR | Status: AC
  Filled 2022-10-28: qty 2

## 2022-10-28 MED ORDER — MIDAZOLAM HCL 2 MG/2ML IJ SOLN
INTRAMUSCULAR | Status: AC | PRN
  Administered 2022-10-28: 1 mg via INTRAVENOUS
  Administered 2022-10-28: .5 mg via INTRAVENOUS

## 2022-10-28 NOTE — Progress Notes (Signed)
[facility] at bedside. Reilynn Lauro Paula Busenbark,RN 

## 2022-10-28 NOTE — Assessment & Plan Note (Signed)
Continue with bowel regimen

## 2022-10-28 NOTE — Assessment & Plan Note (Signed)
Continue Synthroid °

## 2022-10-28 NOTE — Procedures (Signed)
Interventional Radiology Procedure Note  Procedure: Image guided drain placement, RUQ/biloma.  85F pigtail drain.  Complications: None  EBL: None Sample: Culture sent  Recommendations: - Routine drain care, with sterile flushes, record output - follow up Cx - routine wound care - OK for advancing diet, per primary order according to other surgical plans  Signed,  Yvone Neu. Loreta Ave, DO

## 2022-10-28 NOTE — Progress Notes (Signed)
  Progress Note   Patient: Kristin [name]:[id number] DOB: 05/01/1956 DOA: 10/26/2022     1 DOS: the patient was seen and examined on 10/28/2022   Brief hospital course: No notes on file  Assessment and Plan: * Abdominal pain Biliary leak.  History of laparoscopic cholecystectomy 1 week ago. Found to have positive HIDA scan for biliary leak s/p drain placement by IR. -Follow-up cultures -Continue with Unasyn -Continue with pain management and supportive care  HTN (hypertension) -Continue home clonidine and lisinopril  Diabetes mellitus Patient was found to have hyperglycemia without prior diagnosis of diabetes.  A1c was checked and found to be elevated at 7.2 which makes her diabetic. -Start her on SSI -Might need p.o. meds on discharge with a close PCP follow-up  Constipation -Continue with bowel regimen  Hypothyroidism -Continue Synthroid  Breast mass, right-Needs mammogram Incidental finding during previous hospitalization Will need mammogram outpatient after discharge   Subjective: Patient was seen after drain placement today.  Denies any pain.  Wants to eat.  No bowel movement yet  Physical Exam: Vitals:   10/28/22 1040 10/28/22 1045 10/28/22 1050 10/28/22 1056  BP: (!) 161/76 (!) 144/66 (!) 186/99 (!) 165/73  Pulse: 64 80 71 70  Resp: Temp:      TempSrc:      SpO2: 95% 95% 96% 92%  Weight:      Height:       General.  Well-developed lady, in no acute distress. Pulmonary.  Lungs clear bilaterally, normal respiratory effort. CV.  Regular rate and rhythm, no JVD, rub or murmur. Abdomen.  Soft, nontender, nondistended, BS positive.  RUQ drain in place CNS.  Alert and oriented .  No focal neurologic deficit. Extremities.  No edema, no cyanosis, pulses intact and symmetrical. Psychiatry.  Judgment and insight appears normal.   Data Reviewed: Prior data reviewed  Family Communication: Discussed with patient  Disposition: Status is:  Inpatient Remains inpatient appropriate because: Severity of illness  Planned Discharge Destination: Home  Time spent: 40 minutes  This record has been created using Conservation officer, historic buildings. Errors have been sought and corrected,but may not always be located. Such creation errors do not reflect on the standard of care.   Author: Arnetha Courser, MD 10/28/2022 4:03 PM  For on call review [email or web address].

## 2022-10-28 NOTE — Assessment & Plan Note (Signed)
Patient was found to have hyperglycemia without prior diagnosis of diabetes.  A1c was checked and found to be elevated at 7.2 which makes her diabetic. -Start her on SSI -Might need p.o. meds on discharge with a close PCP follow-up 

## 2022-10-28 NOTE — Assessment & Plan Note (Signed)
Biliary leak.  History of laparoscopic cholecystectomy 1 week ago. Found to have positive HIDA scan for biliary leak s/p drain placement by IR. -Follow-up cultures -Continue with Unasyn -Continue with pain management and supportive care

## 2022-10-28 NOTE — Hospital Course (Addendum)
67 y.o. female with medical history significant of COPD, hypertension, hypothyroidism, dyslipidemia, anxiety and recent laparoscopic cholecystectomy 1 week ago presented to [facility], ED with worsening abdominal pain.  In the ED patient was hemodynamically stable.  CT scan of the abdomen and pelvis showed atelectasis within the bilateral lung bases with a small right pleural effusion. She was also found to have a fluid collection in the gallbladder fossa measuring 7.3 x 6.2 x 4.5 cm this area is well-defined. Common bile duct 12 mm.  General surgery was consulted and for concern of bile leak versus hematoma, HIDA scan was ordered.  HIDA scan was positive for biliary leak and patient was transferred to [facility] for IR evaluation and drain placement.    Biliary cultures have been negative, biliary leak status post drain placement by IR underwent ERCP with stent placement 4/26 Patient has been medically stable awaiting for assisted living facility 4/29 Overnight afebrile BP stable Labs this morning with normal LFTs and bilirubin and electrolytes stable hemoglobin 10.6 g with normal WBC. She is doing well and was waiting for LFT acceptable and has been accepted

## 2022-10-28 NOTE — Assessment & Plan Note (Signed)
Incidental finding during previous hospitalization Will need mammogram outpatient after discharge

## 2022-10-28 NOTE — Assessment & Plan Note (Signed)
-  Continue home clonidine and lisinopril

## 2022-10-28 NOTE — Progress Notes (Signed)
Pt arrived to the unit with no signs of distress

## 2022-10-29 DIAGNOSIS — R1011 Right upper quadrant pain: Secondary | ICD-10-CM | POA: Diagnosis not present

## 2022-10-29 DIAGNOSIS — K838 Other specified diseases of biliary tract: Secondary | ICD-10-CM

## 2022-10-29 DIAGNOSIS — K5909 Other constipation: Secondary | ICD-10-CM

## 2022-10-29 DIAGNOSIS — D649 Anemia, unspecified: Secondary | ICD-10-CM

## 2022-10-29 DIAGNOSIS — T888XXA Other specified complications of surgical and medical care, not elsewhere classified, initial encounter: Secondary | ICD-10-CM | POA: Diagnosis not present

## 2022-10-29 DIAGNOSIS — K9189 Other postprocedural complications and disorders of digestive system: Secondary | ICD-10-CM | POA: Diagnosis not present

## 2022-10-29 DIAGNOSIS — K219 Gastro-esophageal reflux disease without esophagitis: Secondary | ICD-10-CM

## 2022-10-29 LAB — GLUCOSE, CAPILLARY
Glucose-Capillary: 110 mg/dL — ABNORMAL HIGH (ref 70–99)
Glucose-Capillary: 165 mg/dL — ABNORMAL HIGH (ref 70–99)
Glucose-Capillary: 65 mg/dL — ABNORMAL LOW (ref 70–99)
Glucose-Capillary: 87 mg/dL (ref 70–99)
Glucose-Capillary: 187 mg/dL — ABNORMAL HIGH (ref 70–99)

## 2022-10-29 LAB — AEROBIC/ANAEROBIC CULTURE W GRAM STAIN (SURGICAL/DEEP WOUND): Gram Stain: NONE SEEN

## 2022-10-29 MED ORDER — LANCETS MISC. MISC: 1.0000 | Freq: Three times a day (TID) | 0 refills | Status: DC

## 2022-10-29 MED ORDER — AMOXICILLIN-POT CLAVULANATE 875-125 MG PO TABS: 1.0000 | ORAL_TABLET | Freq: Two times a day (BID) | ORAL | 0 refills | Status: DC

## 2022-10-29 MED ORDER — BLOOD GLUCOSE MONITORING SUPPL DEVI: 1.0000 | Freq: Three times a day (TID) | 0 refills | Status: DC

## 2022-10-29 MED ORDER — METFORMIN HCL ER 500 MG PO TB24: 500.0000 mg | ORAL_TABLET | Freq: Two times a day (BID) | ORAL | 0 refills | Status: DC

## 2022-10-29 MED ORDER — LANCET DEVICE MISC: 1.0000 | Freq: Three times a day (TID) | 0 refills | Status: DC

## 2022-10-29 MED ORDER — BLOOD GLUCOSE TEST VI STRP: 1.0000 | ORAL_STRIP | Freq: Three times a day (TID) | 0 refills | Status: DC

## 2022-10-29 MED ORDER — BISACODYL 5 MG PO TBEC
10.0000 mg | DELAYED_RELEASE_TABLET | Freq: Once | ORAL | Status: AC
  Administered 2022-10-29: 10 mg via ORAL
  Filled 2022-10-29: qty 2

## 2022-10-29 NOTE — Assessment & Plan Note (Signed)
Biliary leak.  History of laparoscopic cholecystectomy 1 week ago. Found to have positive HIDA scan for biliary leak s/p drain placement by IR. GI was consulted for ERCP -Going for ERCP tomorrow -Follow-up cultures-preliminary negative -Continue with Unasyn -Continue with pain management and supportive care

## 2022-10-29 NOTE — Assessment & Plan Note (Signed)
Patient was found to have hyperglycemia without prior diagnosis of diabetes.  A1c was checked and found to be elevated at 7.2 which makes her diabetic. -Start her on SSI -Might need p.o. meds on discharge with a close PCP follow-up

## 2022-10-29 NOTE — Progress Notes (Signed)
Progress Note   Patient: Kristin [name] ZOX:[id number] DOB: 04-18-1956 DOA: 10/26/2022     2 DOS: the patient was seen and examined on 10/29/2022   Brief hospital course: Taken from H&P.   Kristin [name] is a 67 y.o. female with medical history [name] of COPD, hypertension, hypothyroidism, dyslipidemia, anxiety and recent laparoscopic cholecystectomy 1 week ago presented to [facility], ED with worsening abdominal pain.  On presentation she was hemodynamically stable.CT abdomen and pelvis revealed mild to moderate severe areas of atelectasis within the bilateral lung bases with a small right pleural effusion. She was also found to have a fluid collection in the gallbladder fossa measuring 7.3 x 6.2 x 4.5 cm this area is well-defined. Common bile duct 12 mm.  General surgery was consulted and for concern of bile leak versus hematoma, HIDA scan was ordered.  HIDA scan was positive for leak.  Patient was transferred to [facility] s/p drain placement by IR.  4/24: Patient was feeling much improved after drain placement this morning.  Cultures pending.  4/25: Blood pressure elevated this morning which improved after taking home medications.  Preliminary drain cultures negative so far.  No more abdominal pain and patient had a small bowel movement.  She wants to go home.  Initially she was discharged on Augmentin and later received message by her surgeon that patient need ERCP.  GI was consulted and they are planning to do procedure tomorrow for bile leak.  If remains stable can be discharged tomorrow after ERCP.  Assessment and Plan: * Abdominal pain Biliary leak.  History of laparoscopic cholecystectomy 1 week ago. Found to have positive HIDA scan for biliary leak s/p drain placement by IR. GI was consulted for ERCP -Going for ERCP tomorrow -Follow-up cultures-preliminary negative -Continue with Unasyn -Continue with pain management and supportive care  HTN  (hypertension) -Continue home clonidine and lisinopril  Diabetes mellitus Patient was found to have hyperglycemia without prior diagnosis of diabetes.  A1c was checked and found to be elevated at 7.2 which makes her diabetic. -Start her on SSI -Might need p.o. meds on discharge with a close PCP follow-up  Constipation -Continue with bowel regimen  Hypothyroidism -Continue Synthroid  Breast mass, right-Needs mammogram Incidental finding during previous hospitalization Will need mammogram outpatient after discharge   Subjective: Patient was seen and examined today.  She was very eager to go home and requesting discharge.  No more abdominal pain.  Physical Exam: Vitals:   10/29/22 0540 10/29/22 0730 10/29/22 0736 10/29/22 0933  BP: (!) 173/68  (!) 180/77 (!) 181/62  Pulse: 61  70 68  Resp: 17     Temp: 98 F (36.7 C)  98.1 F (36.7 C)   TempSrc: Oral  Oral   SpO2: 96% 96% 97%   Weight:      Height:       General.  Well-developed lady, in no acute distress. Pulmonary.  Lungs clear bilaterally, normal respiratory effort. CV.  Regular rate and rhythm, no JVD, rub or murmur. Abdomen.  Soft, nontender, nondistended, BS positive.  RUQ drain in place CNS.  Alert and oriented .  No focal neurologic deficit. Extremities.  No edema, no cyanosis, pulses intact and symmetrical. Psychiatry.  Judgment and insight appears normal.   Data Reviewed: Prior data reviewed  Family Communication: Discussed with patient  Disposition: Status is: Inpatient Remains inpatient appropriate because: Severity of illness  Planned Discharge Destination: Home  Time spent: 42 minutes  This record has been created using  Dragon Chemical engineer. Errors have been sought and corrected,but may not always be located. Such creation errors do not reflect on the standard of care.   Author: Arnetha Courser, MD 10/29/2022 1:15 PM  For on call review [email or web address].

## 2022-10-29 NOTE — Progress Notes (Addendum)
[facility]  As I am at [facility] and do not round at [facility], I have been following patient's progress via Epic and she received IR drain yesterday.  On looking at chart today, she is being discharged without GI involvement.  With her HIDA and the leak, she needs ERCP for stent.   This was documented in my notes and the H&P from the hospitalist.   I have spoken with Dr. Nelson Chimes to make her aware. Appreciate everyone's assistance.   Algis Greenhouse, MD [facility] [address] [place], [place] [phone] (office)

## 2022-10-29 NOTE — Consult Note (Signed)
Consultation Note   Referring Provider:  [facility] PCP: Kristin Lunger, DO Primary Gastroenterologist: Kristin [name]        Reason for consultation: Bile duct leak  Hospital Day: 4   Assessment    67 year old female with pmh of GERD,  COPD, hypertension, hypothyroidism, dyslipidemia, anxiety, chronic constipation. Recent admission for cholecystitis, status post laparoscopic cholecystectomy with IOC.  Now admitted with bile leak, status post percutaneous drain by IR  Chronic constipation.   Takes MiraLAX as needed.  Last bowel movement was on 10/13/2018  Normocytic anemia, new since cholecystectomy.   Probably related to surgery. No overt GI bleeding.   Hemoglobin stable at 10.4  Chronic GERD Asymptomatic on unknown daily medication   Colon cancer screening -No history of screening.   Plan   -On Unasyn -Will need ERCP with stent placement. The benefits and risks of ERCP with possible sphincterotomy not limited to cardiopulmonary complications of sedation, bleeding, infection, perforation,and pancreatitis were discussed with the patient who agrees to proceed.  Procedure will most likely be done tomorrow.  -Will stop SQ heparin after MN -Last BM was on 4/9. Will give PO dulcolax and miralax -Outpatient screening colonoscopy  History of Present Illness   Kristin [name] had acute cholecysitis and underwent lap chole with IOC on 10/19/22 at [facility] hospital. Path returned as chronic cholecystitis.  During surgery it was noted that she had a nodular liver which was biopsied . she also had a nodular liver and I biopsied it.  Returned to [facility] two days ago with generalized right sided abdominal pain. HIDA was positive for bile leak. She was transferred to [facility] for IR to place biliary drain in also for GI consult for ERCP. Biliary drain placed yesterday.  Fluid studies negative so far.   Unrelated, she has chronic GERD Asymptomatic on daliy  medication but she doesn't know what it is an not on home med list. She also has chronic constipation. Last BM was 4/9. Takes Miralax as needed. Never had a colon cancer screening. Has never seen any blood in her stool. No FMH of colon cancer but sister had colon polyps.    Previous GI History / Evaluation :  none   Imaging and Labs    Recent Labs    10/26/22 2020 10/28/22 0421  WBC 6.3 6.4  HGB 9.4* 10.4*  HCT 28.3* 30.8*  PLT 243 298   Recent Labs    10/26/22 2020 10/28/22 0421  NA 134* 139  K 3.5 4.4  CL 101 100  CO2 24 27  GLUCOSE 229* 125*  BUN 7* 5*  CREATININE 0.94 0.91  CALCIUM 7.9* 8.2*   Recent Labs    10/28/22 0421  PROT 5.7*  ALBUMIN 2.3*  AST 12*  ALT 19  ALKPHOS 108  BILITOT 0.5   No results for input(s): "HEPBSAG", "HCVAB", "HEPAIGM", "HEPBIGM" in the last 72 hours. No results for input(s): "LABPROT", "INR" in the last 72 hours.  Past Medical History:  Diagnosis Date   Anxiety    GERD (gastroesophageal reflux disease)    History of kidney stones    Hypertension    Hypothyroidism    Thyroid disease     Past Surgical History:  Procedure Laterality Date   CARPAL  TUNNEL RELEASE Left 10/28/2016   Procedure: CARPAL TUNNEL RELEASE;  Surgeon: Vickki Hearing, MD;  Location: [facility];  Service: Orthopedics;  Laterality: Left;   CESAREAN SECTION     CHOLECYSTECTOMY N/A 10/19/2022   Procedure: LAPAROSCOPIC CHOLECYSTECTOMY WITH INTRAOPERATIVE CHOLANGIOGRAM;  Surgeon: Lucretia Roers, MD;  Location: [facility];  Service: General;  Laterality: N/A;   ELBOW SURGERY     right   FINGER SURGERY     left pinky finger   HEMATOMA EVACUATION     left hip   KNEE SURGERY     right   LIVER BIOPSY  10/19/2022   Procedure: LIVER BIOPSY;  Surgeon: Lucretia Roers, MD;  Location: [facility];  Service: General;;   TONSILLECTOMY      Family History  Problem Relation Age of Onset   Diabetes Mother    COPD Mother    Heart attack Mother    Diabetes Father     Emphysema Father    Heart attack Father    Kidney disease Maternal Aunt    Osteoporosis Maternal Aunt     Prior to Admission medications   Medication Sig Start Date End Date Taking? Authorizing Provider  amoxicillin (AMOXIL) 500 MG capsule Take by mouth. 09/01/22  Yes [provider]  amoxicillin-clavulanate (AUGMENTIN) 875-125 MG tablet Take 1 tablet by mouth 2 (two) times daily for 5 days. 10/29/22 11/03/22 Yes Arnetha Courser, MD  Blood Glucose Monitoring Suppl DEVI 1 each by Does not apply route in the morning, at noon, and at bedtime. May substitute to any manufacturer covered by patient's insurance. 10/29/22  Yes Arnetha Courser, MD  doxepin (SINEQUAN) 25 MG capsule Take by mouth. 06/13/19  Yes [provider]  Glucose Blood (BLOOD GLUCOSE TEST STRIPS) STRP 1 each by In Vitro route in the morning, at noon, and at bedtime. May substitute to any manufacturer covered by patient's insurance. 10/29/22 11/28/22 Yes Arnetha Courser, MD  ibuprofen (ADVIL) 400 MG tablet Take 400 mg by mouth every 6 (six) hours as needed. 10/15/22  Yes [provider]  lactulose (CEPHULAC) 10 g packet Take by mouth. 05/25/18  Yes [provider]  Lancet Device MISC 1 each by Does not apply route in the morning, at noon, and at bedtime. May substitute to any manufacturer covered by patient's insurance. 10/29/22 11/28/22 Yes Arnetha Courser, MD  Lancets Misc. MISC 1 each by Does not apply route in the morning, at noon, and at bedtime. May substitute to any manufacturer covered by patient's insurance. 10/29/22 11/28/22 Yes Arnetha Courser, MD  levothyroxine (SYNTHROID) 100 MCG tablet Take 100 mcg by mouth daily. 08/03/22  Yes [provider]  levothyroxine (SYNTHROID) 112 MCG tablet Take 112 mcg by mouth daily. 05/14/22  Yes [provider]  levothyroxine (SYNTHROID) 125 MCG tablet Take 1 tablet by mouth daily. 06/13/19  Yes [provider]  lisinopril (ZESTRIL) 30 MG tablet Take 1  tablet by mouth daily. 06/13/19  Yes [provider]  metFORMIN (GLUCOPHAGE-XR) 500 MG 24 hr tablet Take 1 tablet (500 mg total) by mouth 2 (two) times daily with a meal. 10/29/22  Yes Arnetha Courser, MD  nitrofurantoin, macrocrystal-monohydrate, (MACROBID) 100 MG capsule Take 100 mg by mouth 2 (two) times daily. 10/17/22  Yes [provider]  omeprazole (PRILOSEC) 20 MG capsule Take 1 capsule by mouth daily. 06/13/19  Yes [provider]  topiramate (TOPAMAX) 50 MG tablet Take by mouth. 06/13/19  Yes [provider]  venlafaxine XR (EFFEXOR-XR) 150 MG  24 hr capsule Take 1 capsule by mouth daily. 06/13/19  Yes [provider]  venlafaxine XR (EFFEXOR-XR) 75 MG 24 hr capsule Take by mouth. 06/13/19  Yes [provider]  zolpidem (AMBIEN CR) 6.25 MG CR tablet Take 6.25 mg by mouth at bedtime as needed. 09/07/22  Yes [provider]  zolpidem (AMBIEN) 5 MG tablet Take 5 mg by mouth at bedtime as needed. 08/11/22  Yes [provider]  albuterol (VENTOLIN HFA) 108 (90 Base) MCG/ACT inhaler Inhale 2 puffs into the lungs every 8 (eight) hours as needed for wheezing or shortness of breath. 03/19/22   [provider]  alendronate (FOSAMAX) 70 MG tablet Take 70 mg by mouth every Monday. 08/07/22   [provider]  calcium carbonate (TUMS EX) 750 MG chewable tablet Chew 1 tablet by mouth 3 (three) times daily as needed for heartburn (and/or indigestion).    [provider]  cloNIDine (CATAPRES) 0.1 MG tablet Take 0.1 mg by mouth 2 (two) times daily. 07/06/22   [provider]  cyanocobalamin (VITAMIN B12) 1000 MCG tablet Take 3,000 mcg by mouth daily.    [provider]  desvenlafaxine (PRISTIQ) 50 MG 24 hr tablet Take 50 mg by mouth daily. 04/16/22   [provider]  divalproex (DEPAKOTE SPRINKLE) 125 MG capsule Take 125 mg by mouth 2 (two) times daily.    [provider]  gabapentin  (NEURONTIN) 600 MG tablet Take 600 mg by mouth 3 (three) times daily. 07/09/22   [provider]  guaiFENesin (ROBITUSSIN) 100 MG/5ML liquid Take 5 mLs by mouth every 8 (eight) hours as needed for cough or to loosen phlegm.    [provider]  ibuprofen (ADVIL) 800 MG tablet Take 1 tablet (800 mg total) by mouth 3 (three) times daily. Patient taking differently: Take 400 mg by mouth every 6 (six) hours as needed for mild pain or moderate pain. 08/20/22   Particia Nearing, PA-C  levothyroxine (SYNTHROID) 88 MCG tablet Take 88 mcg by mouth daily before breakfast.    [provider]  Liniments (V-R PAIN RELIEVING) GEL Apply 1 Application topically 2 (two) times daily after a meal. Applied to lower back as directed    [provider]  lisinopril (PRINIVIL,ZESTRIL) 20 MG tablet Take 20 mg by mouth daily.    [provider]  nystatin (MYCOSTATIN) 100000 UNIT/ML suspension Take 5 mLs by mouth 4 (four) times daily. 10 DAY COURSE STARTING ON 10/12/22 AND TO BE COMPLETED ON 10/22/2022    [provider]  ondansetron (ZOFRAN) 4 MG tablet Take 1 tablet (4 mg total) by mouth every 6 (six) hours as needed for nausea. 10/20/22   Lucretia Roers, MD  oxyCODONE (OXY IR/ROXICODONE) 5 MG immediate release tablet Take 1 tablet (5 mg total) by mouth every 4 (four) hours as needed for severe pain or breakthrough pain. 10/20/22   Lucretia Roers, MD  pantoprazole (PROTONIX) 40 MG tablet Take 40 mg by mouth daily. 07/06/22   [provider]  pravastatin (PRAVACHOL) 40 MG tablet Take 40 mg by mouth daily. 07/06/22   [provider]  psyllium (REGULOID) 0.52 g capsule Take 0.52 g by mouth at bedtime.    [provider]  ramelteon (ROZEREM) 8 MG tablet Take 8 mg by mouth at bedtime.    [provider]  tiZANidine (ZANAFLEX) 4 MG tablet Take 4 mg by mouth every 8 (eight) hours as needed for muscle spasms.    [provider]   TRELEGY ELLIPTA 200-62.5-25 MCG/ACT AEPB Inhale 1 puff into the lungs daily. *RINSE MOUTH WITH WATER AND SPIT DAILY 08/06/22   [provider]  zolpidem (AMBIEN CR) 12.5 MG CR tablet Take 12.5 mg by mouth at bedtime.    [provider]    Current Facility-Administered Medications  Medication Dose Route Frequency Provider Last Rate Last Admin   acetaminophen (TYLENOL) tablet 650 mg  650 mg Oral Q6H PRN Russella Dar, NP   650 mg at 10/27/22 1744   Or   acetaminophen (TYLENOL) suppository 650 mg  650 mg Rectal Q6H PRN Russella Dar, NP       Ampicillin-Sulbactam (UNASYN) 3 g in sodium chloride 0.9 % 100 mL IVPB  3 g Intravenous Q6H Earnie Larsson, RPH 200 mL/hr at 10/29/22 0540 3 g at 10/29/22 0540   bisacodyl (DULCOLAX) suppository 10 mg  10 mg Rectal Daily PRN Joseph Art, DO   10 mg at 10/28/22 2354   cloNIDine (CATAPRES) tablet 0.1 mg  0.1 mg Oral BID Russella Dar, NP   0.1 mg at 10/29/22 0920   divalproex (DEPAKOTE SPRINKLE) capsule 125 mg  125 mg Oral BID Russella Dar, NP   125 mg at 10/29/22 1610   docusate sodium (COLACE) capsule 100 mg  100 mg Oral BID Lucretia Roers, MD   100 mg at 10/29/22 0922   fluticasone furoate-vilanterol (BREO ELLIPTA) 200-25 MCG/ACT 1 puff  1 puff Inhalation Daily Russella Dar, NP   1 puff at 10/29/22 0730   And   umeclidinium bromide (INCRUSE ELLIPTA) 62.5 MCG/ACT 1 puff  1 puff Inhalation Daily Russella Dar, NP   1 puff at 10/29/22 0730   gabapentin (NEURONTIN) capsule 600 mg  600 mg Oral TID Russella Dar, NP   600 mg at 10/29/22 0919   heparin injection 5,000 Units  5,000 Units Subcutaneous Q8H Russella Dar, NP   5,000 Units at 10/29/22 0540   hydrALAZINE (APRESOLINE) injection 5 mg  5 mg Intravenous Q4H PRN Vann, Jessica U, DO       insulin aspart (novoLOG) injection 0-5 Units  0-5 Units Subcutaneous QHS Amin, Tilman Neat, MD       insulin aspart (novoLOG) injection 0-9 Units  0-9 Units Subcutaneous TID WC  Arnetha Courser, MD   3 Units at 10/28/22 1757   ketorolac (TORADOL) 15 MG/ML injection 15 mg  15 mg Intravenous Q6H PRN Russella Dar, NP   15 mg at 10/29/22 9604   levothyroxine (SYNTHROID) tablet 88 mcg  88 mcg Oral QAC breakfast Russella Dar, NP   88 mcg at 10/29/22 0541   lisinopril (ZESTRIL) tablet 20 mg  20 mg Oral Daily Russella Dar, NP   20 mg at 10/29/22 5409   morphine (PF) 2 MG/ML injection 2 mg  2 mg Intravenous Q2H PRN Russella Dar, NP   2 mg at 10/27/22 2039   nystatin (MYCOSTATIN) 100000 UNIT/ML suspension 500,000 Units  5 mL Oral QID Lucretia Roers, MD   500,000 Units at 10/29/22 0921   ondansetron (ZOFRAN) tablet 4 mg  4 mg Oral Q6H PRN Russella Dar, NP       Or   ondansetron [facility]) injection 4 mg  4 mg Intravenous Q6H PRN Russella Dar, NP       pantoprazole (PROTONIX) EC tablet 40 mg  40 mg Oral Daily Arnetha Courser, MD   40 mg at 10/29/22 [phone]  polyethylene glycol (MIRALAX / GLYCOLAX) packet 17 g  17 g Oral Daily Marlin Canary U, DO   17 g at 10/29/22 0919   pravastatin (PRAVACHOL) tablet 40 mg  40 mg Oral Daily Russella Dar, NP   40 mg at 10/29/22 1610   ramelteon (ROZEREM) tablet 8 mg  8 mg Oral QHS Russella Dar, NP   8 mg at 10/28/22 2139   sodium chloride flush (NS) 0.9 % injection 3 mL  3 mL Intravenous Q12H Russella Dar, NP   3 mL at 10/29/22 0921   sodium chloride flush (NS) 0.9 % injection 5 mL  5 mL Intracatheter Q8H Gilmer Mor, DO   5 mL at 10/28/22 2133   venlafaxine XR (EFFEXOR-XR) 24 hr capsule 75 mg  75 mg Oral Q breakfast Russella Dar, NP   75 mg at 10/29/22 9604   zolpidem (AMBIEN) tablet 5 mg  5 mg Oral QHS Russella Dar, NP   5 mg at 10/28/22 2126    Allergies as of 10/26/2022   (No Known Allergies)    Social History   Socioeconomic History   Marital status: Widowed    Spouse name: Not on file   Number of children: Not on file   Years of education: Not on file   Highest education level: Not on file   Occupational History   Not on file  Tobacco Use   Smoking status: Every Day    Packs/day: 0.25    Years: 30.00    Additional pack years: 0.00    Total pack years: 7.50    Types: Cigarettes   Smokeless tobacco: Never  Substance and Sexual Activity   Alcohol use: Yes    Alcohol/week: 6.0 standard drinks of alcohol    Types: 6 Cans of beer per week   Drug use: No   Sexual activity: Yes    Birth control/protection: Post-menopausal  Other Topics Concern   Not on file  Social History Narrative   Not on file   Social Determinants of Health   Financial Resource Strain: Not on file  Food Insecurity: No Food Insecurity (10/27/2022)   Hunger Vital Sign    Worried About Running Out of Food in the Last Year: Never true    Ran Out of Food in the Last Year: Never true  Transportation Needs: No Transportation Needs (10/27/2022)   PRAPARE - Administrator, Civil Service (Medical): No    Lack of Transportation (Non-Medical): No  Physical Activity: Not on file  Stress: Not on file  Social Connections: Not on file  Intimate Partner Violence: Not At Risk (10/27/2022)   Humiliation, Afraid, Rape, and Kick questionnaire    Fear of Current or Ex-Partner: No    Emotionally Abused: No    Physically Abused: No    Sexually Abused: No    Review of Systems: All systems reviewed and negative except where noted in HPI.  Physical Exam: Vital signs in last 24 hours: Temp:  [98 F (36.7 C)-98.8 F (37.1 C)] 98.1 F (36.7 C) (04/25 0736) Pulse Rate:  [61-73] 68 (04/25 0933) Resp:  [16-17] 17 (04/25 0540) BP: (152-181)/(62-77) 181/62 (04/25 0933) SpO2:  [94 %-97 %] 97 % (04/25 0736) Last BM Date : 10/23/22  General:  Alert female in NAD Psych:  Pleasant, cooperative. Normal mood and affect Eyes: Pupils equal Ears:  Normal auditory acuity Nose: No deformity, discharge or lesions Neck:  Supple, no masses felt Lungs:  Clear to auscultation.  Heart:  Regular rate, regular rhythm.   Abdomen:  Soft, nondistended, nontender, active bowel sounds, no masses felt. RUQ percutaneous drain in place Rectal :  Deferred Msk: Symmetrical without gross deformities.  Neurologic:  Alert, oriented, grossly normal neurologically Extremities : No edema Skin:  Intact without significant lesions.    Intake/Output from previous day: 04/24 0701 - 04/25 0700 In: 236 [P.O.:236] Out: 250 [Drains:250] Intake/Output this shift:  No intake/output data recorded.    Principal Problem:   Abdominal pain Active Problems:   Breast mass, right-Needs mammogram   Hypothyroidism   HTN (hypertension)   Constipation   Diabetes mellitus   Fluid collection at surgical site    Willette Cluster, NP-C @  10/29/2022, 11:05 AM

## 2022-10-29 NOTE — Progress Notes (Signed)
Referring Physician(s): Dr. Henreitta Leber   Supervising Physician: Pernell Dupre  Patient Status:  [facility] - In-pt  Chief Complaint: Cholecystectomy with post-op bile leak. S/p drain placement in IR 10/28/22  Subjective: Patient sitting up in bed with no complaints at this time. She is a little nervous/anxious about returning to her ALF with a drain in place. I assured her that her team at the ALF should be able to assist her with drain care.   Allergies: Patient has no known allergies.  Medications: Prior to Admission medications   Medication Sig Start Date End Date Taking? Authorizing Provider  amoxicillin (AMOXIL) 500 MG capsule Take by mouth. 09/01/22  Yes [provider]  amoxicillin-clavulanate (AUGMENTIN) 875-125 MG tablet Take 1 tablet by mouth 2 (two) times daily for 5 days. 10/29/22 11/03/22 Yes Arnetha Courser, MD  Blood Glucose Monitoring Suppl DEVI 1 each by Does not apply route in the morning, at noon, and at bedtime. May substitute to any manufacturer covered by patient's insurance. 10/29/22  Yes Arnetha Courser, MD  doxepin (SINEQUAN) 25 MG capsule Take by mouth. 06/13/19  Yes [provider]  Glucose Blood (BLOOD GLUCOSE TEST STRIPS) STRP 1 each by In Vitro route in the morning, at noon, and at bedtime. May substitute to any manufacturer covered by patient's insurance. 10/29/22 11/28/22 Yes Arnetha Courser, MD  ibuprofen (ADVIL) 400 MG tablet Take 400 mg by mouth every 6 (six) hours as needed. 10/15/22  Yes [provider]  lactulose (CEPHULAC) 10 g packet Take by mouth. 05/25/18  Yes [provider]  Lancet Device MISC 1 each by Does not apply route in the morning, at noon, and at bedtime. May substitute to any manufacturer covered by patient's insurance. 10/29/22 11/28/22 Yes Arnetha Courser, MD  Lancets Misc. MISC 1 each by Does not apply route in the morning, at noon, and at bedtime. May substitute to any manufacturer covered by patient's insurance.  10/29/22 11/28/22 Yes Arnetha Courser, MD  levothyroxine (SYNTHROID) 100 MCG tablet Take 100 mcg by mouth daily. 08/03/22  Yes [provider]  levothyroxine (SYNTHROID) 112 MCG tablet Take 112 mcg by mouth daily. 05/14/22  Yes [provider]  levothyroxine (SYNTHROID) 125 MCG tablet Take 1 tablet by mouth daily. 06/13/19  Yes [provider]  lisinopril (ZESTRIL) 30 MG tablet Take 1 tablet by mouth daily. 06/13/19  Yes [provider]  metFORMIN (GLUCOPHAGE-XR) 500 MG 24 hr tablet Take 1 tablet (500 mg total) by mouth 2 (two) times daily with a meal. 10/29/22  Yes Arnetha Courser, MD  nitrofurantoin, macrocrystal-monohydrate, (MACROBID) 100 MG capsule Take 100 mg by mouth 2 (two) times daily. 10/17/22  Yes [provider]  omeprazole (PRILOSEC) 20 MG capsule Take 1 capsule by mouth daily. 06/13/19  Yes [provider]  topiramate (TOPAMAX) 50 MG tablet Take by mouth. 06/13/19  Yes [provider]  venlafaxine XR (EFFEXOR-XR) 150 MG 24 hr capsule Take 1 capsule by mouth daily. 06/13/19  Yes [provider]  venlafaxine XR (EFFEXOR-XR) 75 MG 24 hr capsule Take by mouth. 06/13/19  Yes [provider]  zolpidem (AMBIEN CR) 6.25 MG CR tablet Take 6.25 mg by mouth at bedtime as needed. 09/07/22  Yes [provider]  zolpidem (AMBIEN) 5 MG tablet Take 5 mg by mouth at bedtime as needed. 08/11/22  Yes [provider]  albuterol (VENTOLIN HFA) 108 (90 Base) MCG/ACT inhaler Inhale 2 puffs into the lungs every 8 (eight) hours as needed for  wheezing or shortness of breath. 03/19/22   [provider]  alendronate (FOSAMAX) 70 MG tablet Take 70 mg by mouth every Monday. 08/07/22   [provider]  calcium carbonate (TUMS EX) 750 MG chewable tablet Chew 1 tablet by mouth 3 (three) times daily as needed for heartburn (and/or indigestion).    [provider]  cloNIDine (CATAPRES) 0.1 MG tablet Take 0.1 mg by mouth  2 (two) times daily. 07/06/22   [provider]  cyanocobalamin (VITAMIN B12) 1000 MCG tablet Take 3,000 mcg by mouth daily.    [provider]  desvenlafaxine (PRISTIQ) 50 MG 24 hr tablet Take 50 mg by mouth daily. 04/16/22   [provider]  divalproex (DEPAKOTE SPRINKLE) 125 MG capsule Take 125 mg by mouth 2 (two) times daily.    [provider]  gabapentin (NEURONTIN) 600 MG tablet Take 600 mg by mouth 3 (three) times daily. 07/09/22   [provider]  guaiFENesin (ROBITUSSIN) 100 MG/5ML liquid Take 5 mLs by mouth every 8 (eight) hours as needed for cough or to loosen phlegm.    [provider]  ibuprofen (ADVIL) 800 MG tablet Take 1 tablet (800 mg total) by mouth 3 (three) times daily. Patient taking differently: Take 400 mg by mouth every 6 (six) hours as needed for mild pain or moderate pain. 08/20/22   Particia Nearing, PA-C  levothyroxine (SYNTHROID) 88 MCG tablet Take 88 mcg by mouth daily before breakfast.    [provider]  Liniments (V-R PAIN RELIEVING) GEL Apply 1 Application topically 2 (two) times daily after a meal. Applied to lower back as directed    [provider]  lisinopril (PRINIVIL,ZESTRIL) 20 MG tablet Take 20 mg by mouth daily.    [provider]  nystatin (MYCOSTATIN) 100000 UNIT/ML suspension Take 5 mLs by mouth 4 (four) times daily. 10 DAY COURSE STARTING ON 10/12/22 AND TO BE COMPLETED ON 10/22/2022    [provider]  ondansetron (ZOFRAN) 4 MG tablet Take 1 tablet (4 mg total) by mouth every 6 (six) hours as needed for nausea. 10/20/22   Lucretia Roers, MD  oxyCODONE (OXY IR/ROXICODONE) 5 MG immediate release tablet Take 1 tablet (5 mg total) by mouth every 4 (four) hours as needed for severe pain or breakthrough pain. 10/20/22   Lucretia Roers, MD  pantoprazole (PROTONIX) 40 MG tablet Take 40 mg by mouth daily. 07/06/22   [provider]  pravastatin (PRAVACHOL) 40 MG  tablet Take 40 mg by mouth daily. 07/06/22   [provider]  psyllium (REGULOID) 0.52 g capsule Take 0.52 g by mouth at bedtime.    [provider]  ramelteon (ROZEREM) 8 MG tablet Take 8 mg by mouth at bedtime.    [provider]  tiZANidine (ZANAFLEX) 4 MG tablet Take 4 mg by mouth every 8 (eight) hours as needed for muscle spasms.    [provider]  Dwyane Luo 200-62.5-25 MCG/ACT AEPB Inhale 1 puff into the lungs daily. *RINSE MOUTH WITH WATER AND SPIT DAILY 08/06/22   [provider]  zolpidem (AMBIEN CR) 12.5 MG CR tablet Take 12.5 mg by mouth at bedtime.    [provider]     Vital Signs: BP (!) 181/62   Pulse 68   Temp 98.1 F (36.7 C) (Oral)   Resp 17   Ht 5\' 3"  (1.6 m)   Wt 157 lb 3 oz (71.3 kg)   SpO2 97%   BMI 27.84  kg/m   Physical Exam Constitutional:      General: She is not in acute distress.    Appearance: She is not ill-appearing.  Pulmonary:     Effort: Pulmonary effort is normal.  Abdominal:     Palpations: Abdomen is soft.     Comments: RUQ drain to gravity. Site is clean, dry and intact. Approximately 30 ml of bile in gravity bag. Drain easily flushed with 5 ml NS.   Skin:    General: Skin is warm and dry.  Neurological:     Mental Status: She is alert and oriented to person, place, and time.  Psychiatric:        Mood and Affect: Mood normal.        Behavior: Behavior normal.        Thought Content: Thought content normal.        Judgment: Judgment normal.     Imaging: CT GUIDED PERITONEAL/RETROPERITONEAL FLUID DRAIN BY PERC CATH  Result Date: 10/28/2022 INDICATION: 67 year old female with biloma referred for drainage EXAM: CT-GUIDED ABDOMINAL DRAINAGE TECHNIQUE: Multidetector CT imaging of the abdomen was performed following the standard protocol without IV contrast. RADIATION DOSE REDUCTION: This exam was performed according to the departmental dose-optimization program which includes  automated exposure control, adjustment of the mA and/or kV according to patient size and/or use of iterative reconstruction technique. MEDICATIONS: The patient is currently admitted to the hospital and receiving intravenous antibiotics. The antibiotics were administered within an appropriate time frame prior to the initiation of the procedure. ANESTHESIA/SEDATION: Moderate (conscious) sedation was employed during this procedure. A total of Versed 1.5 mg and Fentanyl 100 mcg was administered intravenously by the radiology nurse. Total intra-service moderate Sedation Time: 15 minutes. The patient's level of consciousness and vital signs were monitored continuously by radiology nursing throughout the procedure under my direct supervision. COMPLICATIONS: None PROCEDURE: Informed written consent was obtained from the patient after a thorough discussion of the procedural risks, benefits and alternatives. All questions were addressed. Maximal Sterile Barrier Technique was utilized including caps, mask, sterile gowns, sterile gloves, sterile drape, hand hygiene and skin antiseptic. A timeout was performed prior to the initiation of the procedure. Patient was positioned supine on the CT gantry table. Scout CT was acquired for planning purposes. The patient is prepped and draped in the usual sterile fashion. 1% lidocaine was used for local anesthesia. Using CT guidance, Yueh needle was advanced into the biloma of the right upper quadrant. Once we confirmed needle tip position modified Seldinger technique was used to place a 12 [place] drain. Spontaneous the drainage clear bile was encountered under pressure. CT was acquired. Drain was repositioned in a more superficial location near the margin of the liver. Repeat CT was acquired. The drain was then sutured in position and attached to gravity bag. Proximally 100 cc bile was vacuum weighted. Sample was sent for culture. Patient tolerated the procedure well and remained  hemodynamically stable throughout. No complications were encountered and no significant blood loss. IMPRESSION: Status post CT-guided drainage of right upper quadrant biloma. Signed, Yvone Neu. Miachel Roux, RPVI [facility] Electronically Signed   By: Gilmer Mor D.O.   On: 10/28/2022 13:48   NM HEPATOBILIARY LEAK (POST-SURGICAL)  Result Date: 10/27/2022 CLINICAL DATA:  Postoperative abdominal pain, perihepatic fluid collection by CT question bile leak EXAM: NUCLEAR MEDICINE HEPATOBILIARY IMAGING TECHNIQUE: Sequential images of the abdomen were obtained out to 60 minutes following intravenous administration of radiopharmaceutical. RADIOPHARMACEUTICALS:  5.5  mCi Tc-25m  Choletec IV COMPARISON:  CT abdomen and pelvis 10/27/2022 FINDINGS: Normal tracer extraction from bloodstream indicating normal hepatocellular function. Prompt excretion of tracer into biliary tree. Small bowel visualized at 14 minutes. Gallbladder surgically absent. Beginning at 11 minutes, abnormal tracer accumulates at the lateral margin of the liver corresponding to fluid collection on CT consistent with bile leak. IMPRESSION: Post cholecystectomy. Patent biliary tree with tracer into small bowel. Abnormal localization of tracer within fluid collection lateral to the RIGHT lobe of the liver consistent with bile leak. Findings called to Dr. Lovell Sheehan on 10/27/2022 at 1130 hours. Electronically Signed   By: Ulyses Southward M.D.   On: 10/27/2022 11:44   DG Chest Port 1 View  Result Date: 10/27/2022 CLINICAL DATA:  Shortness of breath, vomiting, fever, recent cholecystectomy EXAM: PORTABLE CHEST 1 VIEW COMPARISON:  10/17/2022 FINDINGS: Cardiac size is within normal limits. There are no signs of alveolar pulmonary edema. There is interval appearance of small right pleural effusion. Transverse linear density in right parahilar region may suggest subsegmental atelectasis. There is no  pneumothorax. Degenerative changes are noted in left shoulder. IMPRESSION: There are no signs of alveolar pulmonary edema or focal pulmonary consolidation. Small right pleural effusion. Linear density in right parahilar region may suggest subsegmental atelectasis. Electronically Signed   By: Ernie Avena M.D.   On: 10/27/2022 09:24   CT ABDOMEN PELVIS W CONTRAST  Result Date: 10/27/2022 CLINICAL DATA:  Postoperative abdominal pain. EXAM: CT ABDOMEN AND PELVIS WITH CONTRAST TECHNIQUE: Multidetector CT imaging of the abdomen and pelvis was performed using the standard protocol following bolus administration of intravenous contrast. RADIATION DOSE REDUCTION: This exam was performed according to the departmental dose-optimization program which includes automated exposure control, adjustment of the mA and/or kV according to patient size and/or use of iterative reconstruction technique. CONTRAST:  OMNIPAQUE IOHEXOL 300 MG/ML  SOLN COMPARISON:  October 17, 2022 FINDINGS: Lower chest: Mild to moderate severity areas of atelectasis are seen within the bilateral lung bases, right greater than left. A small right pleural effusion is noted. Hepatobiliary: A stable subcentimeter cyst is seen within the anterior aspect of the left lobe of the liver. Status post cholecystectomy. A moderate to marked amount of fluid and a small amount of air is seen within the gallbladder fossa. This extends along the anterior and lateral aspect of the right lobe of the liver. A 7.3 cm x 6.2 cm x 4.5 cm well-defined collection of fluid is also seen inferior to the right lobe of the liver (axial CT images 30 through 41, CT series 2). This a appears to connect to the gallbladder fossa (best seen on coronal reformatted images 37 through 40, CT series 6). No postoperative abscesses are identified. The common bile duct measures 12 mm in diameter. Pancreas: Unremarkable. No pancreatic ductal dilatation or surrounding inflammatory changes.  Spleen: Normal in size without focal abnormality. Adrenals/Urinary Tract: Adrenal glands are unremarkable. Kidneys are normal, without renal calculi, focal lesion, or hydronephrosis. Bladder is unremarkable. Stomach/Bowel: Stomach is within normal limits. Appendix appears normal. No evidence of bowel wall thickening, distention, or inflammatory changes. Noninflamed diverticula are seen throughout the proximal to mid sigmoid colon. Vascular/Lymphatic: Aortic atherosclerosis. No enlarged abdominal or pelvic lymph nodes. Reproductive: Uterus and bilateral adnexa are unremarkable. Other: No abdominal wall hernia or abnormality. No abdominopelvic ascites. Musculoskeletal: Multilevel degenerative changes seen throughout the lumbar spine. IMPRESSION: 1. Status post cholecystectomy with a marked amount of postoperative perihepatic and right upper quadrant fluid, as described above.  2. Sigmoid diverticulosis. 3. Mild to moderate severity bilateral basilar atelectasis, right greater than left. 4. Small right pleural effusion. 5. Aortic atherosclerosis. Aortic Atherosclerosis (ICD10-I70.0). Electronically Signed   By: Aram Candela M.D.   On: 10/27/2022 00:23    Labs:  CBC: Recent Labs    10/19/22 0558 10/20/22 0438 10/26/22 2020 10/28/22 0421  WBC 4.8 8.2 6.3 6.4  HGB 10.5* 10.0* 9.4* 10.4*  HCT 32.6* 31.6* 28.3* 30.8*  PLT 216 224 243 298    COAGS: Recent Labs    10/18/22 0437  INR 1.2    BMP: Recent Labs    10/19/22 0558 10/20/22 0438 10/26/22 2020 10/28/22 0421  NA 139 135 134* 139  K 3.4* 4.6 3.5 4.4  CL 107 106 101 100  CO2 GLUCOSE 136* 167* 229* 125*  BUN 8 11 7* 5*  CALCIUM 7.8* 7.6* 7.9* 8.2*  CREATININE 0.75 0.94 0.94 0.91  GFRNONAA >60 >60 >60 >60    LIVER FUNCTION TESTS: Recent Labs    10/19/22 0558 10/20/22 0438 10/26/22 2020 10/28/22 0421  BILITOT 0.3 0.4 0.4 0.5  AST 24 49* 15 12*  ALT 80* 80* 20 19  ALKPHOS 91 94 98 108  PROT 5.6* 5.8* 6.1*  5.7*  ALBUMIN 2.9* 3.0* 2.7* 2.3*    Assessment and Plan:  Cholecystectomy with post-op bile leak. S/p biloma drain placement in IR 10/28/22  Patient with plans for discharge today. She will return to her assisted living facility.  Drain Location: RUQ Size: Fr size: 12 Fr Date of placement: 10/28/22 Currently to: Drain collection device: gravity 24 hour output:  Output by Drain (mL) 10/27/22 0700 - 10/27/22 1459 10/27/22 1500 - 10/27/22 2259 10/27/22 2300 - 10/28/22 0659 10/28/22 0700 - 10/28/22 1459 10/28/22 1500 - 10/28/22 2259 10/28/22 2300 - 10/29/22 0659 10/29/22 0700 - 10/29/22 1047  Closed System Drain 1 Right RLQ Other (Comment) 12 Fr.    100 100 50     Interval imaging/drain manipulation:  none  Current examination: Flushes/aspirates easily.  Insertion site unremarkable. Suture and stat lock in place. Dressed appropriately.   Plan: Continue TID flushes with 5 cc NS. Record output Q shift. Dressing changes QD or PRN if soiled.  Call IR APP or on call IR MD if difficulty flushing or sudden change in drain output.  Repeat imaging/possible drain injection once output < 10 mL/QD (excluding flush material). Consideration for drain removal if output is < 10 mL/QD (excluding flush material), pending discussion with the providing surgical service.  Discharge planning: An order has been placed for a scheduler to call this patient and arrange a drain evaluation in 10-14 days. IR scheduler will contact patient with date/time of appointment. Patient will need to flush drain QD with 5 cc NS, record output QD, dressing changes every 2-3 days or earlier if soiled. Drain care teaching has been performed and the patient was given enough saline flushes to last until her follow up appointment. I told the patient her team at the assisted living facility should be able to provide drain care but the patient is also aware of drain care instructions and she was shown how to flush/empty the drain.    Electronically Signed: Alwyn Ren, AGACNP-BC [phone] 10/29/2022, 10:38 AM   I spent a total of 15 Minutes at the the patient's bedside AND on the patient's hospital floor or unit, greater than 50% of which was counseling/coordinating care for biloma drain.

## 2022-10-29 NOTE — Discharge Summary (Signed)
Physician Discharge Summary   Patient: Kristin [name] MRN: [id number] DOB: 12/03/1955  Admit date:     10/26/2022  Discharge date: 10/29/22  Discharge Physician: Arnetha Courser   PCP: Catalina Lunger, DO   Recommendations at discharge:  Please obtain CBC and BMP in 1 week Patient is being given a 5-day course of Augmentin-please ensure that she completes the course. Please follow-up on drain culture results. Follow-up in drain clinic Follow-up with primary care provider-patient is being started on metformin due to elevated A1c, this will be a new diagnosis of diabetes which need further management. Patient also need mammography for concern of a breast lesion on imaging. Follow-up with general surgery  Discharge Diagnoses: Principal Problem:   Abdominal pain Active Problems:   HTN (hypertension)   Diabetes mellitus   Constipation   Hypothyroidism   Breast mass, right-Needs mammogram   Fluid collection at surgical site   Hospital Course: Taken from H&P.   Kristin [name] is a 67 y.o. female with medical history significant of COPD, hypertension, hypothyroidism, dyslipidemia, anxiety and recent laparoscopic cholecystectomy 1 week ago presented to [facility], ED with worsening abdominal pain.  On presentation she was hemodynamically stable.CT abdomen and pelvis revealed mild to moderate severe areas of atelectasis within the bilateral lung bases with a small right pleural effusion. She was also found to have a fluid collection in the gallbladder fossa measuring 7.3 x 6.2 x 4.5 cm this area is well-defined. Common bile duct 12 mm.  General surgery was consulted and for concern of bile leak versus hematoma, HIDA scan was ordered.  HIDA scan was positive for leak.  Patient was transferred to [facility] s/p drain placement by IR.  4/24: Patient was feeling much improved after drain placement this morning.  Cultures pending.  4/25: Blood pressure elevated this morning which improved  after taking home medications.  Preliminary drain cultures negative so far.  No more abdominal pain and patient had a small bowel movement.  She wants to go home.  She is being discharged on 5 days of Augmentin. She was also started on metformin and given a prescription for glucometer.  She need to follow-up with her primary care provider for further management of her newly diagnosed diabetes.  Patient also need to have her mammogram for the concern of breast lesion on imaging during current hospitalization.  She was on multiple doses of same medications, which hide over the past 2 clear of her medication list.  Her PCP should be able to do a good med rec and cleared the list appropriately.  Patient will continue on her home medications except with addition of Augmentin for 5 days and metformin and need to have a close follow-up with her providers for further management.  Patient is being discharged with RUQ drain and she will follow-up at drain clinic.    Assessment and Plan: * Abdominal pain Biliary leak.  History of laparoscopic cholecystectomy 1 week ago. Found to have positive HIDA scan for biliary leak s/p drain placement by IR. -Follow-up cultures -Continue with Unasyn -Continue with pain management and supportive care  HTN (hypertension) -Continue home clonidine and lisinopril  Diabetes mellitus Patient was found to have hyperglycemia without prior diagnosis of diabetes.  A1c was checked and found to be elevated at 7.2 which makes her diabetic. -Start her on SSI -Might need p.o. meds on discharge with a close PCP follow-up  Constipation -Continue with bowel regimen  Hypothyroidism -Continue Synthroid  Breast mass, right-Needs mammogram  Incidental finding during previous hospitalization Will need mammogram outpatient after discharge   Pain control - [facility] Controlled Substance Reporting System database was reviewed. and patient was instructed, not to drive,  operate heavy machinery, perform activities at heights, swimming or participation in water activities or provide baby-sitting services while on Pain, Sleep and Anxiety Medications; until their outpatient Physician has advised to do so again. Also recommended to not to take more than prescribed Pain, Sleep and Anxiety Medications.  Consultants: General surgery.  Interventional radiology Procedures performed: IR guided drain placement Disposition: Assisted living Diet recommendation:  Discharge Diet Orders (From admission, onward)     Start     Ordered   10/29/22 0000  Diet - low sodium heart healthy        10/29/22 0955           Cardiac and Carb modified diet DISCHARGE MEDICATION: Allergies as of 10/29/2022   No Known Allergies      Medication List     STOP taking these medications    amoxicillin 500 MG capsule Commonly known as: AMOXIL   desvenlafaxine 50 MG 24 hr tablet Commonly known as: PRISTIQ   nitrofurantoin (macrocrystal-monohydrate) 100 MG capsule Commonly known as: MACROBID   pantoprazole 40 MG tablet Commonly known as: PROTONIX       TAKE these medications    albuterol 108 (90 Base) MCG/ACT inhaler Commonly known as: VENTOLIN HFA Inhale 2 puffs into the lungs every 8 (eight) hours as needed for wheezing or shortness of breath.   alendronate 70 MG tablet Commonly known as: FOSAMAX Take 70 mg by mouth every Monday.   amoxicillin-clavulanate 875-125 MG tablet Commonly known as: AUGMENTIN Take 1 tablet by mouth 2 (two) times daily for 5 days.   Blood Glucose Monitoring Suppl Devi 1 each by Does not apply route in the morning, at noon, and at bedtime. May substitute to any manufacturer covered by patient's insurance.   BLOOD GLUCOSE TEST STRIPS Strp 1 each by In Vitro route in the morning, at noon, and at bedtime. May substitute to any manufacturer covered by patient's insurance.   calcium carbonate 750 MG chewable tablet Commonly known as: TUMS  EX Chew 1 tablet by mouth 3 (three) times daily as needed for heartburn (and/or indigestion).   cloNIDine 0.1 MG tablet Commonly known as: CATAPRES Take 0.1 mg by mouth 2 (two) times daily.   cyanocobalamin 1000 MCG tablet Commonly known as: VITAMIN B12 Take 3,000 mcg by mouth daily.   divalproex 125 MG capsule Commonly known as: DEPAKOTE SPRINKLE Take 125 mg by mouth 2 (two) times daily.   doxepin 25 MG capsule Commonly known as: SINEQUAN Take by mouth.   gabapentin 600 MG tablet Commonly known as: NEURONTIN Take 600 mg by mouth 3 (three) times daily.   guaiFENesin 100 MG/5ML liquid Commonly known as: ROBITUSSIN Take 5 mLs by mouth every 8 (eight) hours as needed for cough or to loosen phlegm.   ibuprofen 400 MG tablet Commonly known as: ADVIL Take 400 mg by mouth every 6 (six) hours as needed. What changed: Another medication with the same name was removed. Continue taking this medication, and follow the directions you see here.   lactulose 10 g packet Commonly known as: CEPHULAC Take by mouth.   Lancet Device Misc 1 each by Does not apply route in the morning, at noon, and at bedtime. May substitute to any manufacturer covered by patient's insurance.   Lancets Misc. Misc 1 each by Does not  apply route in the morning, at noon, and at bedtime. May substitute to any manufacturer covered by patient's insurance.   levothyroxine 88 MCG tablet Commonly known as: SYNTHROID Take 88 mcg by mouth daily before breakfast. What changed: Another medication with the same name was removed. Continue taking this medication, and follow the directions you see here.   lisinopril 30 MG tablet Commonly known as: ZESTRIL Take 1 tablet by mouth daily. What changed: Another medication with the same name was removed. Continue taking this medication, and follow the directions you see here.   metFORMIN 500 MG 24 hr tablet Commonly known as: GLUCOPHAGE-XR Take 1 tablet (500 mg total) by  mouth 2 (two) times daily with a meal.   nystatin 100000 UNIT/ML suspension Commonly known as: MYCOSTATIN Take 5 mLs by mouth 4 (four) times daily. 10 DAY COURSE STARTING ON 10/12/22 AND TO BE COMPLETED ON 10/22/2022   omeprazole 20 MG capsule Commonly known as: PRILOSEC Take 1 capsule by mouth daily.   ondansetron 4 MG tablet Commonly known as: ZOFRAN Take 1 tablet (4 mg total) by mouth every 6 (six) hours as needed for nausea.   oxyCODONE 5 MG immediate release tablet Commonly known as: Oxy IR/ROXICODONE Take 1 tablet (5 mg total) by mouth every 4 (four) hours as needed for severe pain or breakthrough pain.   pravastatin 40 MG tablet Commonly known as: PRAVACHOL Take 40 mg by mouth daily.   psyllium 0.52 g capsule Commonly known as: REGULOID Take 0.52 g by mouth at bedtime.   ramelteon 8 MG tablet Commonly known as: ROZEREM Take 8 mg by mouth at bedtime.   tiZANidine 4 MG tablet Commonly known as: ZANAFLEX Take 4 mg by mouth every 8 (eight) hours as needed for muscle spasms.   topiramate 50 MG tablet Commonly known as: TOPAMAX Take by mouth.   Trelegy Ellipta 200-62.5-25 MCG/ACT Aepb Generic drug: Fluticasone-Umeclidin-Vilant Inhale 1 puff into the lungs daily. *RINSE MOUTH WITH WATER AND SPIT DAILY   V-R PAIN RELIEVING Gel Apply 1 Application topically 2 (two) times daily after a meal. Applied to lower back as directed   venlafaxine XR 75 MG 24 hr capsule Commonly known as: EFFEXOR-XR Take by mouth. What changed: Another medication with the same name was removed. Continue taking this medication, and follow the directions you see here.   zolpidem 5 MG tablet Commonly known as: AMBIEN Take 5 mg by mouth at bedtime as needed. What changed: Another medication with the same name was removed. Continue taking this medication, and follow the directions you see here.               Discharge Care Instructions  (From admission, onward)           Start      Ordered   10/29/22 0000  Leave dressing on - Keep it clean, dry, and intact until clinic visit        10/29/22 0955            Follow-up Information     Catalina Lunger, DO. Schedule an appointment as soon as possible for a visit in 1 week(s).   Specialty: Family Medicine Contact information: [address] [place] [address] [phone]                Discharge Exam: Ceasar Mons Weights   10/26/22 2215 10/27/22 1018  Weight: 72.6 kg 71.3 kg   General.  Well-developed lady, in no acute distress. Pulmonary.  Lungs clear bilaterally, normal  respiratory effort. CV.  Regular rate and rhythm, no JVD, rub or murmur. Abdomen.  Soft, nontender, nondistended, BS positive.  RUQ drain in place. CNS.  Alert and oriented .  No focal neurologic deficit. Extremities.  No edema, no cyanosis, pulses intact and symmetrical. Psychiatry.  Judgment and insight appears normal.   Condition at discharge: stable  The results of significant diagnostics from this hospitalization (including imaging, microbiology, ancillary and laboratory) are listed below for reference.   Imaging Studies: CT GUIDED PERITONEAL/RETROPERITONEAL FLUID DRAIN BY PERC CATH  Result Date: 10/28/2022 INDICATION: 67 year old female with biloma referred for drainage EXAM: CT-GUIDED ABDOMINAL DRAINAGE TECHNIQUE: Multidetector CT imaging of the abdomen was performed following the standard protocol without IV contrast. RADIATION DOSE REDUCTION: This exam was performed according to the departmental dose-optimization program which includes automated exposure control, adjustment of the mA and/or kV according to patient size and/or use of iterative reconstruction technique. MEDICATIONS: The patient is currently admitted to the hospital and receiving intravenous antibiotics. The antibiotics were administered within an appropriate time frame prior to the initiation of the procedure. ANESTHESIA/SEDATION: Moderate (conscious) sedation was  employed during this procedure. A total of Versed 1.5 mg and Fentanyl 100 mcg was administered intravenously by the radiology nurse. Total intra-service moderate Sedation Time: 15 minutes. The patient's level of consciousness and vital signs were monitored continuously by radiology nursing throughout the procedure under my direct supervision. COMPLICATIONS: None PROCEDURE: Informed written consent was obtained from the patient after a thorough discussion of the procedural risks, benefits and alternatives. All questions were addressed. Maximal Sterile Barrier Technique was utilized including caps, mask, sterile gowns, sterile gloves, sterile drape, hand hygiene and skin antiseptic. A timeout was performed prior to the initiation of the procedure. Patient was positioned supine on the CT gantry table. Scout CT was acquired for planning purposes. The patient is prepped and draped in the usual sterile fashion. 1% lidocaine was used for local anesthesia. Using CT guidance, Yueh needle was advanced into the biloma of the right upper quadrant. Once we confirmed needle tip position modified Seldinger technique was used to place a 12 [place] drain. Spontaneous the drainage clear bile was encountered under pressure. CT was acquired. Drain was repositioned in a more superficial location near the margin of the liver. Repeat CT was acquired. The drain was then sutured in position and attached to gravity bag. Proximally 100 cc bile was vacuum weighted. Sample was sent for culture. Patient tolerated the procedure well and remained hemodynamically stable throughout. No complications were encountered and no significant blood loss. IMPRESSION: Status post CT-guided drainage of right upper quadrant biloma. Signed, Yvone Neu. Miachel Roux, RPVI [facility] Electronically Signed   By: Gilmer Mor D.O.   On: 10/28/2022 13:48   NM HEPATOBILIARY LEAK (POST-SURGICAL)  Result  Date: 10/27/2022 CLINICAL DATA:  Postoperative abdominal pain, perihepatic fluid collection by CT question bile leak EXAM: NUCLEAR MEDICINE HEPATOBILIARY IMAGING TECHNIQUE: Sequential images of the abdomen were obtained out to 60 minutes following intravenous administration of radiopharmaceutical. RADIOPHARMACEUTICALS:  5.5 mCi Tc-33m  Choletec IV COMPARISON:  CT abdomen and pelvis 10/27/2022 FINDINGS: Normal tracer extraction from bloodstream indicating normal hepatocellular function. Prompt excretion of tracer into biliary tree. Small bowel visualized at 14 minutes. Gallbladder surgically absent. Beginning at 11 minutes, abnormal tracer accumulates at the lateral margin of the liver corresponding to fluid collection on CT consistent with bile leak. IMPRESSION: Post cholecystectomy. Patent biliary tree with tracer into small bowel. Abnormal localization  of tracer within fluid collection lateral to the RIGHT lobe of the liver consistent with bile leak. Findings called to Dr. Lovell Sheehan on 10/27/2022 at 1130 hours. Electronically Signed   By: Ulyses Southward M.D.   On: 10/27/2022 11:44   DG Chest Port 1 View  Result Date: 10/27/2022 CLINICAL DATA:  Shortness of breath, vomiting, fever, recent cholecystectomy EXAM: PORTABLE CHEST 1 VIEW COMPARISON:  10/17/2022 FINDINGS: Cardiac size is within normal limits. There are no signs of alveolar pulmonary edema. There is interval appearance of small right pleural effusion. Transverse linear density in right parahilar region may suggest subsegmental atelectasis. There is no pneumothorax. Degenerative changes are noted in left shoulder. IMPRESSION: There are no signs of alveolar pulmonary edema or focal pulmonary consolidation. Small right pleural effusion. Linear density in right parahilar region may suggest subsegmental atelectasis. Electronically Signed   By: Ernie Avena M.D.   On: 10/27/2022 09:24   CT ABDOMEN PELVIS W CONTRAST  Result Date: 10/27/2022 CLINICAL  DATA:  Postoperative abdominal pain. EXAM: CT ABDOMEN AND PELVIS WITH CONTRAST TECHNIQUE: Multidetector CT imaging of the abdomen and pelvis was performed using the standard protocol following bolus administration of intravenous contrast. RADIATION DOSE REDUCTION: This exam was performed according to the departmental dose-optimization program which includes automated exposure control, adjustment of the mA and/or kV according to patient size and/or use of iterative reconstruction technique. CONTRAST:  OMNIPAQUE IOHEXOL 300 MG/ML  SOLN COMPARISON:  October 17, 2022 FINDINGS: Lower chest: Mild to moderate severity areas of atelectasis are seen within the bilateral lung bases, right greater than left. A small right pleural effusion is noted. Hepatobiliary: A stable subcentimeter cyst is seen within the anterior aspect of the left lobe of the liver. Status post cholecystectomy. A moderate to marked amount of fluid and a small amount of air is seen within the gallbladder fossa. This extends along the anterior and lateral aspect of the right lobe of the liver. A 7.3 cm x 6.2 cm x 4.5 cm well-defined collection of fluid is also seen inferior to the right lobe of the liver (axial CT images 30 through 41, CT series 2). This a appears to connect to the gallbladder fossa (best seen on coronal reformatted images 37 through 40, CT series 6). No postoperative abscesses are identified. The common bile duct measures 12 mm in diameter. Pancreas: Unremarkable. No pancreatic ductal dilatation or surrounding inflammatory changes. Spleen: Normal in size without focal abnormality. Adrenals/Urinary Tract: Adrenal glands are unremarkable. Kidneys are normal, without renal calculi, focal lesion, or hydronephrosis. Bladder is unremarkable. Stomach/Bowel: Stomach is within normal limits. Appendix appears normal. No evidence of bowel wall thickening, distention, or inflammatory changes. Noninflamed diverticula are seen throughout the  proximal to mid sigmoid colon. Vascular/Lymphatic: Aortic atherosclerosis. No enlarged abdominal or pelvic lymph nodes. Reproductive: Uterus and bilateral adnexa are unremarkable. Other: No abdominal wall hernia or abnormality. No abdominopelvic ascites. Musculoskeletal: Multilevel degenerative changes seen throughout the lumbar spine. IMPRESSION: 1. Status post cholecystectomy with a marked amount of postoperative perihepatic and right upper quadrant fluid, as described above. 2. Sigmoid diverticulosis. 3. Mild to moderate severity bilateral basilar atelectasis, right greater than left. 4. Small right pleural effusion. 5. Aortic atherosclerosis. Aortic Atherosclerosis (ICD10-I70.0). Electronically Signed   By: Aram Candela M.D.   On: 10/27/2022 00:23   DG Cholangiogram Operative  Result Date: 10/19/2022 CLINICAL DATA:  Intraoperative cholangiogram during laparoscopic cholecystectomy. EXAM: INTRAOPERATIVE CHOLANGIOGRAM FLUOROSCOPY TIME:  1 minute, 5 seconds COMPARISON:  Right upper quadrant abdominal ultrasound-10/17/2022; CT  abdomen pelvis-10/17/2022 FINDINGS: Intraoperative cholangiographic images of the right upper abdominal quadrant during laparoscopic cholecystectomy are provided for review. Surgical clips overlie the expected location of the gallbladder fossa. Contrast injection demonstrates selective cannulation of the central aspect of the cystic duct. There is passage of contrast through the central aspect of the cystic duct with filling of a mildly dilated common bile duct. There is passage of contrast though the CBD and into the descending portion of the duodenum. There is minimal reflux of injected contrast into the common hepatic duct and central aspect of the non dilated intrahepatic biliary system. There are no discrete filling defects within the opacified portions of the biliary system to suggest the presence of choledocholithiasis. IMPRESSION: No evidence of choledocholithiasis.  Electronically Signed   By: Simonne Come M.D.   On: 10/19/2022 15:22   US Abdomen Limited RUQ (LIVER/GB)  Result Date: 10/17/2022 CLINICAL DATA:  Acute right upper quadrant abdominal pain. EXAM: ULTRASOUND ABDOMEN LIMITED RIGHT UPPER QUADRANT COMPARISON:  CT scan of same day. FINDINGS: Gallbladder: Multiple gallstones are noted, with 1 noted in the neck of the gallbladder. Severe gallbladder wall thickening is noted measuring 12 mm at 1 point. No sonographic Murphy's sign is noted. Common bile duct: Diameter: 8 mm which is mildly dilated. Liver: No focal lesion identified. Within normal limits in parenchymal echogenicity. Portal vein is patent on color Doppler imaging with normal direction of blood flow towards the liver. Other: None. IMPRESSION: Multiple gallstones are noted with at least 1 calculus noted in the neck of the gallbladder. Severe gallbladder wall thickening is noted. These findings are concerning for possible cholecystitis. Mild common bile duct dilatation is noted as described on CT scan of same day. Electronically Signed   By: Lupita Raider M.D.   On: 10/17/2022 12:49   CT ABDOMEN PELVIS W CONTRAST  Result Date: 10/17/2022 CLINICAL DATA:  Acute abdominal pain.  Worsening abdominal pain. EXAM: CT ABDOMEN AND PELVIS WITH CONTRAST TECHNIQUE: Multidetector CT imaging of the abdomen and pelvis was performed using the standard protocol following bolus administration of intravenous contrast. RADIATION DOSE REDUCTION: This exam was performed according to the departmental dose-optimization program which includes automated exposure control, adjustment of the mA and/or kV according to patient size and/or use of iterative reconstruction technique. CONTRAST:  OMNIPAQUE IOHEXOL 300 MG/ML  SOLN COMPARISON:  None Available. FINDINGS: Lower chest: No acute abnormality. Questionable mass within the lower RIGHT breast, measuring approximately 1 cm greatest dimension (series 2, image 16). Hepatobiliary:  No acute or suspicious findings within the liver. Sludge and/or stones are present within the nondistended gallbladder. Diffuse enhancement of the gallbladder walls. No pericholecystic fluid. Common bile duct measures approximately 1 cm diameter. No stone is identified within the common bile duct. Pancreas: Unremarkable. No pancreatic ductal dilatation or surrounding inflammatory changes. Spleen: Normal in size without focal abnormality. Adrenals/Urinary Tract: Adrenal glands appear normal. Kidneys are unremarkable without suspicious mass, stone or hydronephrosis. No ureteral or bladder calculi are identified. Bladder walls are circumferentially thickened, possibly accentuated to some degree by incomplete bladder distention. Stomach/Bowel: No dilated large or small bowel loops. No evidence of bowel wall inflammation. Appendix appears normal. Scattered diverticulosis of the sigmoid colon but no focal inflammatory changes seen to suggest acute diverticulitis. Stomach is unremarkable, although partially decompressed limiting characterization. Vascular/Lymphatic: Aortic atherosclerosis. No acute-appearing vascular abnormality. Mildly prominent lymph nodes within the porta hepatis and portacaval spaces. No enlarged lymph nodes identified elsewhere in the abdomen or pelvis. Reproductive: Uterus and bilateral  adnexa are unremarkable. Other: No free fluid or abscess collection is seen. No free intraperitoneal air. Musculoskeletal: Degenerative spondylosis of the slightly scoliotic thoracolumbar spine. No acute-appearing osseous abnormality. IMPRESSION: 1. Sludge and/or stones within the nondistended gallbladder. Diffuse enhancement of the gallbladder walls, but no pericholecystic fluid. Recommend right upper quadrant ultrasound to evaluate for acute cholecystitis. 2. Common bile duct measures approximately 1 cm diameter. No stone is identified within the common bile duct. Recommend correlation with LFTs. If LFTs are  elevated, would recommend MRCP for further characterization. This CBD dilatation can also be further characterized on the RIGHT upper quadrant ultrasound recommended above. 3. Bladder walls are circumferentially thickened, possibly accentuated to some degree by incomplete bladder distention. Recommend correlation with urinalysis to exclude cystitis. 4. Colonic diverticulosis without evidence of acute diverticulitis. 5. Questionable mass within the lower RIGHT breast, measuring approximately 1 cm greatest dimension. Recommend nonemergent diagnostic mammogram at a [facility] Center for further characterization. 6. Mildly prominent lymph nodes within the porta hepatis and portacaval spaces, most likely reactive in nature. Aortic Atherosclerosis (ICD10-I70.0). Electronically Signed   By: Bary Richard M.D.   On: 10/17/2022 10:07   DG Abdomen Acute W/Chest  Result Date: 10/17/2022 CLINICAL DATA:  Acute abdominal pain. EXAM: DG ABDOMEN ACUTE WITH 1 VIEW CHEST COMPARISON:  None Available. FINDINGS: There is no evidence of dilated bowel loops or free intraperitoneal air. No radiopaque calculi or other significant radiographic abnormality is seen. Heart size and mediastinal contours are within normal limits. Both lungs are clear. IMPRESSION: No abnormal bowel dilatation.  No acute cardiopulmonary disease. Electronically Signed   By: Lupita Raider M.D.   On: 10/17/2022 09:12    Microbiology: Results for orders placed or performed during the hospital encounter of 10/26/22  Aerobic/Anaerobic Culture w Gram Stain (surgical/deep wound)     Status: None (Preliminary result)   Collection Time: 10/28/22 10:58 AM   Specimen: BILE; Abscess  Result Value Ref Range Status   Specimen Description BILE  Final   Special Requests NONE  Final   Gram Stain NO ORGANISMS SEEN NO WBC SEEN   Final   Culture   Final    NO GROWTH < 24 HOURS Performed at [facility], [address], [place], [place] [address]    Report  Status PENDING  Incomplete    Labs: CBC: Recent Labs  Lab 10/26/22 2020 10/28/22 0421  WBC 6.3 6.4  HGB 9.4* 10.4*  HCT 28.3* 30.8*  MCV 95.9 93.6  PLT 243 298   Basic Metabolic Panel: Recent Labs  Lab 10/26/22 2020 10/28/22 0421  NA 134* 139  K 3.5 4.4  CL 101 100  CO2 24 27  GLUCOSE 229* 125*  BUN 7* 5*  CREATININE 0.94 0.91  CALCIUM 7.9* 8.2*   Liver Function Tests: Recent Labs  Lab 10/26/22 2020 10/28/22 0421  AST 15 12*  ALT 20 19  ALKPHOS 98 108  BILITOT 0.4 0.5  PROT 6.1* 5.7*  ALBUMIN 2.7* 2.3*   CBG: Recent Labs  Lab 10/28/22 1657 10/28/22 2036 10/29/22 0738  GLUCAP 233* 78 110*    Discharge time spent: greater than 30 minutes.  This record has been created using Conservation officer, historic buildings. Errors have been sought and corrected,but may not always be located. Such creation errors do not reflect on the standard of care.   Signed: Arnetha Courser, MD [facility] 10/29/2022

## 2022-10-30 ENCOUNTER — Encounter (HOSPITAL_COMMUNITY)
Admission: EM | Disposition: A | Discharge status: Home / Self Care | Payer: Self-pay | Source: Home / Self Care | Attending: Internal Medicine

## 2022-10-30 ENCOUNTER — Encounter (HOSPITAL_COMMUNITY): Payer: Self-pay | Admitting: Internal Medicine

## 2022-10-30 ENCOUNTER — Inpatient Hospital Stay (HOSPITAL_COMMUNITY): Payer: 59 | Admitting: Anesthesiology

## 2022-10-30 ENCOUNTER — Inpatient Hospital Stay (HOSPITAL_COMMUNITY): Payer: 59

## 2022-10-30 DIAGNOSIS — E119 Type 2 diabetes mellitus without complications: Secondary | ICD-10-CM | POA: Diagnosis not present

## 2022-10-30 DIAGNOSIS — E039 Hypothyroidism, unspecified: Secondary | ICD-10-CM

## 2022-10-30 DIAGNOSIS — K838 Other specified diseases of biliary tract: Secondary | ICD-10-CM

## 2022-10-30 DIAGNOSIS — F17210 Nicotine dependence, cigarettes, uncomplicated: Secondary | ICD-10-CM | POA: Diagnosis not present

## 2022-10-30 DIAGNOSIS — K5900 Constipation, unspecified: Secondary | ICD-10-CM

## 2022-10-30 DIAGNOSIS — I10 Essential (primary) hypertension: Secondary | ICD-10-CM

## 2022-10-30 DIAGNOSIS — R1011 Right upper quadrant pain: Secondary | ICD-10-CM | POA: Diagnosis not present

## 2022-10-30 DIAGNOSIS — D638 Anemia in other chronic diseases classified elsewhere: Secondary | ICD-10-CM

## 2022-10-30 DIAGNOSIS — K9189 Other postprocedural complications and disorders of digestive system: Secondary | ICD-10-CM | POA: Diagnosis not present

## 2022-10-30 DIAGNOSIS — N6310 Unspecified lump in the right breast, unspecified quadrant: Secondary | ICD-10-CM

## 2022-10-30 HISTORY — PX: ENDOSCOPIC RETROGRADE CHOLANGIOPANCREATOGRAPHY (ERCP) WITH PROPOFOL: SHX5810

## 2022-10-30 HISTORY — PX: BILIARY STENT PLACEMENT: SHX5538

## 2022-10-30 LAB — AEROBIC/ANAEROBIC CULTURE W GRAM STAIN (SURGICAL/DEEP WOUND)

## 2022-10-30 LAB — GLUCOSE, CAPILLARY
Glucose-Capillary: 157 mg/dL — ABNORMAL HIGH (ref 70–99)
Glucose-Capillary: 113 mg/dL — ABNORMAL HIGH (ref 70–99)
Glucose-Capillary: 140 mg/dL — ABNORMAL HIGH (ref 70–99)
Glucose-Capillary: 328 mg/dL — ABNORMAL HIGH (ref 70–99)

## 2022-10-30 SURGERY — ENDOSCOPIC RETROGRADE CHOLANGIOPANCREATOGRAPHY (ERCP) WITH PROPOFOL
Anesthesia: General

## 2022-10-30 MED ORDER — LIDOCAINE 2% (20 MG/ML) 5 ML SYRINGE
INTRAMUSCULAR | Status: DC | PRN
  Administered 2022-10-30: 100 mg via INTRAVENOUS

## 2022-10-30 MED ORDER — GLUCAGON HCL RDNA (DIAGNOSTIC) 1 MG IJ SOLR
INTRAMUSCULAR | Status: AC
  Filled 2022-10-30: qty 1

## 2022-10-30 MED ORDER — LACTATED RINGERS IV SOLN: INTRAVENOUS | Status: DC | PRN

## 2022-10-30 MED ORDER — ROCURONIUM BROMIDE 10 MG/ML (PF) SYRINGE
PREFILLED_SYRINGE | INTRAVENOUS | Status: DC | PRN
  Administered 2022-10-30: 60 mg via INTRAVENOUS

## 2022-10-30 MED ORDER — FENTANYL CITRATE (PF) 250 MCG/5ML IJ SOLN
INTRAMUSCULAR | Status: AC
  Filled 2022-10-30: qty 5

## 2022-10-30 MED ORDER — INDOMETHACIN 50 MG RE SUPP: 100.0000 mg | Freq: Once | RECTAL | Status: DC

## 2022-10-30 MED ORDER — DICLOFENAC SUPPOSITORY 100 MG
RECTAL | Status: DC | PRN
  Administered 2022-10-30: 100 mg via RECTAL

## 2022-10-30 MED ORDER — INSULIN ASPART 100 UNIT/ML IJ SOLN
0.0000 [IU] | Freq: Three times a day (TID) | INTRAMUSCULAR | Status: DC
  Administered 2022-10-31 – 2022-11-01 (×3): 1 [IU] via SUBCUTANEOUS

## 2022-10-30 MED ORDER — FENTANYL CITRATE (PF) 250 MCG/5ML IJ SOLN
INTRAMUSCULAR | Status: DC | PRN
  Administered 2022-10-30 (×2): 25 ug via INTRAVENOUS

## 2022-10-30 MED ORDER — MIDAZOLAM HCL 5 MG/5ML IJ SOLN
INTRAMUSCULAR | Status: DC | PRN
  Administered 2022-10-30: 1 mg via INTRAVENOUS

## 2022-10-30 MED ORDER — LIVING WELL WITH DIABETES BOOK
Freq: Once | Status: AC
  Filled 2022-10-30: qty 1

## 2022-10-30 MED ORDER — HYDRALAZINE HCL 20 MG/ML IJ SOLN
10.0000 mg | INTRAMUSCULAR | Status: DC | PRN
  Administered 2022-10-31: 10 mg via INTRAVENOUS
  Filled 2022-10-30: qty 1

## 2022-10-30 MED ORDER — INSULIN ASPART 100 UNIT/ML IJ SOLN
0.0000 [IU] | Freq: Every day | INTRAMUSCULAR | Status: DC
  Administered 2022-10-30: 4 [IU] via SUBCUTANEOUS

## 2022-10-30 MED ORDER — MIDAZOLAM HCL 2 MG/2ML IJ SOLN
INTRAMUSCULAR | Status: AC
  Filled 2022-10-30: qty 2

## 2022-10-30 MED ORDER — PROPOFOL 10 MG/ML IV BOLUS
INTRAVENOUS | Status: DC | PRN
  Administered 2022-10-30: 100 mg via INTRAVENOUS

## 2022-10-30 MED ORDER — SODIUM CHLORIDE 0.9 % IV SOLN
INTRAVENOUS | Status: DC | PRN
  Administered 2022-10-30: 15 mL

## 2022-10-30 MED ORDER — DEXAMETHASONE SODIUM PHOSPHATE 10 MG/ML IJ SOLN
INTRAMUSCULAR | Status: DC | PRN
  Administered 2022-10-30: 10 mg via INTRAVENOUS

## 2022-10-30 MED ORDER — DICLOFENAC SUPPOSITORY 100 MG
RECTAL | Status: AC
  Filled 2022-10-30: qty 1

## 2022-10-30 MED ORDER — SUGAMMADEX SODIUM 200 MG/2ML IV SOLN
INTRAVENOUS | Status: DC | PRN
  Administered 2022-10-30: 200 mg via INTRAVENOUS

## 2022-10-30 MED ORDER — LACTATED RINGERS IV SOLN: Freq: Once | INTRAVENOUS | Status: AC

## 2022-10-30 MED ORDER — ONDANSETRON HCL 4 MG/2ML IJ SOLN
INTRAMUSCULAR | Status: DC | PRN
  Administered 2022-10-30: 4 mg via INTRAVENOUS

## 2022-10-30 NOTE — H&P (View-Only) (Signed)
Progress Note   Patient: Kristin [name]:[id number] DOB: 1956-05-26 DOA: 10/26/2022     3 DOS: the patient was seen and examined on 10/30/2022   Brief hospital course:  Kristin [name] is a 67 y.o. female with medical history significant of COPD, hypertension, hypothyroidism, dyslipidemia, anxiety and recent laparoscopic cholecystectomy 1 week ago presented to [facility], ED with worsening abdominal pain.  In the ED patient was hemodynamically stable.  CT scan of the abdomen and pelvis showed atelectasis within the bilateral lung bases with a small right pleural effusion. She was also found to have a fluid collection in the gallbladder fossa measuring 7.3 x 6.2 x 4.5 cm this area is well-defined. Common bile duct 12 mm.  General surgery was consulted and for concern of bile leak versus hematoma, HIDA scan was ordered.  HIDA scan was positive for biliary leak and patient was transferred to [facility] for IR evaluation and drain placement.  Patient had biliary drain placed in.  Preliminary cultures negative so far.   GI on board and plan for ERCP.  Assessment and Plan: * Abdominal pain/ Biliary leak.  History of laparoscopic cholecystectomy 1 week ago.  HIDA scan for biliary leak s/p drain placement by IR.  Plan for ERCP with stent today.  Preliminary cultures negative so far.  Continue Unasyn.  Continue pain management.  Denies much pain today.  HTN (hypertension) Continue clonidine and lisinopril.  Blood pressure seems to be elevated.  Diabetes mellitus (HCC) New diagnosis.  Hemoglobin A1c of 7.2 and hyperglycemia.  On sliding scale insulin.  Will likely need oral hypoglycemics on discharge.  Will need PCP on discharge.  Diabetic coordinator on board.  Constipation Continue bowel regimen.  Hypothyroidism Continue Synthroid.  Breast mass, right-Needs mammogram Incidental finding during previous hospitalization. Will need mammogram outpatient after discharge   Subjective:   Today, patient was seen and examined at bedside.  Wants to go home.  Status post biliary drain.  Denies any nausea vomiting or abdominal pain.  Awaiting for ERCP.    Physical Exam: Vitals:   10/29/22 2010 10/29/22 2012 10/30/22 0521 10/30/22 0738  BP: (!) 171/65 (!) 167/67 (!) 161/74 (!) 187/66  Pulse: 71 72 62 63  Resp: 14 13  17   Temp: 98.4 F (36.9 C) 98.3 F (36.8 C) 98.4 F (36.9 C) 98.6 F (37 C)  TempSrc: Oral Oral Oral   SpO2: 96% 100%  96%  Weight:      Height:       Body mass index is 27.84 kg/m.  General:  Average built, not in obvious distress HENT:   No scleral pallor or icterus noted. Oral mucosa is moist.  Chest:  Clear breath sounds.  Diminished breath sounds bilaterally. No crackles or wheezes.  CVS: S1 &S2 heard. No murmur.  Regular rate and rhythm. Abdomen: Soft, nontender, nondistended.  Bowel sounds are heard.  Upper quadrant drain in place. Extremities: No cyanosis, clubbing or edema.  Peripheral pulses are palpable. Psych: Alert, awake and oriented, normal mood CNS:  No cranial nerve deficits.  Power equal in all extremities.   Skin: Warm and dry.  No rashes noted.  Data Reviewed:  Labs and imaging studies reviewed.  Family Communication: Discussed with patient at bedside.  Consult  IR GI.  Procedure. IR guided biliary drain placement.  Disposition: ALF in 1 to 2 days. Status is: Inpatient  Remains inpatient appropriate because: Status post biliary leak, awaiting for ERCP and stent placement.   Planned  Discharge Destination: Assisted living facility.   Author: Joycelyn Das, MD 10/30/2022 8:39 AM  For on call review [email or web address].

## 2022-10-30 NOTE — TOC Initial Note (Signed)
Transition of Care [facility]) - Initial/Assessment Note    Patient Details  Name: Kristin [name] MRN: [id number] Date of Birth: 12-Mar-1956  Transition of Care [facility]) CM/SW Contact:    Kerin Cecchi A [place], Theresia Majors Phone Number: 10/30/2022, 4:13 PM  Clinical Narrative:                 CSW met with pt at bedside. Pt wants to return to her assisted living facilty at the [facility]. She states that she does not have family involvement and that they cannot assist with transportation at discharge.CSW contacted pt's son with contact information listed on pt's chart. Person answering the phone stated that CSW had the wrong number. CSW has not been able to confirm contact information with pt. CSW will follow up regarding corrected number. Transportation needs at discharge.   Expected Discharge Plan: Assisted Living Barriers to Discharge: Continued Medical Work up , Transportation barriers at discharge   Patient Goals and CMS Choice Patient states their goals for this hospitalization and ongoing recovery are:: Pt states that she wants to return to her assisted living facility as soon as possible CMS Medicare.gov Compare Post Acute Care list provided to:: Patient Choice offered to / list presented to : Patient      Expected Discharge Plan and Services In-house Referral: Clinical Social Work Discharge Planning Services: CM Consult   Living arrangements for the past 2 months: Assisted Living Facility Expected Discharge Date: 10/29/22                                    Prior Living Arrangements/Services Living arrangements for the past 2 months: Assisted Living Facility Lives with:: Facility Resident Patient language and need for interpreter reviewed:: Yes Do you feel safe going back to the place where you live?: Yes      Need for Family Participation in Patient Care: No (Comment) Care giver support system in place?: No (comment)   Criminal Activity/Legal Involvement Pertinent to  Current Situation/Hospitalization: No - Comment as needed  Activities of Daily Living Home Assistive Devices/Equipment: None ADL Screening (condition at time of admission) Patient's cognitive ability adequate to safely complete daily activities?: Yes Is the patient deaf or have difficulty hearing?: No Does the patient have difficulty seeing, even when wearing glasses/contacts?: No Does the patient have difficulty concentrating, remembering, or making decisions?: No Patient able to express need for assistance with ADLs?: Yes Does the patient have difficulty dressing or bathing?: No Independently performs ADLs?: Yes (appropriate for developmental age) Does the patient have difficulty walking or climbing stairs?: No Weakness of Legs: None Weakness of Arms/Hands: None  Permission Sought/Granted                  Emotional Assessment Appearance:: Appears older than stated age Attitude/Demeanor/Rapport: Engaged Affect (typically observed): Restless Orientation: : Oriented to Self, Oriented to Place, Oriented to  Time, Oriented to Situation Alcohol / Substance Use: Tobacco Use Psych Involvement: No (comment)  Admission diagnosis:  Post-operative pain [G89.18] Abdominal pain [R10.9] Fluid collection at surgical site, initial encounter [T88.8XXA] Patient Active Problem List   Diagnosis Date Noted   Bile leak, postoperative 10/29/2022   Diabetes mellitus (HCC) 10/28/2022   Fluid collection at surgical site 10/28/2022   Abdominal pain 10/27/2022   Nodular hyperplasia of liver 10/19/2022   Gallstone pancreatitis 10/18/2022   Breast mass, right-Needs mammogram 10/17/2022   Cholelithiasis and acute cholecystitis without obstruction  10/17/2022   Hypothyroidism 10/17/2022   COPD without exacerbation (HCC) 10/17/2022   Tobacco abuse 10/17/2022   HTN (hypertension) 10/17/2022   Constipation 10/17/2022   HLD (hyperlipidemia) 10/17/2022   PCP:  Catalina Lunger, DO Pharmacy:   [facility] - [place], [place] - [address] [place] [address] Phone: [phone] Fax: [phone]  [facility] - [place], [place] - [address] [place] [address] Phone: [phone] Fax: [phone]     Social Determinants of Health (SDOH) Social History: SDOH Screenings   Food Insecurity: No Food Insecurity (10/27/2022)  Housing: Low Risk  (10/27/2022)  Transportation Needs: No Transportation Needs (10/27/2022)  Utilities: Not At Risk (10/27/2022)  Tobacco Use: High Risk (10/30/2022)   SDOH Interventions:     Readmission Risk Interventions     No data to display

## 2022-10-30 NOTE — Care Management Important Message (Signed)
Important Message  Patient Details  Name: DORALYN KIRKES MRN: [id number] Date of Birth: [birth year]   Medicare Important Message Given:  Yes     Graeson Nouri Stefan Church 10/30/2022, 3:04 PM

## 2022-10-30 NOTE — Anesthesia Procedure Notes (Signed)
Procedure Name: Intubation Date/Time: 10/30/2022 3:55 PM  Performed by: Marena Chancy, CRNAPre-anesthesia Checklist: Patient identified, Emergency Drugs available, Suction available and Patient being monitored Patient Re-evaluated:Patient Re-evaluated prior to induction Oxygen Delivery Method: Circle System Utilized Preoxygenation: Pre-oxygenation with 100% oxygen Induction Type: IV induction and Cricoid Pressure applied Ventilation: Mask ventilation without difficulty Laryngoscope Size: Miller and 2 Grade View: Grade II Tube type: Oral Tube size: 7.0 mm Number of attempts: 1 Airway Equipment and Method: Stylet and Oral airway Placement Confirmation: ETT inserted through vocal cords under direct vision, positive ETCO2 and breath sounds checked- equal and bilateral Tube secured with: Tape Dental Injury: Teeth and Oropharynx as per pre-operative assessment

## 2022-10-30 NOTE — Anesthesia Preprocedure Evaluation (Addendum)
Anesthesia Evaluation  Patient identified by MRN, date of birth, ID band Patient awake    Reviewed: Allergy & Precautions, NPO status , Patient's Chart, lab work & pertinent test results  History of Anesthesia Complications Negative for: history of anesthetic complications  Airway Mallampati: III  TM Distance: >3 FB Neck ROM: Full    Dental  (+) Dental Advisory Given, Missing,    Pulmonary neg shortness of breath, COPD,  COPD inhaler, neg recent URI, Current Smoker and Patient abstained from smoking.   Pulmonary exam normal        Cardiovascular hypertension, Pt. on medications (-) angina (-) Past MI and (-) CHF Normal cardiovascular exam     Neuro/Psych  PSYCHIATRIC DISORDERS Anxiety     negative neurological ROS     GI/Hepatic ,GERD  ,,Bile leak post cholecystectomy   Endo/Other  diabetesHypothyroidism    Renal/GU      Musculoskeletal   Abdominal   Peds  Hematology  (+) Blood dyscrasia, anemia Lab Results      Component                Value               Date                      WBC                      6.4                 10/28/2022                HGB                      10.4 (L)            10/28/2022                HCT                      30.8 (L)            10/28/2022                MCV                      93.6                10/28/2022                PLT                      298                 10/28/2022              Anesthesia Other Findings   Reproductive/Obstetrics                             Anesthesia Physical Anesthesia Plan  ASA: 2  Anesthesia Plan: General   Post-op Pain Management: Minimal or no pain anticipated   Induction: Intravenous  PONV Risk Score and Plan: 2 and Ondansetron and Dexamethasone  Airway Management Planned: Oral ETT  Additional Equipment: None  Intra-op Plan:   Post-operative Plan: Extubation in OR  Informed Consent: I have  reviewed the patients History and Physical, chart, labs and discussed  the procedure including the risks, benefits and alternatives for the proposed anesthesia with the patient or authorized representative who has indicated his/her understanding and acceptance.     Dental advisory given  Plan Discussed with: CRNA  Anesthesia Plan Comments:        Anesthesia Quick Evaluation

## 2022-10-30 NOTE — TOC Progression Note (Signed)
Transition of Care [facility]) - Progression Note    Patient Details  Name: Kristin [name] MRN: [id number] Date of Birth: 26-Jul-1955  Transition of Care [facility]) CM/SW Contact  Janae Bridgeman, RN Phone Number: 10/30/2022, 2:23 PM  Clinical Narrative:    CM spoke with the attending MD and patient will likely discharge back to the ALF on Sunday, 11/01/22.  The patient is currently in endoscopy for procedure.   Expected Discharge Plan: Assisted Living Barriers to Discharge: Continued Medical Work up  Expected Discharge Plan and Services In-house Referral: Clinical Social Work Discharge Planning Services: CM Consult   Living arrangements for the past 2 months: Assisted Living Facility Expected Discharge Date: 10/29/22                                     Social Determinants of Health (SDOH) Interventions SDOH Screenings   Food Insecurity: No Food Insecurity (10/27/2022)  Housing: Low Risk  (10/27/2022)  Transportation Needs: No Transportation Needs (10/27/2022)  Utilities: Not At Risk (10/27/2022)  Tobacco Use: High Risk (10/27/2022)    Readmission Risk Interventions     No data to display

## 2022-10-30 NOTE — Inpatient Diabetes Management (Addendum)
Inpatient Diabetes Program Recommendations  AACE/ADA: New Consensus Statement on Inpatient Glycemic Control (2015)  Target Ranges:  Prepandial:   less than 140 mg/dL      Peak postprandial:   less than 180 mg/dL (1-2 hours)      Critically ill patients:  140 - 180 mg/dL   Lab Results  Component Value Date   GLUCAP 157 (H) 10/30/2022   HGBA1C 7.2 (H) 10/27/2022    Review of Glycemic Control  Latest Reference Range & Units 10/29/22 07:38 10/29/22 11:43 10/29/22 16:45 10/29/22 17:06 10/29/22 19:36 10/30/22 07:35  Glucose-Capillary 70 - 99 mg/dL 161 (H) 096 (H) 65 (L) 87 187 (H) 157 (H)   Diabetes history: DM- New diagnosis Outpatient Diabetes medications:   Current orders for Inpatient glycemic control:  Novolog 0-9 units tid with meals and HS Inpatient Diabetes Program Recommendations:    Consider reducing Novolog correction to 0-6 units (very sensitive).  Note new diagnosis of DM.  Will see patient.    Addendum:  spoke with patient at bedside regarding elevated A1C of 7.2%.  She states that he blood sugars have been elevated in the past however she has not been told she has DM.  We briefly discussed importance of close f/u with PCP, basic lifestyle modifications, and potential need for oral medications for DM.  Gave her handout on diet information and will order Living well with DM booklet.  Patient appreciative of information.  Will follow.  Thanks,  Beryl Meager, RN, BC-ADM Inpatient Diabetes Coordinator Pager [phone]  (8a-5p)

## 2022-10-30 NOTE — Anesthesia Postprocedure Evaluation (Signed)
Anesthesia Post Note  Patient: Kristin [name]  Procedure(s) Performed: ENDOSCOPIC RETROGRADE CHOLANGIOPANCREATOGRAPHY (ERCP) WITH PROPOFOL BILIARY STENT PLACEMENT     Patient location during evaluation: PACU Anesthesia Type: General Level of consciousness: sedated Pain management: pain level controlled Vital Signs Assessment: post-procedure vital signs reviewed and stable Respiratory status: spontaneous breathing and respiratory function stable Cardiovascular status: stable Postop Assessment: no apparent nausea or vomiting Anesthetic complications: no   No notable events documented.  Last Vitals:  Vitals:   10/30/22 1710 10/30/22 1720  BP: (!) 189/77 (!) 176/87  Pulse: 63 63  Resp: 18 16  Temp:    SpO2: 93% 94%    Last Pain:  Vitals:   10/30/22 1720  TempSrc:   PainSc: 0-No pain                 Taegan Standage DANIEL

## 2022-10-30 NOTE — [place] FL2 (Signed)
[place] MEDICAID FL2 LEVEL OF CARE FORM     IDENTIFICATION  Patient Name: Kristin [name] Birthdate: 07/25/1955 Sex: female Admission Date (Current Location): 10/26/2022  [facility] and [place] Number:  [employer] and Address:  [facility] [place], [address], [place], [place] [address]      Provider Number: [id number]  Attending Physician Name and Address:  Joycelyn Das, MD  Relative Name and Phone Number:       Current Level of Care: Hospital Recommended Level of Care: Assisted Living Facility Prior Approval Number:    Date Approved/Denied:   PASRR Number:    Discharge Plan: Other (Comment) (Assisted Living Facility)    Current Diagnoses: Patient Active Problem List   Diagnosis Date Noted   Bile leak, postoperative 10/29/2022   Diabetes mellitus (HCC) 10/28/2022   Fluid collection at surgical site 10/28/2022   Abdominal pain 10/27/2022   Nodular hyperplasia of liver 10/19/2022   Gallstone pancreatitis 10/18/2022   Breast mass, right-Needs mammogram 10/17/2022   Cholelithiasis and acute cholecystitis without obstruction 10/17/2022   Hypothyroidism 10/17/2022   COPD without exacerbation (HCC) 10/17/2022   Tobacco abuse 10/17/2022   HTN (hypertension) 10/17/2022   Constipation 10/17/2022   HLD (hyperlipidemia) 10/17/2022    Orientation RESPIRATION BLADDER Height & Weight     Self, Time, Situation, Place  Normal Continent Weight: 157 lb 3 oz (71.3 kg) Height:  5\' 3"  (160 cm)  BEHAVIORAL SYMPTOMS/MOOD NEUROLOGICAL BOWEL NUTRITION STATUS      Continent Diet (low sodium heart healthy)  AMBULATORY STATUS COMMUNICATION OF NEEDS Skin   Independent Verbally Other (Comment) (Bilary Drain with collection bag. (1 tube, right orientation, RLQ. location, size (12). 4 ports abdomen umbilicus mid, upper right, lower left, lower lateral)                       Personal Care Assistance Level of Assistance  Bathing, Feeding, Dressing  Bathing Assistance: Limited assistance Feeding assistance: Limited assistance Dressing Assistance: Limited assistance     Functional Limitations Info    Sight Info: Adequate Hearing Info: Adequate Speech Info: Adequate    SPECIAL CARE FACTORS FREQUENCY                       Contractures Contractures Info: Not present    Additional Factors Info  Code Status, Allergies, Insulin Sliding Scale Code Status Info: FULL Allergies Info: No known allergies Psychotropic Info: venlafaxine XR (EFFEXOR-XR) 24 hr capsule 75 mg  Dose: 75 mg  Freq: Daily with breakfast Route: PO  ,zolpidem (AMBIEN) tablet 5 mg  Dose: 5 mg  Freq: Daily at bedtime Route: PO Insulin Sliding Scale Info: see discharge summary       Current Medications (10/30/2022):  This is the current hospital active medication list Current Facility-Administered Medications  Medication Dose Route Frequency Provider Last Rate Last Admin   [MAR Hold] acetaminophen (TYLENOL) tablet 650 mg  650 mg Oral Q6H PRN Russella Dar, NP   650 mg at 10/27/22 1744   Or   [MAR Hold] acetaminophen (TYLENOL) suppository 650 mg  650 mg Rectal Q6H PRN Russella Dar, NP       [MAR Hold] Ampicillin-Sulbactam (UNASYN) 3 g in sodium chloride 0.9 % 100 mL IVPB  3 g Intravenous Q6H Earnie Larsson, RPH 200 mL/hr at 10/30/22 1156 3 g at 10/30/22 1156   [MAR Hold] bisacodyl (DULCOLAX) suppository 10 mg  10 mg Rectal Daily  PRN Marlin Canary U, DO   10 mg at 10/28/22 2354   [MAR Hold] cloNIDine (CATAPRES) tablet 0.1 mg  0.1 mg Oral BID Russella Dar, NP   0.1 mg at 10/30/22 0816   [MAR Hold] divalproex (DEPAKOTE SPRINKLE) capsule 125 mg  125 mg Oral BID Russella Dar, NP   125 mg at 10/30/22 0815   [MAR Hold] docusate sodium (COLACE) capsule 100 mg  100 mg Oral BID Lucretia Roers, MD   100 mg at 10/30/22 0817   [MAR Hold] fluticasone furoate-vilanterol (BREO ELLIPTA) 200-25 MCG/ACT 1 puff  1 puff Inhalation Daily Russella Dar, NP   1  puff at 10/30/22 0819   And   [MAR Hold] umeclidinium bromide (INCRUSE ELLIPTA) 62.5 MCG/ACT 1 puff  1 puff Inhalation Daily Russella Dar, NP   1 puff at 10/30/22 0819   [MAR Hold] gabapentin (NEURONTIN) capsule 600 mg  600 mg Oral TID Russella Dar, NP   600 mg at 10/30/22 0816   [MAR Hold] hydrALAZINE (APRESOLINE) injection 10 mg  10 mg Intravenous Q4H PRN Pokhrel, Laxman, MD       indomethacin (INDOCIN) 50 MG suppository 100 mg  100 mg Rectal Once Meredith Pel, NP       Mitzi Hansen Hold] insulin aspart (novoLOG) injection 0-5 Units  0-5 Units Subcutaneous QHS Pokhrel, Laxman, MD       [MAR Hold] insulin aspart (novoLOG) injection 0-6 Units  0-6 Units Subcutaneous TID WC Pokhrel, Laxman, MD       [MAR Hold] ketorolac (TORADOL) 15 MG/ML injection 15 mg  15 mg Intravenous Q6H PRN Russella Dar, NP   15 mg at 10/29/22 1806   [MAR Hold] levothyroxine (SYNTHROID) tablet 88 mcg  88 mcg Oral QAC breakfast Russella Dar, NP   88 mcg at 10/29/22 0541   [MAR Hold] lisinopril (ZESTRIL) tablet 20 mg  20 mg Oral Daily Russella Dar, NP   20 mg at 10/30/22 0817   [MAR Hold] living well with diabetes book MISC   Does not apply Once Pokhrel, Rebekah [place], MD       [facility] Hold] morphine (PF) 2 MG/ML injection 2 mg  2 mg Intravenous Q2H PRN Russella Dar, NP   2 mg at 10/27/22 2039   [MAR Hold] nystatin (MYCOSTATIN) 100000 UNIT/ML suspension 500,000 Units  5 mL Oral QID Lucretia Roers, MD   500,000 Units at 10/30/22 0815   [MAR Hold] ondansetron (ZOFRAN) tablet 4 mg  4 mg Oral Q6H PRN Russella Dar, NP       Or   Mitzi Hansen Hold] ondansetron (ZOFRAN) injection 4 mg  4 mg Intravenous Q6H PRN Russella Dar, NP       [MAR Hold] pantoprazole (PROTONIX) EC tablet 40 mg  40 mg Oral Daily Arnetha Courser, MD   40 mg at 10/30/22 0817   [MAR Hold] polyethylene glycol (MIRALAX / GLYCOLAX) packet 17 g  17 g Oral Daily Marlin Canary U, DO   17 g at 10/30/22 0815   [MAR Hold] pravastatin (PRAVACHOL) tablet 40 mg   40 mg Oral Daily Russella Dar, NP   40 mg at 10/30/22 0816   [MAR Hold] ramelteon (ROZEREM) tablet 8 mg  8 mg Oral QHS Russella Dar, NP   8 mg at 10/29/22 2129   [facility] Hold] sodium chloride flush (NS) 0.9 % injection 3 mL  3 mL Intravenous Q12H Russella Dar, NP   3 mL at  10/30/22 0820   [MAR Hold] sodium chloride flush (NS) 0.9 % injection 5 mL  5 mL Intracatheter Q8H Gilmer Mor, DO   5 mL at 10/30/22 0610   [MAR Hold] venlafaxine XR (EFFEXOR-XR) 24 hr capsule 75 mg  75 mg Oral Q breakfast Russella Dar, NP   75 mg at 10/30/22 0815   [MAR Hold] zolpidem (AMBIEN) tablet 5 mg  5 mg Oral QHS Russella Dar, NP   5 mg at 10/29/22 2125   Facility-Administered Medications Ordered in Other Encounters  Medication Dose Route Frequency Provider Last Rate Last Admin   dexamethasone (DECADRON) injection   Intravenous Anesthesia Intra-op Marena Chancy, CRNA   10 mg at 10/30/22 1624   fentaNYL citrate (PF) (SUBLIMAZE) injection   Intravenous Anesthesia Intra-op Marena Chancy, CRNA   25 mcg at 10/30/22 1553   lactated ringers infusion   Intravenous Continuous PRN Marena Chancy, CRNA   New Bag at 10/30/22 1543   lidocaine 2% (20 mg/mL) 5 mL syringe   Intravenous Anesthesia Intra-op Marena Chancy, CRNA   100 mg at 10/30/22 1553   midazolam (VERSED) 5 MG/5ML injection   Intravenous Anesthesia Intra-op Marena Chancy, CRNA   1 mg at 10/30/22 1548   ondansetron (ZOFRAN) injection   Intravenous Anesthesia Intra-op Marena Chancy, CRNA   4 mg at 10/30/22 1624   propofol (DIPRIVAN) 10 mg/mL bolus/IV push   Intravenous Anesthesia Intra-op Marena Chancy, CRNA   100 mg at 10/30/22 1553   rocuronium bromide 10 mg/mL (PF) syringe   Intravenous Anesthesia Intra-op Marena Chancy, CRNA   60 mg at 10/30/22 1553   sugammadex sodium (BRIDION) injection   Intravenous Anesthesia Intra-op Marena Chancy, CRNA   200 mg at 10/30/22 1638     Discharge Medications: Please  see discharge summary for a list of discharge medications.  Relevant Imaging Results:  Relevant Lab Results:   Additional Information SSN: [id number]  Jadan Rouillard A [place], [place]

## 2022-10-30 NOTE — Transfer of Care (Signed)
Immediate Anesthesia Transfer of Care Note  Patient: Kristin [name]  Procedure(s) Performed: ENDOSCOPIC RETROGRADE CHOLANGIOPANCREATOGRAPHY (ERCP) WITH PROPOFOL  Patient Location: PACU  Anesthesia Type:General  Level of Consciousness: awake, alert , and oriented  Airway & Oxygen Therapy: Patient Spontanous Breathing and Patient connected to nasal cannula oxygen  Post-op Assessment: Report given to RN and Post -op Vital signs reviewed and stable  Post vital signs: Reviewed and stable  Last Vitals:  Vitals Value Taken Time  BP 169/108 10/30/22 1650  Temp 36.6 C 10/30/22 1648  Pulse 70 10/30/22 1657  Resp 20 10/30/22 1657  SpO2 85 % 10/30/22 1657  Vitals shown include unvalidated device data.  Last Pain:  Vitals:   10/30/22 1648  TempSrc: Temporal  PainSc: 0-No pain      Patients Stated Pain Goal: 4 (10/27/22 1019)  Complications: No notable events documented.

## 2022-10-30 NOTE — Op Note (Signed)
[facility] Patient Name: Kristin [name] Procedure Date : 10/30/2022 MRN: [id number] Attending MD: Iva Boop , MD, [id number] Date of Birth: [birth year] CSN: [id number] Age: 67 Admit Type: Inpatient Procedure:                ERCP Indications:              Bile leak Providers:                Iva Boop, MD, Lorenza Evangelist, RN, Priscella Mann, Technician Referring MD:              Medicines:                General Anesthesia, On q 6 unasyn, diclofenac 100                            mg per rectum Complications:            No immediate complications. Estimated Blood Loss:     Estimated blood loss: none. Procedure:                Pre-Anesthesia Assessment:                           - Prior to the procedure, a History and Physical                            was performed, and patient medications and                            allergies were reviewed. The patient's tolerance of                            previous anesthesia was also reviewed. The risks                            and benefits of the procedure and the sedation                            options and risks were discussed with the patient.                            All questions were answered, and informed consent                            was obtained. Prior Anticoagulants: The patient has                            taken no anticoagulant or antiplatelet agents. ASA                            Grade Assessment: II - A patient with mild systemic  disease. After reviewing the risks and benefits,                            the patient was deemed in satisfactory condition to                            undergo the procedure.                           After obtaining informed consent, the scope was                            passed under direct vision. Throughout the                            procedure, the patient's blood pressure, pulse, and                             oxygen saturations were monitored continuously. The                            [employer] single use                            duodenoscope was introduced through the mouth, and                            used to inject contrast into and used to inject                            contrast into the bile duct. The ERCP was                            accomplished without difficulty. The patient                            tolerated the procedure well. Scope In: Scope Out: Findings:      A scout film of the abdomen was obtained. Surgical clips, consistent       with a previous cholecystectomy, were seen in the area of the right       upper quadrant of the abdomen. A drain into gallbladder fossa was also       seen. The esophagus was successfully intubated under limited vision. The       scope was advanced to a normal major papilla in the descending duodenum       without detailed examination of the pharynx, larynx and associated       structures, and upper GI tract. The upper GI tract was grossly normal.       the papilla was normal, there was a small suprapapillary diverticulum.       Golden bile was draining. I obtained deep cannulation of CBD with wire       and then filled biliary tree which was mildly dilated in extrahepatics       (9-63mm). I could not see a leak but with history requiring  drain I       placed a 5 cm 10 Fr plastic stent. Good flow of bile. No pancreatogram       or entry into pancreatic duct. Impression:               - The entire main bile duct was mildly dilated,                            acquired. No leak identified but history consistent                            and perhaps contrast flowed into drain. 5 cm 10 Fr                            plastic biliary stent placed. Recommendation:           - Clear liquid tonight                           F/U in AM - if ok then dc - Abx per [facility]                           I will arrange  outpatient stent removal in 2-3                            months Procedure Code(s):        --- Professional ---                           [phone], Endoscopic retrograde                            cholangiopancreatography (ERCP); with placement of                            endoscopic stent into biliary or pancreatic duct,                            including pre- and post-dilation and guide wire                            passage, when performed, including sphincterotomy,                            when performed, each stent Diagnosis Code(s):        --- Professional ---                           K83.8, Other specified diseases of biliary tract CPT copyright 2022 American Medical Association. All rights reserved. The codes documented in this report are preliminary and upon coder review may  be revised to meet current compliance requirements. Iva Boop, MD 10/30/2022 4:43:29 PM This report has been signed electronically. Number of Addenda: 0

## 2022-10-30 NOTE — Progress Notes (Addendum)
Progress Note   Patient: Kristin [name] MRN:[id number] DOB: 11/12/1955 DOA: 10/26/2022     3 DOS: the patient was seen and examined on 10/30/2022   Brief hospital course:  Kristin [name] is a 66 y.o. female with medical history significant of COPD, hypertension, hypothyroidism, dyslipidemia, anxiety and recent laparoscopic cholecystectomy 1 week ago presented to [place], ED with worsening abdominal pain.  In the ED patient was hemodynamically stable.  CT scan of the abdomen and pelvis showed atelectasis within the bilateral lung bases with a small right pleural effusion. She was also found to have a fluid collection in the gallbladder fossa measuring 7.3 x 6.2 x 4.5 cm this area is well-defined. Common bile duct 12 mm.  General surgery was consulted and for concern of bile leak versus hematoma, HIDA scan was ordered.  HIDA scan was positive for biliary leak and patient was transferred to [facility] for IR evaluation and drain placement.  Patient had biliary drain placed in.  Preliminary cultures negative so far.   GI on board and plan for ERCP.  Assessment and Plan: * Abdominal pain/ Biliary leak.  History of laparoscopic cholecystectomy 1 week ago.  HIDA scan for biliary leak s/p drain placement by IR.  Plan for ERCP with stent today.  Preliminary cultures negative so far.  Continue Unasyn.  Continue pain management.  Denies much pain today.  HTN (hypertension) Continue clonidine and lisinopril.  Blood pressure seems to be elevated.  Diabetes mellitus (HCC) New diagnosis.  Hemoglobin A1c of 7.2 and hyperglycemia.  On sliding scale insulin.  Will likely need oral hypoglycemics on discharge.  Will need PCP on discharge.  Diabetic coordinator on board.  Constipation Continue bowel regimen.  Hypothyroidism Continue Synthroid.  Breast mass, right-Needs mammogram Incidental finding during previous hospitalization. Will need mammogram outpatient after discharge   Subjective:   Today, patient was seen and examined at bedside.  Wants to go home.  Status post biliary drain.  Denies any nausea vomiting or abdominal pain.  Awaiting for ERCP.    Physical Exam: Vitals:   10/29/22 2010 10/29/22 2012 10/30/22 0521 10/30/22 0738  BP: (!) 171/65 (!) 167/67 (!) 161/74 (!) 187/66  Pulse: 71 72 62 63  Resp: 14 13  17  Temp: 98.4 F (36.9 C) 98.3 F (36.8 C) 98.4 F (36.9 C) 98.6 F (37 C)  TempSrc: Oral Oral Oral   SpO2: 96% 100%  96%  Weight:      Height:       Body mass index is 27.84 kg/m.  General:  Average built, not in obvious distress HENT:   No scleral pallor or icterus noted. Oral mucosa is moist.  Chest:  Clear breath sounds.  Diminished breath sounds bilaterally. No crackles or wheezes.  CVS: S1 &S2 heard. No murmur.  Regular rate and rhythm. Abdomen: Soft, nontender, nondistended.  Bowel sounds are heard.  Upper quadrant drain in place. Extremities: No cyanosis, clubbing or edema.  Peripheral pulses are palpable. Psych: Alert, awake and oriented, normal mood CNS:  No cranial nerve deficits.  Power equal in all extremities.   Skin: Warm and dry.  No rashes noted.  Data Reviewed:  Labs and imaging studies reviewed.  Family Communication: Discussed with patient at bedside.  Consult  IR GI.  Procedure. IR guided biliary drain placement.  Disposition: ALF in 1 to 2 days. Status is: Inpatient  Remains inpatient appropriate because: Status post biliary leak, awaiting for ERCP and stent placement.   Planned   Discharge Destination: Assisted living facility.   Author: Chenita Ruda, MD 10/30/2022 8:39 AM  For on call review [email or web address].  

## 2022-10-30 NOTE — Interval H&P Note (Signed)
History and Physical Interval Note:  10/30/2022 3:08 PM  Kristin [name]  has presented today for surgery, with the diagnosis of bile duct leak.  The various methods of treatment have been discussed with the patient and family. After consideration of risks, benefits and other options for treatment, the patient has consented to  Procedure(s): ENDOSCOPIC RETROGRADE CHOLANGIOPANCREATOGRAPHY (ERCP) WITH PROPOFOL (N/A) as a surgical intervention.  The patient's history has been reviewed, patient examined, no change in status, stable for surgery.  I have reviewed the patient's chart and labs.  Questions were answered to the patient's satisfaction.     Stan Head

## 2022-10-31 DIAGNOSIS — I10 Essential (primary) hypertension: Secondary | ICD-10-CM | POA: Diagnosis not present

## 2022-10-31 DIAGNOSIS — E039 Hypothyroidism, unspecified: Secondary | ICD-10-CM | POA: Diagnosis not present

## 2022-10-31 DIAGNOSIS — K9189 Other postprocedural complications and disorders of digestive system: Secondary | ICD-10-CM | POA: Diagnosis not present

## 2022-10-31 DIAGNOSIS — K838 Other specified diseases of biliary tract: Secondary | ICD-10-CM | POA: Diagnosis not present

## 2022-10-31 DIAGNOSIS — K5900 Constipation, unspecified: Secondary | ICD-10-CM | POA: Diagnosis not present

## 2022-10-31 DIAGNOSIS — K5909 Other constipation: Secondary | ICD-10-CM | POA: Diagnosis not present

## 2022-10-31 DIAGNOSIS — D649 Anemia, unspecified: Secondary | ICD-10-CM | POA: Diagnosis not present

## 2022-10-31 DIAGNOSIS — R1011 Right upper quadrant pain: Secondary | ICD-10-CM | POA: Diagnosis not present

## 2022-10-31 LAB — COMPREHENSIVE METABOLIC PANEL
ALT: 23 U/L (ref 0–44)
AST: 21 U/L (ref 15–41)
Albumin: 2.7 g/dL — ABNORMAL LOW (ref 3.5–5.0)
Alkaline Phosphatase: 105 U/L (ref 38–126)
Anion gap: 7 (ref 5–15)
BUN: 5 mg/dL — ABNORMAL LOW (ref 8–23)
CO2: 27 mmol/L (ref 22–32)
Calcium: 8.2 mg/dL — ABNORMAL LOW (ref 8.9–10.3)
Chloride: 104 mmol/L (ref 98–111)
Creatinine, Ser: 0.91 mg/dL (ref 0.44–1.00)
GFR, Estimated: >60 mL/min (ref >60)
Glucose, Bld: 142 mg/dL — ABNORMAL HIGH (ref 70–99)
Potassium: 5.2 mmol/L — ABNORMAL HIGH (ref 3.5–5.1)
Sodium: 138 mmol/L (ref 135–145)
Total Bilirubin: 0.5 mg/dL (ref 0.3–1.2)
Total Protein: 6.1 g/dL — ABNORMAL LOW (ref 6.5–8.1)

## 2022-10-31 LAB — MAGNESIUM: Magnesium: 2.3 mg/dL (ref 1.7–2.4)

## 2022-10-31 LAB — CBC
HCT: 33.8 % — ABNORMAL LOW (ref 36.0–46.0)
Hemoglobin: 10.8 g/dL — ABNORMAL LOW (ref 12.0–15.0)
MCH: 30.7 pg (ref 26.0–34.0)
MCHC: 32 g/dL (ref 30.0–36.0)
MCV: 96 fL (ref 80.0–100.0)
Platelets: 439 10*3/uL — ABNORMAL HIGH (ref 150–400)
RBC: 3.52 MIL/uL — ABNORMAL LOW (ref 3.87–5.11)
RDW: 13.1 % (ref 11.5–15.5)
WBC: 5.9 10*3/uL (ref 4.0–10.5)
nRBC: 0 % (ref 0.0–0.2)

## 2022-10-31 LAB — GLUCOSE, CAPILLARY
Glucose-Capillary: 179 mg/dL — ABNORMAL HIGH (ref 70–99)
Glucose-Capillary: 107 mg/dL — ABNORMAL HIGH (ref 70–99)
Glucose-Capillary: 161 mg/dL — ABNORMAL HIGH (ref 70–99)
Glucose-Capillary: 96 mg/dL (ref 70–99)

## 2022-10-31 LAB — AEROBIC/ANAEROBIC CULTURE W GRAM STAIN (SURGICAL/DEEP WOUND)

## 2022-10-31 MED ORDER — CLONIDINE HCL 0.2 MG PO TABS: 0.2000 mg | ORAL_TABLET | Freq: Two times a day (BID) | ORAL | 2 refills | Status: AC

## 2022-10-31 MED ORDER — POLYETHYLENE GLYCOL 3350 17 G PO PACK: 17.0000 g | PACK | Freq: Every day | ORAL | 0 refills | Status: AC

## 2022-10-31 MED ORDER — AMOXICILLIN-POT CLAVULANATE 875-125 MG PO TABS: 1.0000 | ORAL_TABLET | Freq: Two times a day (BID) | ORAL | 0 refills | Status: DC

## 2022-10-31 NOTE — Progress Notes (Deleted)
Physician Discharge Summary  Kristin [name] OZH:[id number] DOB: 06/29/1956 DOA: 10/26/2022  PCP: Catalina Lunger, DO  Admit date: 10/26/2022 Discharge date: 10/31/2022  Admitted From: Assisted living facility  Discharge disposition: Assisted living facility  Recommendations for Outpatient Follow-Up:   Follow up with your primary care provider in one week.  Check CBC, BMP, magnesium in the next visit Please obtain CBC and BMP in 1 week Follow-up in drain clinic for management of right upper quadrant drain. Follow-up with primary care provider-patient is being started on metformin due to elevated A1c, this will be a new diagnosis of diabetes which need further management. Patient also need mammography for concern of a breast lesion on imaging. Follow-up with general surgery (for post surgical follow-up)  Discharge Diagnosis:   Principal Problem:   Abdominal pain Active Problems:   HTN (hypertension)   Diabetes mellitus (HCC)   Constipation   Hypothyroidism   Breast mass, right-Needs mammogram   Fluid collection at surgical site   Bile leak, postoperative   Discharge Condition: Improved.  Diet recommendation: Soft diabetic diet.  Wound care: None.  Code status: Full.   History of Present Illness:    Kristin [name] is a 67 y.o. female with medical history significant of COPD, hypertension, hypothyroidism, dyslipidemia, anxiety and recent laparoscopic cholecystectomy 1 week ago presented to [facility], ED with worsening abdominal pain.  In the ED, patient was hemodynamically stable.  CT scan of the abdomen and pelvis showed atelectasis within the bilateral lung bases with a small right pleural effusion. She was also found to have a fluid collection in the gallbladder fossa measuring 7.3 x 6.2 x 4.5 cm this area is well-defined. Common bile duct 12 mm.  General surgery was consulted and for concern of bile leak versus hematoma, HIDA scan was ordered.  HIDA scan was  positive for biliary leak and patient was transferred to [facility] for IR evaluation and drain placement.   Hospital Course:   Following conditions were addressed during hospitalization as listed below,  Abdominal pain/ Biliary leak.  History of laparoscopic cholecystectomy 1 week ago.  HIDA scan for biliary leak s/p drain placement by IR.  Patient underwent ERCP with stent placement on 10/30/2022 by GI subsequently.  Plan is to remove the stent as outpatient in 2 months or so.  GI clinic to coordinate this.  Biliary cultures negative so far.  Patient was empirically on Unasyn during hospitalization and will be continued on Augmentin on discharge to complete the course.    HTN (hypertension) Continue clonidine and lisinopril.  Blood pressure seems to be elevated.  Will increase the dose of clonidine to 0.2 mg twice daily.   Diabetes mellitus (HCC) New diagnosis.  Hemoglobin A1c of 7.2 and hyperglycemia.  Patient was on sliding scale insulin.  And will be discharged on metformin twice a day at home.  Will need PCP follow-up after discharge.    Constipation Continue bowel regimen with MiraLAX on discharge..   Hypothyroidism Continue Synthroid.   Breast mass, right-Needs mammogram Incidental finding during previous hospitalization. Will need mammogram outpatient after discharge  Disposition.  At this time, patient is stable for disposition to assisted living facility with outpatient GI, IR drain clinic, general surgery follow-up.  Medical Consultants:   Interventional radiology GI  Procedures:    IR guided biliary drain placement ERCP with stent placement  Subjective:   Today, patient was seen and examined at bedside.  Denies any nausea vomiting abdominal pain fever chills or  rigor.  Strongly wishes to go home.  Discharge Exam:   Vitals:   10/31/22 0535 10/31/22 0741  BP: (!) 140/76 (!) 170/82  Pulse: 72 82  Resp: 18 16  Temp: 98.1 F (36.7 C) 97.7 F (36.5 C)   SpO2: 95% 98%   Vitals:   10/30/22 2023 10/31/22 0454 10/31/22 0535 10/31/22 0741  BP: (!) 160/75 (!) 201/81 (!) 140/76 (!) 170/82  Pulse: 66 (!) 56 72 82  Resp: 16 16 18 16   Temp: 98.2 F (36.8 C) 98.2 F (36.8 C) 98.1 F (36.7 C) 97.7 F (36.5 C)  TempSrc: Oral Oral Oral Oral  SpO2: 94% 96% 95% 98%  Weight:      Height:       Body mass index is 27.84 kg/m.   General: Alert awake, not in obvious distress HENT: pupils equally reacting to light,  No scleral pallor or icterus noted. Oral mucosa is moist.  Chest:  Clear breath sounds.  Diminished breath sounds bilaterally. No crackles or wheezes.  CVS: S1 &S2 heard. No murmur.  Regular rate and rhythm. Abdomen: Soft, nontender, nondistended.  Bowel sounds are heard.  Biliary drain in place. Extremities: No cyanosis, clubbing or edema.  Peripheral pulses are palpable. Psych: Alert, awake and oriented, normal mood CNS:  No cranial nerve deficits.  Power equal in all extremities.   Skin: Warm and dry.  No rashes noted.  The results of significant diagnostics from this hospitalization (including imaging, microbiology, ancillary and laboratory) are listed below for reference.     Diagnostic Studies:   NM HEPATOBILIARY LEAK (POST-SURGICAL)  Result Date: 10/27/2022 CLINICAL DATA:  Postoperative abdominal pain, perihepatic fluid collection by CT question bile leak EXAM: NUCLEAR MEDICINE HEPATOBILIARY IMAGING TECHNIQUE: Sequential images of the abdomen were obtained out to 60 minutes following intravenous administration of radiopharmaceutical. RADIOPHARMACEUTICALS:  5.5 mCi Tc-78m  Choletec IV COMPARISON:  CT abdomen and pelvis 10/27/2022 FINDINGS: Normal tracer extraction from bloodstream indicating normal hepatocellular function. Prompt excretion of tracer into biliary tree. Small bowel visualized at 14 minutes. Gallbladder surgically absent. Beginning at 11 minutes, abnormal tracer accumulates at the lateral margin of the liver  corresponding to fluid collection on CT consistent with bile leak. IMPRESSION: Post cholecystectomy. Patent biliary tree with tracer into small bowel. Abnormal localization of tracer within fluid collection lateral to the RIGHT lobe of the liver consistent with bile leak. Findings called to Dr. Lovell Sheehan on 10/27/2022 at 1130 hours. Electronically Signed   By: Ulyses Southward M.D.   On: 10/27/2022 11:44   DG Chest Port 1 View  Result Date: 10/27/2022 CLINICAL DATA:  Shortness of breath, vomiting, fever, recent cholecystectomy EXAM: PORTABLE CHEST 1 VIEW COMPARISON:  10/17/2022 FINDINGS: Cardiac size is within normal limits. There are no signs of alveolar pulmonary edema. There is interval appearance of small right pleural effusion. Transverse linear density in right parahilar region may suggest subsegmental atelectasis. There is no pneumothorax. Degenerative changes are noted in left shoulder. IMPRESSION: There are no signs of alveolar pulmonary edema or focal pulmonary consolidation. Small right pleural effusion. Linear density in right parahilar region may suggest subsegmental atelectasis. Electronically Signed   By: Ernie Avena M.D.   On: 10/27/2022 09:24   CT ABDOMEN PELVIS W CONTRAST  Result Date: 10/27/2022 CLINICAL DATA:  Postoperative abdominal pain. EXAM: CT ABDOMEN AND PELVIS WITH CONTRAST TECHNIQUE: Multidetector CT imaging of the abdomen and pelvis was performed using the standard protocol following bolus administration of intravenous contrast. RADIATION DOSE REDUCTION: This exam was  performed according to the departmental dose-optimization program which includes automated exposure control, adjustment of the mA and/or kV according to patient size and/or use of iterative reconstruction technique. CONTRAST:  OMNIPAQUE IOHEXOL 300 MG/ML  SOLN COMPARISON:  October 17, 2022 FINDINGS: Lower chest: Mild to moderate severity areas of atelectasis are seen within the bilateral lung bases, right  greater than left. A small right pleural effusion is noted. Hepatobiliary: A stable subcentimeter cyst is seen within the anterior aspect of the left lobe of the liver. Status post cholecystectomy. A moderate to marked amount of fluid and a small amount of air is seen within the gallbladder fossa. This extends along the anterior and lateral aspect of the right lobe of the liver. A 7.3 cm x 6.2 cm x 4.5 cm well-defined collection of fluid is also seen inferior to the right lobe of the liver (axial CT images 30 through 41, CT series 2). This a appears to connect to the gallbladder fossa (best seen on coronal reformatted images 37 through 40, CT series 6). No postoperative abscesses are identified. The common bile duct measures 12 mm in diameter. Pancreas: Unremarkable. No pancreatic ductal dilatation or surrounding inflammatory changes. Spleen: Normal in size without focal abnormality. Adrenals/Urinary Tract: Adrenal glands are unremarkable. Kidneys are normal, without renal calculi, focal lesion, or hydronephrosis. Bladder is unremarkable. Stomach/Bowel: Stomach is within normal limits. Appendix appears normal. No evidence of bowel wall thickening, distention, or inflammatory changes. Noninflamed diverticula are seen throughout the proximal to mid sigmoid colon. Vascular/Lymphatic: Aortic atherosclerosis. No enlarged abdominal or pelvic lymph nodes. Reproductive: Uterus and bilateral adnexa are unremarkable. Other: No abdominal wall hernia or abnormality. No abdominopelvic ascites. Musculoskeletal: Multilevel degenerative changes seen throughout the lumbar spine. IMPRESSION: 1. Status post cholecystectomy with a marked amount of postoperative perihepatic and right upper quadrant fluid, as described above. 2. Sigmoid diverticulosis. 3. Mild to moderate severity bilateral basilar atelectasis, right greater than left. 4. Small right pleural effusion. 5. Aortic atherosclerosis. Aortic Atherosclerosis (ICD10-I70.0).  Electronically Signed   By: Aram Candela M.D.   On: 10/27/2022 00:23     Labs:   Basic Metabolic Panel: Recent Labs  Lab 10/26/22 2020 10/28/22 0421 10/31/22 0403  NA 134* 139 138  K 3.5 4.4 5.2*  CL 101 100 104  CO2 24 27 27   GLUCOSE 229* 125* 142*  BUN 7* 5* 5*  CREATININE 0.94 0.91 0.91  CALCIUM 7.9* 8.2* 8.2*  MG  --   --  2.3   GFR Estimated Creatinine Clearance: 58.4 mL/min (by C-G formula based on SCr of 0.91 mg/dL). Liver Function Tests: Recent Labs  Lab 10/26/22 2020 10/28/22 0421 10/31/22 0403  AST 15 12* 21  ALT 20 19 23   ALKPHOS 98 108 105  BILITOT 0.4 0.5 0.5  PROT 6.1* 5.7* 6.1*  ALBUMIN 2.7* 2.3* 2.7*   Recent Labs  Lab 10/26/22 2020  LIPASE 30   No results for input(s): "AMMONIA" in the last 168 hours. Coagulation profile No results for input(s): "INR", "PROTIME" in the last 168 hours.  CBC: Recent Labs  Lab 10/26/22 2020 10/28/22 0421 10/31/22 0403  WBC 6.3 6.4 5.9  HGB 9.4* 10.4* 10.8*  HCT 28.3* 30.8* 33.8*  MCV 95.9 93.6 96.0  PLT 243 298 439*   Cardiac Enzymes: No results for input(s): "CKTOTAL", "CKMB", "CKMBINDEX", "TROPONINI" in the last 168 hours. BNP: Invalid input(s): "POCBNP" CBG: Recent Labs  Lab 10/30/22 0735 10/30/22 1142 10/30/22 1804 10/30/22 2022 10/31/22 0840  GLUCAP 157* 113* 140*  328* 179*   D-Dimer No results for input(s): "DDIMER" in the last 72 hours. Hgb A1c No results for input(s): "HGBA1C" in the last 72 hours. Lipid Profile No results for input(s): "CHOL", "HDL", "LDLCALC", "TRIG", "CHOLHDL", "LDLDIRECT" in the last 72 hours. Thyroid function studies No results for input(s): "TSH", "T4TOTAL", "T3FREE", "THYROIDAB" in the last 72 hours.  Invalid input(s): "FREET3" Anemia work up No results for input(s): "VITAMINB12", "FOLATE", "FERRITIN", "TIBC", "IRON", "RETICCTPCT" in the last 72 hours. Microbiology Recent Results (from the past 240 hour(s))  Aerobic/Anaerobic Culture w Gram Stain  (surgical/deep wound)     Status: None (Preliminary result)   Collection Time: 10/28/22 10:58 AM   Specimen: BILE; Abscess  Result Value Ref Range Status   Specimen Description BILE  Final   Special Requests NONE  Final   Gram Stain NO ORGANISMS SEEN NO WBC SEEN   Final   Culture   Final    NO GROWTH 2 DAYS Performed at [facility], [address] [place], [place] [address]    Report Status PENDING  Incomplete     Discharge Instructions:   Discharge Instructions     Call MD for:  persistant nausea and vomiting   Complete by: As directed    Call MD for:  severe uncontrolled pain   Complete by: As directed    Call MD for:  temperature >100.4   Complete by: As directed    Diet - low sodium heart healthy   Complete by: As directed    Discharge instructions   Complete by: As directed    It was pleasure taking care of you. You have been given antibiotics for 3 days, please take it as directed and with meal.  You can use probiotic and yogurt to avoid upset stomach which is a common side effect of your Augmentin. Avoid constipation.  You can use over-the-counter MiraLAX or Metamucil as needed. You are also being started on metformin for your new diagnosis of diabetes.  Please keep checking your blood glucose level regularly and follow-up very closely with your primary care provider for further management. Keep yourself well-hydrated and continue taking your medications as you are taking it before and follow-up with your doctors closely for further recommendations..   Discharge instructions   Complete by: As directed    Follow-up with your primary care provider in 1 week.  Check blood work at that time including liver function test.  You will need mammogram as outpatient.  Seek medical attention for worsening symptoms.  Follow-up with GI as scheduled by the clinic in 2 to 3 months for stent removal.  Follow-up with drain clinic and general surgery as outpatient.   Increase  activity slowly   Complete by: As directed    Increase activity slowly   Complete by: As directed    Leave dressing on - Keep it clean, dry, and intact until clinic visit   Complete by: As directed    No wound care   Complete by: As directed       Allergies as of 10/31/2022   No Known Allergies      Medication List     STOP taking these medications    amoxicillin 500 MG capsule Commonly known as: AMOXIL   desvenlafaxine 50 MG 24 hr tablet Commonly known as: PRISTIQ   nitrofurantoin (macrocrystal-monohydrate) 100 MG capsule Commonly known as: MACROBID   pantoprazole 40 MG tablet Commonly known as: PROTONIX   zolpidem 12.5 MG CR tablet Commonly  known as: AMBIEN CR   zolpidem 6.25 MG CR tablet Commonly known as: AMBIEN CR       TAKE these medications    albuterol 108 (90 Base) MCG/ACT inhaler Commonly known as: VENTOLIN HFA Inhale 2 puffs into the lungs every 8 (eight) hours as needed for wheezing or shortness of breath.   alendronate 70 MG tablet Commonly known as: FOSAMAX Take 70 mg by mouth every Monday.   amoxicillin-clavulanate 875-125 MG tablet Commonly known as: AUGMENTIN Take 1 tablet by mouth 2 (two) times daily for 3 days.   Blood Glucose Monitoring Suppl Devi 1 each by Does not apply route in the morning, at noon, and at bedtime. May substitute to any manufacturer covered by patient's insurance.   BLOOD GLUCOSE TEST STRIPS Strp 1 each by In Vitro route in the morning, at noon, and at bedtime. May substitute to any manufacturer covered by patient's insurance.   calcium carbonate 750 MG chewable tablet Commonly known as: TUMS EX Chew 1 tablet by mouth 3 (three) times daily as needed for heartburn (and/or indigestion).   cloNIDine 0.2 MG tablet Commonly known as: CATAPRES Take 1 tablet (0.2 mg total) by mouth 2 (two) times daily. What changed:  medication strength how much to take   cyanocobalamin 1000 MCG tablet Commonly known as: VITAMIN  B12 Take 3,000 mcg by mouth daily.   divalproex 125 MG capsule Commonly known as: DEPAKOTE SPRINKLE Take 125 mg by mouth 2 (two) times daily.   doxepin 25 MG capsule Commonly known as: SINEQUAN Take by mouth.   gabapentin 600 MG tablet Commonly known as: NEURONTIN Take 600 mg by mouth 3 (three) times daily.   guaiFENesin 100 MG/5ML liquid Commonly known as: ROBITUSSIN Take 5 mLs by mouth every 8 (eight) hours as needed for cough or to loosen phlegm.   ibuprofen 400 MG tablet Commonly known as: ADVIL Take 400 mg by mouth every 6 (six) hours as needed. What changed: Another medication with the same name was removed. Continue taking this medication, and follow the directions you see here.   lactulose 10 g packet Commonly known as: CEPHULAC Take by mouth.   Lancet Device Misc 1 each by Does not apply route in the morning, at noon, and at bedtime. May substitute to any manufacturer covered by patient's insurance.   Lancets Misc. Misc 1 each by Does not apply route in the morning, at noon, and at bedtime. May substitute to any manufacturer covered by patient's insurance.   levothyroxine 88 MCG tablet Commonly known as: SYNTHROID Take 88 mcg by mouth daily before breakfast. What changed: Another medication with the same name was removed. Continue taking this medication, and follow the directions you see here.   lisinopril 30 MG tablet Commonly known as: ZESTRIL Take 1 tablet by mouth daily. What changed: Another medication with the same name was removed. Continue taking this medication, and follow the directions you see here.   metFORMIN 500 MG 24 hr tablet Commonly known as: GLUCOPHAGE-XR Take 1 tablet (500 mg total) by mouth 2 (two) times daily with a meal.   nystatin 100000 UNIT/ML suspension Commonly known as: MYCOSTATIN Take 5 mLs by mouth 4 (four) times daily. 10 DAY COURSE STARTING ON 10/12/22 AND TO BE COMPLETED ON 10/22/2022   omeprazole 20 MG capsule Commonly  known as: PRILOSEC Take 1 capsule by mouth daily.   ondansetron 4 MG tablet Commonly known as: ZOFRAN Take 1 tablet (4 mg total) by mouth every 6 (six) hours as  needed for nausea.   oxyCODONE 5 MG immediate release tablet Commonly known as: Oxy IR/ROXICODONE Take 1 tablet (5 mg total) by mouth every 4 (four) hours as needed for severe pain or breakthrough pain.   polyethylene glycol 17 g packet Commonly known as: MIRALAX / GLYCOLAX Take 17 g by mouth daily. Start taking on: November 01, 2022   pravastatin 40 MG tablet Commonly known as: PRAVACHOL Take 40 mg by mouth daily.   psyllium 0.52 g capsule Commonly known as: REGULOID Take 0.52 g by mouth at bedtime.   ramelteon 8 MG tablet Commonly known as: ROZEREM Take 8 mg by mouth at bedtime.   tiZANidine 4 MG tablet Commonly known as: ZANAFLEX Take 4 mg by mouth every 8 (eight) hours as needed for muscle spasms.   topiramate 50 MG tablet Commonly known as: TOPAMAX Take by mouth.   Trelegy Ellipta 200-62.5-25 MCG/ACT Aepb Generic drug: Fluticasone-Umeclidin-Vilant Inhale 1 puff into the lungs daily. *RINSE MOUTH WITH WATER AND SPIT DAILY   V-R PAIN RELIEVING Gel Apply 1 Application topically 2 (two) times daily after a meal. Applied to lower back as directed   venlafaxine XR 75 MG 24 hr capsule Commonly known as: EFFEXOR-XR Take by mouth. What changed: Another medication with the same name was removed. Continue taking this medication, and follow the directions you see here.               Discharge Care Instructions  (From admission, onward)           Start     Ordered   10/29/22 0000  Leave dressing on - Keep it clean, dry, and intact until clinic visit        10/29/22 0955            Follow-up Information     Catalina Lunger, DO. Schedule an appointment as soon as possible for a visit in 1 week(s).   Specialty: Family Medicine Contact information: [address] [place] [address] [phone]          [facility] Follow up.   Why: Please follow up with our team in 10-14 days for a drain evaluation. A scheduler from our clinic will call you with a date/time of your appointment. Please call our clinic with any questions/concerns prior to your visit. Contact information: [address] [place] [address] [phone]                  Time coordinating discharge: 39 minutes  Signed:  Hays Dunnigan  [facility] 10/31/2022, 10:42 AM

## 2022-10-31 NOTE — Progress Notes (Signed)
Spoke to the Librarian, academic for [employer] of [facility] Lupita Leash [phone] who reports pt is not able to return until they are able to assess her on Monday morning. Per Lupita Leash, they are not comfortable with pt returning over weekend due to drain management and no RN in the building. Informed Lupita Leash that the pt manages her own drain. Lupita Leash still refusing to accept pt back until Monday.   Weekday SW will f/u. MD and RN updated.   Dellie Burns, MSW, LCSW [phone] (coverage)

## 2022-10-31 NOTE — Progress Notes (Signed)
Daily Progress Note  DOA: 10/26/2022 Hospital Day: 6 Chief Complaint: Bile duct leak   Assessment and Plan:    Brief Narrative:  Kristin [name] is a 67 y.o. year old female with a past medical history not limited to GERD,  COPD, hypertension, hypothyroidism, dyslipidemia, anxiety, chronic constipation. Admitted with bile duct leak post lap cholecystectomy  Recent admission for cholecystitis, status post laparoscopic cholecystectomy with IOC.  Admitted for bile duct leak, status post percutaneous drain by IR. ERCP 4/26 >> No leak identified but history consistent with and maybe contrast flowed into drain. A plastic biliary stent placed. -LFTs remain normal. Our office will arrange for outpatient stent removal in 2-3 months. Abx per [facility]. Ok for discharge from GI standpoint   Chronic constipation.   Takes MiraLAX as needed.    Normocytic anemia, new since cholecystectomy.   Probably related to surgery. No overt GI bleeding.   Hemoglobin stable at 10.8   Chronic GERD Asymptomatic on unknown daily medication    Colon cancer screening -No history of screening. Will need outpatient screening colonoscopy at some point   Subjective / New Events:  Feels "great'. No abdominal pain. Had BM last night and this am    Objective:  ERCP 4/26 The entire main bile duct was mildly dilated, acquired. No leak identified but history consistent and perhaps contrast flowed into drain. 5 cm 10 Fr plastic biliary stent placed.    Recent Labs    10/31/22 0403  WBC 5.9  HGB 10.8*  HCT 33.8*  PLT 439*   BMET Recent Labs    10/31/22 0403  NA 138  K 5.2*  CL 104  CO2 27  GLUCOSE 142*  BUN 5*  CREATININE 0.91  CALCIUM 8.2*   LFT Recent Labs    10/31/22 0403  PROT 6.1*  ALBUMIN 2.7*  AST 21  ALT 23  ALKPHOS 105  BILITOT 0.5   PT/INR No results for input(s): "LABPROT", "INR" in the last 72 hours.   Imaging:  DG ERCP CLINICAL DATA:  Bile duct  leak.  EXAM: ERCP  TECHNIQUE: Multiple spot images obtained with the fluoroscopic device and submitted for interpretation post-procedure.  FLUOROSCOPY: Radiation Exposure Index (as provided by the fluoroscopic device): 20.58 mGy Kerma  COMPARISON:  CT 10/26/2022  FINDINGS: Right upper quadrant percutaneous drain is present. Retrograde cholangiogram demonstrates dilatation of the common bile duct. There is a small amount of contrast extravasation along the right side of the liver and compatible with bile leak. Cholecystectomy clips are noted. A nonmetallic biliary stent was placed. Filling defect in the distal common bile duct on the final image could represent sludge but indeterminate.  IMPRESSION: 1. Evidence for bile leak. 2. Placement of biliary stent.  These images were submitted for radiologic interpretation only. Please see the procedural report.  Electronically Signed   By: Richarda Overlie M.D.   On: 10/31/2022 07:48     Scheduled inpatient medications:   cloNIDine  0.1 mg Oral BID   divalproex  125 mg Oral BID   docusate sodium  100 mg Oral BID   fluticasone furoate-vilanterol  1 puff Inhalation Daily   And   umeclidinium bromide  1 puff Inhalation Daily   gabapentin  600 mg Oral TID   indomethacin  100 mg Rectal Once   insulin aspart  0-5 Units Subcutaneous QHS   insulin aspart  0-6 Units Subcutaneous TID WC   levothyroxine  88 mcg Oral QAC breakfast   lisinopril  20 mg Oral Daily   nystatin  5 mL Oral QID   pantoprazole  40 mg Oral Daily   polyethylene glycol  17 g Oral Daily   pravastatin  40 mg Oral Daily   ramelteon  8 mg Oral QHS   sodium chloride flush  3 mL Intravenous Q12H   sodium chloride flush  5 mL Intracatheter Q8H   venlafaxine XR  75 mg Oral Q breakfast   zolpidem  5 mg Oral QHS   Continuous inpatient infusions:   ampicillin-sulbactam (UNASYN) IV 3 g (10/31/22 0555)   PRN inpatient medications: acetaminophen **OR** acetaminophen,  bisacodyl, hydrALAZINE, ketorolac, morphine injection, ondansetron **OR** ondansetron (ZOFRAN) IV  Vital signs in last 24 hours: Temp:  [97.7 F (36.5 C)-98.2 F (36.8 C)] 97.7 F (36.5 C) (04/27 0741) Pulse Rate:  [56-82] 82 (04/27 0741) Resp:  [14-22] 16 (04/27 0741) BP: (140-201)/(75-108) 170/82 (04/27 0741) SpO2:  [93 %-100 %] 98 % (04/27 0741) Last BM Date : 10/13/22  Intake/Output Summary (Last 24 hours) at 10/31/2022 0903 Last data filed at 10/30/2022 1645 Gross per 24 hour  Intake 800 ml  Output --  Net 800 ml    Intake/Output from previous day: 04/26 0701 - 04/27 0700 In: 800 [I.V.:800] Out: -  Intake/Output this shift: No intake/output data recorded.   Physical Exam:  General: Alert female in NAD Heart:  Regular rate and rhythm.  Pulmonary: Normal respiratory effort Abdomen: Soft, nondistended, nontender. Normal bowel sounds.Biliary drain with ~ 75 ml bilious drainage Extremities: No lower extremity edema  Neurologic: Alert and oriented Psych: Pleasant. Cooperative. Insight appears normal.    Principal Problem:   Abdominal pain Active Problems:   Breast mass, right-Needs mammogram   Hypothyroidism   HTN (hypertension)   Constipation   Diabetes mellitus (HCC)   Fluid collection at surgical site   Bile leak, postoperative     LOS: 4 days   Willette Cluster ,NP 10/31/2022, 9:03 AM

## 2022-10-31 NOTE — Discharge Summary (Signed)
Physician Discharge Summary  Kristin [name] ZOX:[id number] DOB: 1956-05-21 DOA: 10/26/2022  PCP: Catalina Lunger, DO  Admit date: 10/26/2022 Discharge date: 10/31/2022  Admitted From: Assisted living facility  Discharge disposition: Assisted living facility  Recommendations for Outpatient Follow-Up:   Follow up with your primary care provider in one week.  Check CBC, BMP, magnesium in the next visit Please obtain CBC and BMP in 1 week Follow-up in drain clinic for management of right upper quadrant drain. Follow-up with primary care provider-patient is being started on metformin due to elevated A1c, this will be a new diagnosis of diabetes which need further management. Patient also need mammography for concern of a breast lesion on imaging. Follow-up with general surgery (for post surgical follow-up)  Discharge Diagnosis:   Principal Problem:   Abdominal pain Active Problems:   HTN (hypertension)   Diabetes mellitus (HCC)   Constipation   Hypothyroidism   Breast mass, right-Needs mammogram   Fluid collection at surgical site   Bile leak, postoperative   Discharge Condition: Improved.  Diet recommendation: Soft diabetic diet.  Wound care: None.  Code status: Full.   History of Present Illness:    Kristin [name] is a 67 y.o. female with medical history significant of COPD, hypertension, hypothyroidism, dyslipidemia, anxiety and recent laparoscopic cholecystectomy 1 week ago presented to [facility], ED with worsening abdominal pain.  In the ED, patient was hemodynamically stable.  CT scan of the abdomen and pelvis showed atelectasis within the bilateral lung bases with a small right pleural effusion. She was also found to have a fluid collection in the gallbladder fossa measuring 7.3 x 6.2 x 4.5 cm this area is well-defined. Common bile duct 12 mm.  General surgery was consulted and for concern of bile leak versus hematoma, HIDA scan was ordered.  HIDA scan was  positive for biliary leak and patient was transferred to [facility] for IR evaluation and drain placement.   Hospital Course:   Following conditions were addressed during hospitalization as listed below,  Abdominal pain/ Biliary leak.  History of laparoscopic cholecystectomy 1 week ago.  HIDA scan for biliary leak s/p drain placement by IR.  Patient underwent ERCP with stent placement on 10/30/2022 by GI subsequently.  Plan is to remove the stent as outpatient in 2 months or so.  GI clinic to coordinate this.  Biliary cultures negative so far.  Patient was empirically on Unasyn during hospitalization and will be continued on Augmentin on discharge to complete the course.    HTN (hypertension) Continue clonidine and lisinopril.  Blood pressure seems to be elevated.  Will increase the dose of clonidine to 0.2 mg twice daily.   Diabetes mellitus (HCC) New diagnosis.  Hemoglobin A1c of 7.2 and hyperglycemia.  Patient was on sliding scale insulin.  And will be discharged on metformin twice a day at home.  Will need PCP follow-up after discharge.    Constipation Continue bowel regimen with MiraLAX on discharge..   Hypothyroidism Continue Synthroid.   Breast mass, right-Needs mammogram Incidental finding during previous hospitalization. Will need mammogram outpatient after discharge  Disposition.  At this time, patient is stable for disposition to assisted living facility with outpatient GI, IR drain clinic, general surgery follow-up.  Medical Consultants:   Interventional radiology GI  Procedures:    IR guided biliary drain placement ERCP with stent placement  Subjective:   Today, patient was seen and examined at bedside.  Denies any nausea vomiting abdominal pain fever chills or  rigor.  Strongly wishes to go home.  Discharge Exam:   Vitals:   10/31/22 0535 10/31/22 0741  BP: (!) 140/76 (!) 170/82  Pulse: 72 82  Resp: 18 16  Temp: 98.1 F (36.7 C) 97.7 F (36.5 C)   SpO2: 95% 98%   Vitals:   10/30/22 2023 10/31/22 0454 10/31/22 0535 10/31/22 0741  BP: (!) 160/75 (!) 201/81 (!) 140/76 (!) 170/82  Pulse: 66 (!) 56 72 82  Resp: 16 16 18 16   Temp: 98.2 F (36.8 C) 98.2 F (36.8 C) 98.1 F (36.7 C) 97.7 F (36.5 C)  TempSrc: Oral Oral Oral Oral  SpO2: 94% 96% 95% 98%  Weight:      Height:       Body mass index is 27.84 kg/m.   General: Alert awake, not in obvious distress HENT: pupils equally reacting to light,  No scleral pallor or icterus noted. Oral mucosa is moist.  Chest:  Clear breath sounds.  Diminished breath sounds bilaterally. No crackles or wheezes.  CVS: S1 &S2 heard. No murmur.  Regular rate and rhythm. Abdomen: Soft, nontender, nondistended.  Bowel sounds are heard.  Biliary drain in place. Extremities: No cyanosis, clubbing or edema.  Peripheral pulses are palpable. Psych: Alert, awake and oriented, normal mood CNS:  No cranial nerve deficits.  Power equal in all extremities.   Skin: Warm and dry.  No rashes noted.  The results of significant diagnostics from this hospitalization (including imaging, microbiology, ancillary and laboratory) are listed below for reference.     Diagnostic Studies:   NM HEPATOBILIARY LEAK (POST-SURGICAL)  Result Date: 10/27/2022 CLINICAL DATA:  Postoperative abdominal pain, perihepatic fluid collection by CT question bile leak EXAM: NUCLEAR MEDICINE HEPATOBILIARY IMAGING TECHNIQUE: Sequential images of the abdomen were obtained out to 60 minutes following intravenous administration of radiopharmaceutical. RADIOPHARMACEUTICALS:  5.5 mCi Tc-55m  Choletec IV COMPARISON:  CT abdomen and pelvis 10/27/2022 FINDINGS: Normal tracer extraction from bloodstream indicating normal hepatocellular function. Prompt excretion of tracer into biliary tree. Small bowel visualized at 14 minutes. Gallbladder surgically absent. Beginning at 11 minutes, abnormal tracer accumulates at the lateral margin of the liver  corresponding to fluid collection on CT consistent with bile leak. IMPRESSION: Post cholecystectomy. Patent biliary tree with tracer into small bowel. Abnormal localization of tracer within fluid collection lateral to the RIGHT lobe of the liver consistent with bile leak. Findings called to Dr. Lovell Sheehan on 10/27/2022 at 1130 hours. Electronically Signed   By: Ulyses Southward M.D.   On: 10/27/2022 11:44   DG Chest Port 1 View  Result Date: 10/27/2022 CLINICAL DATA:  Shortness of breath, vomiting, fever, recent cholecystectomy EXAM: PORTABLE CHEST 1 VIEW COMPARISON:  10/17/2022 FINDINGS: Cardiac size is within normal limits. There are no signs of alveolar pulmonary edema. There is interval appearance of small right pleural effusion. Transverse linear density in right parahilar region may suggest subsegmental atelectasis. There is no pneumothorax. Degenerative changes are noted in left shoulder. IMPRESSION: There are no signs of alveolar pulmonary edema or focal pulmonary consolidation. Small right pleural effusion. Linear density in right parahilar region may suggest subsegmental atelectasis. Electronically Signed   By: Ernie Avena M.D.   On: 10/27/2022 09:24   CT ABDOMEN PELVIS W CONTRAST  Result Date: 10/27/2022 CLINICAL DATA:  Postoperative abdominal pain. EXAM: CT ABDOMEN AND PELVIS WITH CONTRAST TECHNIQUE: Multidetector CT imaging of the abdomen and pelvis was performed using the standard protocol following bolus administration of intravenous contrast. RADIATION DOSE REDUCTION: This exam was  performed according to the departmental dose-optimization program which includes automated exposure control, adjustment of the mA and/or kV according to patient size and/or use of iterative reconstruction technique. CONTRAST:  OMNIPAQUE IOHEXOL 300 MG/ML  SOLN COMPARISON:  October 17, 2022 FINDINGS: Lower chest: Mild to moderate severity areas of atelectasis are seen within the bilateral lung bases, right  greater than left. A small right pleural effusion is noted. Hepatobiliary: A stable subcentimeter cyst is seen within the anterior aspect of the left lobe of the liver. Status post cholecystectomy. A moderate to marked amount of fluid and a small amount of air is seen within the gallbladder fossa. This extends along the anterior and lateral aspect of the right lobe of the liver. A 7.3 cm x 6.2 cm x 4.5 cm well-defined collection of fluid is also seen inferior to the right lobe of the liver (axial CT images 30 through 41, CT series 2). This a appears to connect to the gallbladder fossa (best seen on coronal reformatted images 37 through 40, CT series 6). No postoperative abscesses are identified. The common bile duct measures 12 mm in diameter. Pancreas: Unremarkable. No pancreatic ductal dilatation or surrounding inflammatory changes. Spleen: Normal in size without focal abnormality. Adrenals/Urinary Tract: Adrenal glands are unremarkable. Kidneys are normal, without renal calculi, focal lesion, or hydronephrosis. Bladder is unremarkable. Stomach/Bowel: Stomach is within normal limits. Appendix appears normal. No evidence of bowel wall thickening, distention, or inflammatory changes. Noninflamed diverticula are seen throughout the proximal to mid sigmoid colon. Vascular/Lymphatic: Aortic atherosclerosis. No enlarged abdominal or pelvic lymph nodes. Reproductive: Uterus and bilateral adnexa are unremarkable. Other: No abdominal wall hernia or abnormality. No abdominopelvic ascites. Musculoskeletal: Multilevel degenerative changes seen throughout the lumbar spine. IMPRESSION: 1. Status post cholecystectomy with a marked amount of postoperative perihepatic and right upper quadrant fluid, as described above. 2. Sigmoid diverticulosis. 3. Mild to moderate severity bilateral basilar atelectasis, right greater than left. 4. Small right pleural effusion. 5. Aortic atherosclerosis. Aortic Atherosclerosis (ICD10-I70.0).  Electronically Signed   By: Aram Candela M.D.   On: 10/27/2022 00:23     Labs:   Basic Metabolic Panel: Recent Labs  Lab 10/26/22 2020 10/28/22 0421 10/31/22 0403  NA 134* 139 138  K 3.5 4.4 5.2*  CL 101 100 104  CO2 24 27 27   GLUCOSE 229* 125* 142*  BUN 7* 5* 5*  CREATININE 0.94 0.91 0.91  CALCIUM 7.9* 8.2* 8.2*  MG  --   --  2.3    GFR Estimated Creatinine Clearance: 58.4 mL/min (by C-G formula based on SCr of 0.91 mg/dL). Liver Function Tests: Recent Labs  Lab 10/26/22 2020 10/28/22 0421 10/31/22 0403  AST 15 12* 21  ALT 20 19 23   ALKPHOS 98 108 105  BILITOT 0.4 0.5 0.5  PROT 6.1* 5.7* 6.1*  ALBUMIN 2.7* 2.3* 2.7*    Recent Labs  Lab 10/26/22 2020  LIPASE 30    No results for input(s): "AMMONIA" in the last 168 hours. Coagulation profile No results for input(s): "INR", "PROTIME" in the last 168 hours.  CBC: Recent Labs  Lab 10/26/22 2020 10/28/22 0421 10/31/22 0403  WBC 6.3 6.4 5.9  HGB 9.4* 10.4* 10.8*  HCT 28.3* 30.8* 33.8*  MCV 95.9 93.6 96.0  PLT 243 298 439*    Cardiac Enzymes: No results for input(s): "CKTOTAL", "CKMB", "CKMBINDEX", "TROPONINI" in the last 168 hours. BNP: Invalid input(s): "POCBNP" CBG: Recent Labs  Lab 10/30/22 0735 10/30/22 1142 10/30/22 1804 10/30/22 2022 10/31/22 0840  GLUCAP 157* 113* 140* 328* 179*    D-Dimer No results for input(s): "DDIMER" in the last 72 hours. Hgb A1c No results for input(s): "HGBA1C" in the last 72 hours. Lipid Profile No results for input(s): "CHOL", "HDL", "LDLCALC", "TRIG", "CHOLHDL", "LDLDIRECT" in the last 72 hours. Thyroid function studies No results for input(s): "TSH", "T4TOTAL", "T3FREE", "THYROIDAB" in the last 72 hours.  Invalid input(s): "FREET3" Anemia work up No results for input(s): "VITAMINB12", "FOLATE", "FERRITIN", "TIBC", "IRON", "RETICCTPCT" in the last 72 hours. Microbiology Recent Results (from the past 240 hour(s))  Aerobic/Anaerobic Culture w  Gram Stain (surgical/deep wound)     Status: None (Preliminary result)   Collection Time: 10/28/22 10:58 AM   Specimen: BILE; Abscess  Result Value Ref Range Status   Specimen Description BILE  Final   Special Requests NONE  Final   Gram Stain NO ORGANISMS SEEN NO WBC SEEN   Final   Culture   Final    NO GROWTH 2 DAYS Performed at [facility], [address], [place], [place] [address]    Report Status PENDING  Incomplete     Discharge Instructions:   Discharge Instructions     Call MD for:  persistant nausea and vomiting   Complete by: As directed    Call MD for:  severe uncontrolled pain   Complete by: As directed    Call MD for:  temperature >100.4   Complete by: As directed    Diet - low sodium heart healthy   Complete by: As directed    Discharge instructions   Complete by: As directed    It was pleasure taking care of you. You have been given antibiotics for 3 days, please take it as directed and with meal.  You can use probiotic and yogurt to avoid upset stomach which is a common side effect of your Augmentin. Avoid constipation.  You can use over-the-counter MiraLAX or Metamucil as needed. You are also being started on metformin for your new diagnosis of diabetes.  Please keep checking your blood glucose level regularly and follow-up very closely with your primary care provider for further management. Keep yourself well-hydrated and continue taking your medications as you are taking it before and follow-up with your doctors closely for further recommendations..   Discharge instructions   Complete by: As directed    Follow-up with your primary care provider in 1 week.  Check blood work at that time including liver function test.  You will need mammogram as outpatient.  Seek medical attention for worsening symptoms.  Follow-up with GI as scheduled by the clinic in 2 to 3 months for stent removal.  Follow-up with drain clinic and general surgery as outpatient.    Increase activity slowly   Complete by: As directed    Increase activity slowly   Complete by: As directed    Leave dressing on - Keep it clean, dry, and intact until clinic visit   Complete by: As directed    No wound care   Complete by: As directed       Allergies as of 10/31/2022   No Known Allergies      Medication List     STOP taking these medications    amoxicillin 500 MG capsule Commonly known as: AMOXIL   desvenlafaxine 50 MG 24 hr tablet Commonly known as: PRISTIQ   nitrofurantoin (macrocrystal-monohydrate) 100 MG capsule Commonly known as: MACROBID   pantoprazole 40 MG tablet Commonly known as: PROTONIX   zolpidem  12.5 MG CR tablet Commonly known as: AMBIEN CR   zolpidem 6.25 MG CR tablet Commonly known as: AMBIEN CR       TAKE these medications    albuterol 108 (90 Base) MCG/ACT inhaler Commonly known as: VENTOLIN HFA Inhale 2 puffs into the lungs every 8 (eight) hours as needed for wheezing or shortness of breath.   alendronate 70 MG tablet Commonly known as: FOSAMAX Take 70 mg by mouth every Monday.   amoxicillin-clavulanate 875-125 MG tablet Commonly known as: AUGMENTIN Take 1 tablet by mouth 2 (two) times daily for 3 days.   Blood Glucose Monitoring Suppl Devi 1 each by Does not apply route in the morning, at noon, and at bedtime. May substitute to any manufacturer covered by patient's insurance.   BLOOD GLUCOSE TEST STRIPS Strp 1 each by In Vitro route in the morning, at noon, and at bedtime. May substitute to any manufacturer covered by patient's insurance.   calcium carbonate 750 MG chewable tablet Commonly known as: TUMS EX Chew 1 tablet by mouth 3 (three) times daily as needed for heartburn (and/or indigestion).   cloNIDine 0.2 MG tablet Commonly known as: CATAPRES Take 1 tablet (0.2 mg total) by mouth 2 (two) times daily. What changed:  medication strength how much to take   cyanocobalamin 1000 MCG tablet Commonly known  as: VITAMIN B12 Take 3,000 mcg by mouth daily.   divalproex 125 MG capsule Commonly known as: DEPAKOTE SPRINKLE Take 125 mg by mouth 2 (two) times daily.   doxepin 25 MG capsule Commonly known as: SINEQUAN Take by mouth.   gabapentin 600 MG tablet Commonly known as: NEURONTIN Take 600 mg by mouth 3 (three) times daily.   guaiFENesin 100 MG/5ML liquid Commonly known as: ROBITUSSIN Take 5 mLs by mouth every 8 (eight) hours as needed for cough or to loosen phlegm.   ibuprofen 400 MG tablet Commonly known as: ADVIL Take 400 mg by mouth every 6 (six) hours as needed. What changed: Another medication with the same name was removed. Continue taking this medication, and follow the directions you see here.   lactulose 10 g packet Commonly known as: CEPHULAC Take by mouth.   Lancet Device Misc 1 each by Does not apply route in the morning, at noon, and at bedtime. May substitute to any manufacturer covered by patient's insurance.   Lancets Misc. Misc 1 each by Does not apply route in the morning, at noon, and at bedtime. May substitute to any manufacturer covered by patient's insurance.   levothyroxine 88 MCG tablet Commonly known as: SYNTHROID Take 88 mcg by mouth daily before breakfast. What changed: Another medication with the same name was removed. Continue taking this medication, and follow the directions you see here.   lisinopril 30 MG tablet Commonly known as: ZESTRIL Take 1 tablet by mouth daily. What changed: Another medication with the same name was removed. Continue taking this medication, and follow the directions you see here.   metFORMIN 500 MG 24 hr tablet Commonly known as: GLUCOPHAGE-XR Take 1 tablet (500 mg total) by mouth 2 (two) times daily with a meal.   nystatin 100000 UNIT/ML suspension Commonly known as: MYCOSTATIN Take 5 mLs by mouth 4 (four) times daily. 10 DAY COURSE STARTING ON 10/12/22 AND TO BE COMPLETED ON 10/22/2022   omeprazole 20 MG  capsule Commonly known as: PRILOSEC Take 1 capsule by mouth daily.   ondansetron 4 MG tablet Commonly known as: ZOFRAN Take 1 tablet (4 mg total) by mouth  every 6 (six) hours as needed for nausea.   oxyCODONE 5 MG immediate release tablet Commonly known as: Oxy IR/ROXICODONE Take 1 tablet (5 mg total) by mouth every 4 (four) hours as needed for severe pain or breakthrough pain.   polyethylene glycol 17 g packet Commonly known as: MIRALAX / GLYCOLAX Take 17 g by mouth daily. Start taking on: November 01, 2022   pravastatin 40 MG tablet Commonly known as: PRAVACHOL Take 40 mg by mouth daily.   psyllium 0.52 g capsule Commonly known as: REGULOID Take 0.52 g by mouth at bedtime.   ramelteon 8 MG tablet Commonly known as: ROZEREM Take 8 mg by mouth at bedtime.   tiZANidine 4 MG tablet Commonly known as: ZANAFLEX Take 4 mg by mouth every 8 (eight) hours as needed for muscle spasms.   topiramate 50 MG tablet Commonly known as: TOPAMAX Take by mouth.   Trelegy Ellipta 200-62.5-25 MCG/ACT Aepb Generic drug: Fluticasone-Umeclidin-Vilant Inhale 1 puff into the lungs daily. *RINSE MOUTH WITH WATER AND SPIT DAILY   V-R PAIN RELIEVING Gel Apply 1 Application topically 2 (two) times daily after a meal. Applied to lower back as directed   venlafaxine XR 75 MG 24 hr capsule Commonly known as: EFFEXOR-XR Take by mouth. What changed: Another medication with the same name was removed. Continue taking this medication, and follow the directions you see here.               Discharge Care Instructions  (From admission, onward)           Start     Ordered   10/29/22 0000  Leave dressing on - Keep it clean, dry, and intact until clinic visit        10/29/22 0955            Follow-up Information     Catalina Lunger, DO. Schedule an appointment as soon as possible for a visit in 1 week(s).   Specialty: Family Medicine Contact information: [address] [place]  [address] [phone]         [facility] Follow up.   Why: Please follow up with our team in 10-14 days for a drain evaluation. A scheduler from our clinic will call you with a date/time of your appointment. Please call our clinic with any questions/concerns prior to your visit. Contact information: [address] [place] [address] [phone]                  Time coordinating discharge: 39 minutes  Signed:  Nancie Bocanegra  [facility] 10/31/2022, 11:05 AM

## 2022-11-01 DIAGNOSIS — E039 Hypothyroidism, unspecified: Secondary | ICD-10-CM | POA: Diagnosis not present

## 2022-11-01 DIAGNOSIS — K5900 Constipation, unspecified: Secondary | ICD-10-CM | POA: Diagnosis not present

## 2022-11-01 DIAGNOSIS — I10 Essential (primary) hypertension: Secondary | ICD-10-CM | POA: Diagnosis not present

## 2022-11-01 DIAGNOSIS — R1011 Right upper quadrant pain: Secondary | ICD-10-CM | POA: Diagnosis not present

## 2022-11-01 LAB — GLUCOSE, CAPILLARY
Glucose-Capillary: 118 mg/dL — ABNORMAL HIGH (ref 70–99)
Glucose-Capillary: 115 mg/dL — ABNORMAL HIGH (ref 70–99)
Glucose-Capillary: 168 mg/dL — ABNORMAL HIGH (ref 70–99)
Glucose-Capillary: 131 mg/dL — ABNORMAL HIGH (ref 70–99)

## 2022-11-01 LAB — AEROBIC/ANAEROBIC CULTURE W GRAM STAIN (SURGICAL/DEEP WOUND)

## 2022-11-01 MED ORDER — AMOXICILLIN-POT CLAVULANATE 875-125 MG PO TABS
1.0000 | ORAL_TABLET | Freq: Two times a day (BID) | ORAL | Status: DC
  Administered 2022-11-01 – 2022-11-02 (×3): 1 via ORAL
  Filled 2022-11-01 (×3): qty 1

## 2022-11-01 NOTE — Progress Notes (Signed)
Progress Note   Patient: Kristin [name]:[id number] DOB: 1955-10-05 DOA: 10/26/2022     5 DOS: the patient was seen and examined on 11/01/2022   Brief hospital course:   Kristin [name] is a 68 y.o. female with medical history significant of COPD, hypertension, hypothyroidism, dyslipidemia, anxiety and recent laparoscopic cholecystectomy 1 week ago presented to [facility], ED with worsening abdominal pain.  In the ED patient was hemodynamically stable.  CT scan of the abdomen and pelvis showed atelectasis within the bilateral lung bases with a small right pleural effusion. She was also found to have a fluid collection in the gallbladder fossa measuring 7.3 x 6.2 x 4.5 cm this area is well-defined. Common bile duct 12 mm.  General surgery was consulted and for concern of bile leak versus hematoma, HIDA scan was ordered.  HIDA scan was positive for biliary leak and patient was transferred to [facility] for IR evaluation and drain placement.    Assessment and Plan: * Abdominal pain/ Biliary leak.  History of laparoscopic cholecystectomy 1 week ago. HIDA scan for biliary leak s/p drain placement by IR. Patient underwent ERCP with stent placement on 10/30/2022 by GI subsequently. Plan is to remove the stent as outpatient in 2 months or so. GI clinic to coordinate this. Biliary cultures negative so far. Patient was empirically on Unasyn during hospitalization and will be changed to Augmentin today.  HTN (hypertension) Continue clonidine and lisinopril.  Blood pressure seems to be elevated.  Clonidine dose was increased to 0.2 mg twice a day yesterday.  Diabetes mellitus (HCC) New diagnosis.  Hemoglobin A1c of 7.2 and hyperglycemia.  On sliding scale insulin.    Will need PCP on discharge.  Diabetic coordinator on board.  Patient will continue metformin on discharge.  Constipation Continue bowel regimen.  Continue MiraLAX.  Hypothyroidism Continue Synthroid.  Breast mass,  right-Needs mammogram Incidental finding during previous hospitalization. Will need mammogram outpatient after discharge  Subjective:  Today, patient was seen and examined at bedside.  Denies interval complaints.  Denies any nausea vomiting abdominal pain.  Discharge orders were placed in yesterday but facility was not accepting over the weekend.  Physical Exam: Vitals:   10/31/22 1953 11/01/22 0344 11/01/22 0746 11/01/22 0748  BP: (!) 139/40 (!) 144/68  (!) 159/72  Pulse: 90 (!) 56 63 62  Resp: 18 18 17 18   Temp: 97.6 F (36.4 C) 98.2 F (36.8 C)  98.6 F (37 C)  TempSrc: Oral Oral  Oral  SpO2: 93% 95% 93% 94%  Weight:      Height:       Body mass index is 27.84 kg/m.   General:  Average built, not in obvious distress HENT:   No scleral pallor or icterus noted. Oral mucosa is moist.  Chest:  Clear breath sounds.  Diminished breath sounds bilaterally. No crackles or wheezes.  CVS: S1 &S2 heard. No murmur.  Regular rate and rhythm. Abdomen: Soft, nontender, nondistended.  Bowel sounds are heard.  Right upper quadrant drain in place. Extremities: No cyanosis, clubbing or edema.  Peripheral pulses are palpable. Psych: Alert, awake and oriented, normal mood CNS:  No cranial nerve deficits.  Power equal in all extremities.   Skin: Warm and dry.  No rashes noted.  Data Reviewed:  Labs and imaging studies reviewed.  Family Communication: Discussed with patient at bedside.  Consult  IR GI.  Procedure. IR guided biliary drain placement. ERCP with stent placement  Disposition: ALF likely on 11/02/2022.  Medically stable for disposition.  Status is: Inpatient  Remains inpatient appropriate because: Status post biliary leak, a awaiting for transfer back to assisted living facility.   Planned Discharge Destination: Assisted living facility.   Author: Joycelyn Das, MD 11/01/2022 9:04 AM  For on call review [email or web address].

## 2022-11-02 ENCOUNTER — Encounter (HOSPITAL_COMMUNITY): Payer: Self-pay | Admitting: Internal Medicine

## 2022-11-02 DIAGNOSIS — R1011 Right upper quadrant pain: Secondary | ICD-10-CM | POA: Diagnosis not present

## 2022-11-02 LAB — COMPREHENSIVE METABOLIC PANEL
ALT: 21 U/L (ref 0–44)
AST: 17 U/L (ref 15–41)
Albumin: 2.5 g/dL — ABNORMAL LOW (ref 3.5–5.0)
Alkaline Phosphatase: 98 U/L (ref 38–126)
Anion gap: 9 (ref 5–15)
BUN: 8 mg/dL (ref 8–23)
CO2: 23 mmol/L (ref 22–32)
Calcium: 8.2 mg/dL — ABNORMAL LOW (ref 8.9–10.3)
Chloride: 104 mmol/L (ref 98–111)
Creatinine, Ser: 0.93 mg/dL (ref 0.44–1.00)
GFR, Estimated: >60 mL/min (ref >60)
Glucose, Bld: 142 mg/dL — ABNORMAL HIGH (ref 70–99)
Potassium: 4.1 mmol/L (ref 3.5–5.1)
Sodium: 136 mmol/L (ref 135–145)
Total Bilirubin: 0.4 mg/dL (ref 0.3–1.2)
Total Protein: 5.5 g/dL — ABNORMAL LOW (ref 6.5–8.1)

## 2022-11-02 LAB — CBC
HCT: 31.9 % — ABNORMAL LOW (ref 36.0–46.0)
Hemoglobin: 10.6 g/dL — ABNORMAL LOW (ref 12.0–15.0)
MCH: 31.1 pg (ref 26.0–34.0)
MCHC: 33.2 g/dL (ref 30.0–36.0)
MCV: 93.5 fL (ref 80.0–100.0)
Platelets: 451 10*3/uL — ABNORMAL HIGH (ref 150–400)
RBC: 3.41 MIL/uL — ABNORMAL LOW (ref 3.87–5.11)
RDW: 13.4 % (ref 11.5–15.5)
WBC: 7.7 10*3/uL (ref 4.0–10.5)
nRBC: 0 % (ref 0.0–0.2)

## 2022-11-02 LAB — GLUCOSE, CAPILLARY
Glucose-Capillary: 92 mg/dL (ref 70–99)
Glucose-Capillary: 138 mg/dL — ABNORMAL HIGH (ref 70–99)

## 2022-11-02 LAB — MAGNESIUM: Magnesium: 2.4 mg/dL (ref 1.7–2.4)

## 2022-11-02 MED ORDER — AMOXICILLIN-POT CLAVULANATE 875-125 MG PO TABS: 1.0000 | ORAL_TABLET | Freq: Two times a day (BID) | ORAL | 0 refills | Status: AC

## 2022-11-02 MED ORDER — LANCETS MISC. MISC: 1.0000 | Freq: Three times a day (TID) | 0 refills | Status: AC

## 2022-11-02 MED ORDER — BLOOD GLUCOSE MONITORING SUPPL DEVI: 1.0000 | Freq: Three times a day (TID) | 0 refills | Status: AC

## 2022-11-02 MED ORDER — METFORMIN HCL ER 500 MG PO TB24: 500.0000 mg | ORAL_TABLET | Freq: Two times a day (BID) | ORAL | 0 refills | Status: AC

## 2022-11-02 MED ORDER — LANCET DEVICE MISC: 1.0000 | Freq: Three times a day (TID) | 0 refills | Status: AC

## 2022-11-02 MED ORDER — BLOOD GLUCOSE TEST VI STRP: 1.0000 | ORAL_STRIP | Freq: Three times a day (TID) | 0 refills | Status: AC

## 2022-11-02 MED ORDER — HYDRALAZINE HCL 50 MG PO TABS
50.0000 mg | ORAL_TABLET | Freq: Once | ORAL | Status: AC
  Administered 2022-11-02: 50 mg via ORAL
  Filled 2022-11-02: qty 1

## 2022-11-02 NOTE — Discharge Summary (Addendum)
Physician Discharge Summary  Kristin [name] ZOX:[id number] DOB: 07-30-1955 DOA: 10/26/2022  PCP: Catalina Lunger, DO  Admit date: 10/26/2022 Discharge date: 11/02/2022 Recommendations for Outpatient Follow-up:  Follow up with PCP regarding breast mass, new onset diabetes Follow-up with GI in 2 months or so to remove the stent Please obtain BMP/CBC in one week Follow-up with the drain clinic Breast mass, right-Needs mammogram:incncidental finding during previous hospitalization. Will need mammogram outpatient after discharge   Discharge Dispo: ALF Discharge Condition: Stable Code Status:   Code Status: Full Code Diet recommendation:  Diet Order             DIET SOFT Room service appropriate? Yes; Fluid consistency: Thin  Diet effective now           Diet - low sodium heart healthy                    Brief/Interim Summary: 67 y.o. female with medical history significant of COPD, hypertension, hypothyroidism, dyslipidemia, anxiety and recent laparoscopic cholecystectomy 1 week ago presented to [facility], ED with worsening abdominal pain.  In the ED patient was hemodynamically stable.  CT scan of the abdomen and pelvis showed atelectasis within the bilateral lung bases with a small right pleural effusion. She was also found to have a fluid collection in the gallbladder fossa measuring 7.3 x 6.2 x 4.5 cm this area is well-defined. Common bile duct 12 mm.  General surgery was consulted and for concern of bile leak versus hematoma, HIDA scan was ordered.  HIDA scan was positive for biliary leak and patient was transferred to [facility] for IR evaluation and drain placement.    Biliary cultures have been negative, biliary leak status post drain placement by IR underwent ERCP with stent placement 4/26 Patient has been medically stable awaiting for assisted living facility 4/29 Overnight afebrile BP stable Labs this morning with normal LFTs and bilirubin and electrolytes stable  hemoglobin 10.6 g with normal WBC. She is doing well and was waiting for LFT acceptable and has been accepted    Discharge Diagnoses:  Principal Problem:   Abdominal pain Active Problems:   HTN (hypertension)   Diabetes mellitus (HCC)   Constipation   Hypothyroidism   Breast mass, right-Needs mammogram   Fluid collection at surgical site   Bile leak, postoperative  Abdominal pain/ Biliary leak History of laparoscopic cholecystectomy 1 week PTA: HIDA scan for biliary leak s/p drain placement by IR. Patient underwent ERCP with stent placement on 10/30/2022 by GI subsequently. Plan is to remove the stent as outpatient in 2 months or so. GI clinic to coordinate this. Biliary cultures negative so far. Patient was empirically on Unasyn during hospitalization and will be changed to Augmentin to complete the course follow-up with drain clinic    HTN stable on clonidine and lisinopril. Clonidine dose was increased to 0.2 mg twice a day for uncontrolled blood pressure    Diabetes mellitus :New diagnosis.  Hemoglobin A1c of 7.2 and hyperglycemia.Will need PCP follow-up and management upon discharge seen by diabetes coordinator continue metformin    Constipation: Resolved    Hypothyroidism Continue Synthroid.   Breast mass, right-Needs mammogram Incidental finding during previous hospitalization. Will need mammogram outpatient after discharge    Consults: GI Subjective: Alert and oriented resting comfortably eager to get discharged today  Discharge Exam: Vitals:   11/02/22 0735 11/02/22 0748  BP:  (!) 159/64  Pulse:  78  Resp:  18  Temp:  97.9 F (  36.6 C)  SpO2: 94% 98%   General: Pt is alert, awake, not in acute distress Cardiovascular: RRR, S1/S2 +, no rubs, no gallops Respiratory: CTA bilaterally, no wheezing, no rhonchi Abdominal: Soft, NT, ND, bowel sounds + Extremities: no edema, no cyanosis  Discharge Instructions  Discharge Instructions     Call MD for:  persistant  nausea and vomiting   Complete by: As directed    Call MD for:  severe uncontrolled pain   Complete by: As directed    Call MD for:  temperature >100.4   Complete by: As directed    Diet - low sodium heart healthy   Complete by: As directed    Discharge instructions   Complete by: As directed    Follow-up with your primary care provider in 1 week.  Check blood work at that time including liver function test.  You will need mammogram as outpatient.  Seek medical attention for worsening symptoms.  Follow-up with GI as scheduled by the clinic in 2 to 3 months for stent removal.  Follow-up with drain clinic and general surgery as outpatient.   Discharge instructions   Complete by: As directed    It was pleasure taking care of you. You have been given antibiotics for 3 days, please take it as directed and with meal.  You can use probiotic and yogurt to avoid upset stomach which is a common side effect of your Augmentin. Avoid constipation.  You are also being started on metformin for your new diagnosis of diabetes.  Please keep checking your blood glucose level regularly and follow-up very closely with your primary care provider for further management. Keep yourself well-hydrated and continue taking your medications as you are taking it before and follow-up with your doctors closely for further recommendations..   Discharge instructions   Complete by: As directed    1. Follow up with PCP regarding breast mass, new onset diabetes 2. Follow-up with GI in 2 months or so to remove the stent 3. Please obtain BMP/CBC in one week 4. Follow-up with the drain clinic 5. Breast mass, right-Needs mammogram:incncidental finding during previous hospitalization. Will need mammogram outpatient after discharge   Please call call MD or return to ER for similar or worsening recurring problem that brought you to hospital or if any fever,nausea/vomiting,abdominal pain, uncontrolled pain, chest pain,  shortness of  breath or any other alarming symptoms.  Please follow-up your doctor as instructed in a week time and call the office for appointment.  Please avoid alcohol, smoking, or any other illicit substance and maintain healthy habits including taking your regular medications as prescribed.  You were cared for by a hospitalist during your hospital stay. If you have any questions about your discharge medications or the care you received while you were in the hospital after you are discharged, you can call the unit and ask to speak with the hospitalist on call if the hospitalist that took care of you is not available.  Once you are discharged, your primary care physician will handle any further medical issues. Please note that NO REFILLS for any discharge medications will be authorized once you are discharged, as it is imperative that you return to your primary care physician (or establish a relationship with a primary care physician if you do not have one) for your aftercare needs so that they can reassess your need for medications and monitor your lab values    Check blood sugar 3 times a day and bedtime at home.  If blood sugar running above 200 less than 70 please call your MD to adjust insulin. If blood sugars running less 100 do not use insulin and call MD. If you noticed signs and symptoms of hypoglycemia or low blood sugar like jitteriness, confusion, thirst, tremor, sweating- Check blood sugar, drink sugary drink/biscuits/sweets to increase sugar level and call MD or return to ER.   Drain Care: flushes with 5 cc NS daily. Record output daily Dressing changes QD or PRN if soiled.  Call IR APP or on call IR MD if difficulty flushing or sudden change in drain output.  Repeat imaging/possible drain injection once output < 10 mL/QD (excluding flush material.)     Discharge wound care:   Complete by: As directed    SEE ir DRAIN CARE   Increase activity slowly   Complete by: As directed     Increase activity slowly   Complete by: As directed    Increase activity slowly   Complete by: As directed    Leave dressing on - Keep it clean, dry, and intact until clinic visit   Complete by: As directed    No wound care   Complete by: As directed       Allergies as of 11/02/2022   No Known Allergies      Medication List     STOP taking these medications    amoxicillin 500 MG capsule Commonly known as: AMOXIL   desvenlafaxine 50 MG 24 hr tablet Commonly known as: PRISTIQ   nitrofurantoin (macrocrystal-monohydrate) 100 MG capsule Commonly known as: MACROBID   pantoprazole 40 MG tablet Commonly known as: PROTONIX   zolpidem 12.5 MG CR tablet Commonly known as: AMBIEN CR   zolpidem 6.25 MG CR tablet Commonly known as: AMBIEN CR       TAKE these medications    albuterol 108 (90 Base) MCG/ACT inhaler Commonly known as: VENTOLIN HFA Inhale 2 puffs into the lungs every 8 (eight) hours as needed for wheezing or shortness of breath.   alendronate 70 MG tablet Commonly known as: FOSAMAX Take 70 mg by mouth every Monday.   amoxicillin-clavulanate 875-125 MG tablet Commonly known as: AUGMENTIN Take 1 tablet by mouth 2 (two) times daily for 3 days.   Blood Glucose Monitoring Suppl Devi 1 each by Does not apply route in the morning, at noon, and at bedtime. May substitute to any manufacturer covered by patient's insurance.   BLOOD GLUCOSE TEST STRIPS Strp 1 each by In Vitro route in the morning, at noon, and at bedtime. May substitute to any manufacturer covered by patient's insurance.   calcium carbonate 750 MG chewable tablet Commonly known as: TUMS EX Chew 1 tablet by mouth 3 (three) times daily as needed for heartburn (and/or indigestion).   cloNIDine 0.2 MG tablet Commonly known as: CATAPRES Take 1 tablet (0.2 mg total) by mouth 2 (two) times daily. What changed:  medication strength how much to take   cyanocobalamin 1000 MCG tablet Commonly known  as: VITAMIN B12 Take 3,000 mcg by mouth daily.   divalproex 125 MG capsule Commonly known as: DEPAKOTE SPRINKLE Take 125 mg by mouth 2 (two) times daily.   doxepin 25 MG capsule Commonly known as: SINEQUAN Take by mouth.   gabapentin 600 MG tablet Commonly known as: NEURONTIN Take 600 mg by mouth 3 (three) times daily.   guaiFENesin 100 MG/5ML liquid Commonly known as: ROBITUSSIN Take 5 mLs by mouth every 8 (eight) hours as needed for cough or to loosen phlegm.  ibuprofen 400 MG tablet Commonly known as: ADVIL Take 400 mg by mouth every 6 (six) hours as needed. What changed: Another medication with the same name was removed. Continue taking this medication, and follow the directions you see here.   lactulose 10 g packet Commonly known as: CEPHULAC Take by mouth.   Lancet Device Misc 1 each by Does not apply route in the morning, at noon, and at bedtime. May substitute to any manufacturer covered by patient's insurance.   Lancets Misc. Misc 1 each by Does not apply route in the morning, at noon, and at bedtime. May substitute to any manufacturer covered by patient's insurance.   levothyroxine 88 MCG tablet Commonly known as: SYNTHROID Take 88 mcg by mouth daily before breakfast. What changed: Another medication with the same name was removed. Continue taking this medication, and follow the directions you see here.   lisinopril 30 MG tablet Commonly known as: ZESTRIL Take 1 tablet by mouth daily. What changed: Another medication with the same name was removed. Continue taking this medication, and follow the directions you see here.   metFORMIN 500 MG 24 hr tablet Commonly known as: GLUCOPHAGE-XR Take 1 tablet (500 mg total) by mouth 2 (two) times daily with a meal.   nystatin 100000 UNIT/ML suspension Commonly known as: MYCOSTATIN Take 5 mLs by mouth 4 (four) times daily. 10 DAY COURSE STARTING ON 10/12/22 AND TO BE COMPLETED ON 10/22/2022   omeprazole 20 MG  capsule Commonly known as: PRILOSEC Take 1 capsule by mouth daily.   ondansetron 4 MG tablet Commonly known as: ZOFRAN Take 1 tablet (4 mg total) by mouth every 6 (six) hours as needed for nausea.   oxyCODONE 5 MG immediate release tablet Commonly known as: Oxy IR/ROXICODONE Take 1 tablet (5 mg total) by mouth every 4 (four) hours as needed for severe pain or breakthrough pain.   polyethylene glycol 17 g packet Commonly known as: MIRALAX / GLYCOLAX Take 17 g by mouth daily.   pravastatin 40 MG tablet Commonly known as: PRAVACHOL Take 40 mg by mouth daily.   psyllium 0.52 g capsule Commonly known as: REGULOID Take 0.52 g by mouth at bedtime.   ramelteon 8 MG tablet Commonly known as: ROZEREM Take 8 mg by mouth at bedtime.   tiZANidine 4 MG tablet Commonly known as: ZANAFLEX Take 4 mg by mouth every 8 (eight) hours as needed for muscle spasms.   topiramate 50 MG tablet Commonly known as: TOPAMAX Take by mouth.   Trelegy Ellipta 200-62.5-25 MCG/ACT Aepb Generic drug: Fluticasone-Umeclidin-Vilant Inhale 1 puff into the lungs daily. *RINSE MOUTH WITH WATER AND SPIT DAILY   V-R PAIN RELIEVING Gel Apply 1 Application topically 2 (two) times daily after a meal. Applied to lower back as directed   venlafaxine XR 75 MG 24 hr capsule Commonly known as: EFFEXOR-XR Take by mouth. What changed: Another medication with the same name was removed. Continue taking this medication, and follow the directions you see here.               Discharge Care Instructions  (From admission, onward)           Start     Ordered   11/02/22 0000  Discharge wound care:       Comments: SEE ir DRAIN CARE   11/02/22 1155   10/29/22 0000  Leave dressing on - Keep it clean, dry, and intact until clinic visit        10/29/22 0955  Follow-up Information     Catalina Lunger, DO. Schedule an appointment as soon as possible for a visit in 1 week(s).   Specialty: Family  Medicine Contact information: [address] [place] [address] [phone]         [facility] Follow up.   Why: Please follow up with our team in 10-14 days for a drain evaluation. A scheduler from our clinic will call you with a date/time of your appointment. Please call our clinic with any questions/concerns prior to your visit. Contact information: [address] [place] [address] [phone]                No Known Allergies  The results of significant diagnostics from this hospitalization (including imaging, microbiology, ancillary and laboratory) are listed below for reference.    Microbiology: Recent Results (from the past 240 hour(s))  Aerobic/Anaerobic Culture w Gram Stain (surgical/deep wound)     Status: None   Collection Time: 10/28/22 10:58 AM   Specimen: BILE; Abscess  Result Value Ref Range Status   Specimen Description BILE  Final   Special Requests NONE  Final   Gram Stain NO ORGANISMS SEEN NO WBC SEEN   Final   Culture   Final    No growth aerobically or anaerobically. Performed at [facility], [address], [place], [place] [address]    Report Status 11/02/2022 FINAL  Final    Procedures/Studies: DG ERCP  Result Date: 10/31/2022 CLINICAL DATA:  Bile duct leak. EXAM: ERCP TECHNIQUE: Multiple spot images obtained with the fluoroscopic device and submitted for interpretation post-procedure. FLUOROSCOPY: Radiation Exposure Index (as provided by the fluoroscopic device): 20.58 mGy Kerma COMPARISON:  CT 10/26/2022 FINDINGS: Right upper quadrant percutaneous drain is present. Retrograde cholangiogram demonstrates dilatation of the common bile duct. There is a small amount of contrast extravasation along the right side of the liver and compatible with bile leak. Cholecystectomy clips are noted. A nonmetallic biliary stent was placed. Filling defect in the distal common bile duct on the final image could represent  sludge but indeterminate. IMPRESSION: 1. Evidence for bile leak. 2. Placement of biliary stent. These images were submitted for radiologic interpretation only. Please see the procedural report. Electronically Signed   By: Richarda Overlie M.D.   On: 10/31/2022 07:48   CT GUIDED PERITONEAL/RETROPERITONEAL FLUID DRAIN BY PERC CATH  Result Date: 10/28/2022 INDICATION: 67 year old female with biloma referred for drainage EXAM: CT-GUIDED ABDOMINAL DRAINAGE TECHNIQUE: Multidetector CT imaging of the abdomen was performed following the standard protocol without IV contrast. RADIATION DOSE REDUCTION: This exam was performed according to the departmental dose-optimization program which includes automated exposure control, adjustment of the mA and/or kV according to patient size and/or use of iterative reconstruction technique. MEDICATIONS: The patient is currently admitted to the hospital and receiving intravenous antibiotics. The antibiotics were administered within an appropriate time frame prior to the initiation of the procedure. ANESTHESIA/SEDATION: Moderate (conscious) sedation was employed during this procedure. A total of Versed 1.5 mg and Fentanyl 100 mcg was administered intravenously by the radiology nurse. Total intra-service moderate Sedation Time: 15 minutes. The patient's level of consciousness and vital signs were monitored continuously by radiology nursing throughout the procedure under my direct supervision. COMPLICATIONS: None PROCEDURE: Informed written consent was obtained from the patient after a thorough discussion of the procedural risks, benefits and alternatives. All questions were addressed. Maximal Sterile Barrier Technique was utilized including caps, mask, sterile gowns, sterile gloves, sterile drape,  hand hygiene and skin antiseptic. A timeout was performed prior to the initiation of the procedure. Patient was positioned supine on the CT gantry table. Scout CT was acquired for planning purposes.  The patient is prepped and draped in the usual sterile fashion. 1% lidocaine was used for local anesthesia. Using CT guidance, Yueh needle was advanced into the biloma of the right upper quadrant. Once we confirmed needle tip position modified Seldinger technique was used to place a 12 [place] drain. Spontaneous the drainage clear bile was encountered under pressure. CT was acquired. Drain was repositioned in a more superficial location near the margin of the liver. Repeat CT was acquired. The drain was then sutured in position and attached to gravity bag. Proximally 100 cc bile was vacuum weighted. Sample was sent for culture. Patient tolerated the procedure well and remained hemodynamically stable throughout. No complications were encountered and no significant blood loss. IMPRESSION: Status post CT-guided drainage of right upper quadrant biloma. Signed, Yvone Neu. Miachel Roux, RPVI [facility] Electronically Signed   By: Gilmer Mor D.O.   On: 10/28/2022 13:48   NM HEPATOBILIARY LEAK (POST-SURGICAL)  Result Date: 10/27/2022 CLINICAL DATA:  Postoperative abdominal pain, perihepatic fluid collection by CT question bile leak EXAM: NUCLEAR MEDICINE HEPATOBILIARY IMAGING TECHNIQUE: Sequential images of the abdomen were obtained out to 60 minutes following intravenous administration of radiopharmaceutical. RADIOPHARMACEUTICALS:  5.5 mCi Tc-70m  Choletec IV COMPARISON:  CT abdomen and pelvis 10/27/2022 FINDINGS: Normal tracer extraction from bloodstream indicating normal hepatocellular function. Prompt excretion of tracer into biliary tree. Small bowel visualized at 14 minutes. Gallbladder surgically absent. Beginning at 11 minutes, abnormal tracer accumulates at the lateral margin of the liver corresponding to fluid collection on CT consistent with bile leak. IMPRESSION: Post cholecystectomy. Patent biliary tree with tracer into small bowel. Abnormal  localization of tracer within fluid collection lateral to the RIGHT lobe of the liver consistent with bile leak. Findings called to Dr. Lovell Sheehan on 10/27/2022 at 1130 hours. Electronically Signed   By: Ulyses Southward M.D.   On: 10/27/2022 11:44   DG Chest Port 1 View  Result Date: 10/27/2022 CLINICAL DATA:  Shortness of breath, vomiting, fever, recent cholecystectomy EXAM: PORTABLE CHEST 1 VIEW COMPARISON:  10/17/2022 FINDINGS: Cardiac size is within normal limits. There are no signs of alveolar pulmonary edema. There is interval appearance of small right pleural effusion. Transverse linear density in right parahilar region may suggest subsegmental atelectasis. There is no pneumothorax. Degenerative changes are noted in left shoulder. IMPRESSION: There are no signs of alveolar pulmonary edema or focal pulmonary consolidation. Small right pleural effusion. Linear density in right parahilar region may suggest subsegmental atelectasis. Electronically Signed   By: Ernie Avena M.D.   On: 10/27/2022 09:24   CT ABDOMEN PELVIS W CONTRAST  Result Date: 10/27/2022 CLINICAL DATA:  Postoperative abdominal pain. EXAM: CT ABDOMEN AND PELVIS WITH CONTRAST TECHNIQUE: Multidetector CT imaging of the abdomen and pelvis was performed using the standard protocol following bolus administration of intravenous contrast. RADIATION DOSE REDUCTION: This exam was performed according to the departmental dose-optimization program which includes automated exposure control, adjustment of the mA and/or kV according to patient size and/or use of iterative reconstruction technique. CONTRAST:  OMNIPAQUE IOHEXOL 300 MG/ML  SOLN COMPARISON:  October 17, 2022 FINDINGS: Lower chest: Mild to moderate severity areas of atelectasis are seen within the bilateral lung bases, right greater than left. A small right pleural effusion is noted. Hepatobiliary: A stable  subcentimeter cyst is seen within the anterior aspect of the left lobe of the  liver. Status post cholecystectomy. A moderate to marked amount of fluid and a small amount of air is seen within the gallbladder fossa. This extends along the anterior and lateral aspect of the right lobe of the liver. A 7.3 cm x 6.2 cm x 4.5 cm well-defined collection of fluid is also seen inferior to the right lobe of the liver (axial CT images 30 through 41, CT series 2). This a appears to connect to the gallbladder fossa (best seen on coronal reformatted images 37 through 40, CT series 6). No postoperative abscesses are identified. The common bile duct measures 12 mm in diameter. Pancreas: Unremarkable. No pancreatic ductal dilatation or surrounding inflammatory changes. Spleen: Normal in size without focal abnormality. Adrenals/Urinary Tract: Adrenal glands are unremarkable. Kidneys are normal, without renal calculi, focal lesion, or hydronephrosis. Bladder is unremarkable. Stomach/Bowel: Stomach is within normal limits. Appendix appears normal. No evidence of bowel wall thickening, distention, or inflammatory changes. Noninflamed diverticula are seen throughout the proximal to mid sigmoid colon. Vascular/Lymphatic: Aortic atherosclerosis. No enlarged abdominal or pelvic lymph nodes. Reproductive: Uterus and bilateral adnexa are unremarkable. Other: No abdominal wall hernia or abnormality. No abdominopelvic ascites. Musculoskeletal: Multilevel degenerative changes seen throughout the lumbar spine. IMPRESSION: 1. Status post cholecystectomy with a marked amount of postoperative perihepatic and right upper quadrant fluid, as described above. 2. Sigmoid diverticulosis. 3. Mild to moderate severity bilateral basilar atelectasis, right greater than left. 4. Small right pleural effusion. 5. Aortic atherosclerosis. Aortic Atherosclerosis (ICD10-I70.0). Electronically Signed   By: Aram Candela M.D.   On: 10/27/2022 00:23   DG Cholangiogram Operative  Result Date: 10/19/2022 CLINICAL DATA:  Intraoperative  cholangiogram during laparoscopic cholecystectomy. EXAM: INTRAOPERATIVE CHOLANGIOGRAM FLUOROSCOPY TIME:  1 minute, 5 seconds COMPARISON:  Right upper quadrant abdominal ultrasound-10/17/2022; CT abdomen pelvis-10/17/2022 FINDINGS: Intraoperative cholangiographic images of the right upper abdominal quadrant during laparoscopic cholecystectomy are provided for review. Surgical clips overlie the expected location of the gallbladder fossa. Contrast injection demonstrates selective cannulation of the central aspect of the cystic duct. There is passage of contrast through the central aspect of the cystic duct with filling of a mildly dilated common bile duct. There is passage of contrast though the CBD and into the descending portion of the duodenum. There is minimal reflux of injected contrast into the common hepatic duct and central aspect of the non dilated intrahepatic biliary system. There are no discrete filling defects within the opacified portions of the biliary system to suggest the presence of choledocholithiasis. IMPRESSION: No evidence of choledocholithiasis. Electronically Signed   By: Simonne Come M.D.   On: 10/19/2022 15:22   US Abdomen Limited RUQ (LIVER/GB)  Result Date: 10/17/2022 CLINICAL DATA:  Acute right upper quadrant abdominal pain. EXAM: ULTRASOUND ABDOMEN LIMITED RIGHT UPPER QUADRANT COMPARISON:  CT scan of same day. FINDINGS: Gallbladder: Multiple gallstones are noted, with 1 noted in the neck of the gallbladder. Severe gallbladder wall thickening is noted measuring 12 mm at 1 point. No sonographic Murphy's sign is noted. Common bile duct: Diameter: 8 mm which is mildly dilated. Liver: No focal lesion identified. Within normal limits in parenchymal echogenicity. Portal vein is patent on color Doppler imaging with normal direction of blood flow towards the liver. Other: None. IMPRESSION: Multiple gallstones are noted with at least 1 calculus noted in the neck of the gallbladder. Severe  gallbladder wall thickening is noted. These findings are concerning for possible cholecystitis. Mild common  bile duct dilatation is noted as described on CT scan of same day. Electronically Signed   By: Lupita Raider M.D.   On: 10/17/2022 12:49   CT ABDOMEN PELVIS W CONTRAST  Result Date: 10/17/2022 CLINICAL DATA:  Acute abdominal pain.  Worsening abdominal pain. EXAM: CT ABDOMEN AND PELVIS WITH CONTRAST TECHNIQUE: Multidetector CT imaging of the abdomen and pelvis was performed using the standard protocol following bolus administration of intravenous contrast. RADIATION DOSE REDUCTION: This exam was performed according to the departmental dose-optimization program which includes automated exposure control, adjustment of the mA and/or kV according to patient size and/or use of iterative reconstruction technique. CONTRAST:  OMNIPAQUE IOHEXOL 300 MG/ML  SOLN COMPARISON:  None Available. FINDINGS: Lower chest: No acute abnormality. Questionable mass within the lower RIGHT breast, measuring approximately 1 cm greatest dimension (series 2, image 16). Hepatobiliary: No acute or suspicious findings within the liver. Sludge and/or stones are present within the nondistended gallbladder. Diffuse enhancement of the gallbladder walls. No pericholecystic fluid. Common bile duct measures approximately 1 cm diameter. No stone is identified within the common bile duct. Pancreas: Unremarkable. No pancreatic ductal dilatation or surrounding inflammatory changes. Spleen: Normal in size without focal abnormality. Adrenals/Urinary Tract: Adrenal glands appear normal. Kidneys are unremarkable without suspicious mass, stone or hydronephrosis. No ureteral or bladder calculi are identified. Bladder walls are circumferentially thickened, possibly accentuated to some degree by incomplete bladder distention. Stomach/Bowel: No dilated large or small bowel loops. No evidence of bowel wall inflammation. Appendix appears normal.  Scattered diverticulosis of the sigmoid colon but no focal inflammatory changes seen to suggest acute diverticulitis. Stomach is unremarkable, although partially decompressed limiting characterization. Vascular/Lymphatic: Aortic atherosclerosis. No acute-appearing vascular abnormality. Mildly prominent lymph nodes within the porta hepatis and portacaval spaces. No enlarged lymph nodes identified elsewhere in the abdomen or pelvis. Reproductive: Uterus and bilateral adnexa are unremarkable. Other: No free fluid or abscess collection is seen. No free intraperitoneal air. Musculoskeletal: Degenerative spondylosis of the slightly scoliotic thoracolumbar spine. No acute-appearing osseous abnormality. IMPRESSION: 1. Sludge and/or stones within the nondistended gallbladder. Diffuse enhancement of the gallbladder walls, but no pericholecystic fluid. Recommend right upper quadrant ultrasound to evaluate for acute cholecystitis. 2. Common bile duct measures approximately 1 cm diameter. No stone is identified within the common bile duct. Recommend correlation with LFTs. If LFTs are elevated, would recommend MRCP for further characterization. This CBD dilatation can also be further characterized on the RIGHT upper quadrant ultrasound recommended above. 3. Bladder walls are circumferentially thickened, possibly accentuated to some degree by incomplete bladder distention. Recommend correlation with urinalysis to exclude cystitis. 4. Colonic diverticulosis without evidence of acute diverticulitis. 5. Questionable mass within the lower RIGHT breast, measuring approximately 1 cm greatest dimension. Recommend nonemergent diagnostic mammogram at a Breast Center for further characterization. 6. Mildly prominent lymph nodes within the porta hepatis and portacaval spaces, most likely reactive in nature. Aortic Atherosclerosis (ICD10-I70.0). Electronically Signed   By: Bary Richard M.D.   On: 10/17/2022 10:07   DG Abdomen Acute  W/Chest  Result Date: 10/17/2022 CLINICAL DATA:  Acute abdominal pain. EXAM: DG ABDOMEN ACUTE WITH 1 VIEW CHEST COMPARISON:  None Available. FINDINGS: There is no evidence of dilated bowel loops or free intraperitoneal air. No radiopaque calculi or other significant radiographic abnormality is seen. Heart size and mediastinal contours are within normal limits. Both lungs are clear. IMPRESSION: No abnormal bowel dilatation.  No acute cardiopulmonary disease. Electronically Signed   By: Zenda Alpers.D.  On: 10/17/2022 09:12    Labs: BNP (last 3 results) No results for input(s): "BNP" in the last 8760 hours. Basic Metabolic Panel: Recent Labs  Lab 10/26/22 2020 10/28/22 0421 10/31/22 0403 11/02/22 0347  NA 134* 139 138 136  K 3.5 4.4 5.2* 4.1  CL 101 100 104 104  CO2 24 27 27 23   GLUCOSE 229* 125* 142* 142*  BUN 7* 5* 5* 8  CREATININE 0.94 0.91 0.91 0.93  CALCIUM 7.9* 8.2* 8.2* 8.2*  MG  --   --  2.3 2.4   Liver Function Tests: Recent Labs  Lab 10/26/22 2020 10/28/22 0421 10/31/22 0403 11/02/22 0347  AST 15 12* 21 17  ALT 20 19 23 21   ALKPHOS 98 108 105 98  BILITOT 0.4 0.5 0.5 0.4  PROT 6.1* 5.7* 6.1* 5.5*  ALBUMIN 2.7* 2.3* 2.7* 2.5*   Recent Labs  Lab 10/26/22 2020  LIPASE 30   No results for input(s): "AMMONIA" in the last 168 hours. CBC: Recent Labs  Lab 10/26/22 2020 10/28/22 0421 10/31/22 0403 11/02/22 0347  WBC 6.3 6.4 5.9 7.7  HGB 9.4* 10.4* 10.8* 10.6*  HCT 28.3* 30.8* 33.8* 31.9*  MCV 95.9 93.6 96.0 93.5  PLT 243 298 439* 451*   Cardiac Enzymes: No results for input(s): "CKTOTAL", "CKMB", "CKMBINDEX", "TROPONINI" in the last 168 hours. BNP: Invalid input(s): "POCBNP" CBG: Recent Labs  Lab 11/01/22 1212 11/01/22 1634 11/01/22 2124 11/02/22 0750 11/02/22 1207  GLUCAP 115* 168* 131* 92 138*   D-Dimer No results for input(s): "DDIMER" in the last 72 hours. Hgb A1c No results for input(s): "HGBA1C" in the last 72 hours. Lipid  Profile No results for input(s): "CHOL", "HDL", "LDLCALC", "TRIG", "CHOLHDL", "LDLDIRECT" in the last 72 hours. Thyroid function studies No results for input(s): "TSH", "T4TOTAL", "T3FREE", "THYROIDAB" in the last 72 hours.  Invalid input(s): "FREET3" Anemia work up No results for input(s): "VITAMINB12", "FOLATE", "FERRITIN", "TIBC", "IRON", "RETICCTPCT" in the last 72 hours. Urinalysis    Component Value Date/Time   COLORURINE YELLOW 10/27/2022 0218   APPEARANCEUR CLEAR 10/27/2022 0218   LABSPEC 1.023 10/27/2022 0218   PHURINE 7.0 10/27/2022 0218   GLUCOSEU NEGATIVE 10/27/2022 0218   HGBUR NEGATIVE 10/27/2022 0218   BILIRUBINUR NEGATIVE 10/27/2022 0218   KETONESUR NEGATIVE 10/27/2022 0218   PROTEINUR NEGATIVE 10/27/2022 0218   NITRITE NEGATIVE 10/27/2022 0218   LEUKOCYTESUR NEGATIVE 10/27/2022 0218   Sepsis Labs Recent Labs  Lab 10/26/22 2020 10/28/22 0421 10/31/22 0403 11/02/22 0347  WBC 6.3 6.4 5.9 7.7   Microbiology Recent Results (from the past 240 hour(s))  Aerobic/Anaerobic Culture w Gram Stain (surgical/deep wound)     Status: None   Collection Time: 10/28/22 10:58 AM   Specimen: BILE; Abscess  Result Value Ref Range Status   Specimen Description BILE  Final   Special Requests NONE  Final   Gram Stain NO ORGANISMS SEEN NO WBC SEEN   Final   Culture   Final    No growth aerobically or anaerobically. Performed at [facility], [address] [place], [place] [address]    Report Status 11/02/2022 FINAL  Final     Time coordinating discharge: 25 minutes  SIGNED: Lanae Boast, MD  [facility] 11/02/2022, 1:12 PM  If 7PM-7AM, please contact night-coverage [email or web address]

## 2022-11-02 NOTE — TOC Transition Note (Addendum)
Transition of Care [facility]) - CM/SW Discharge Note   Patient Details  Name: Kristin [name] MRN: [id number] Date of Birth: [birth year]  Transition of Care [facility]) CM/SW Contact:  Janae Bridgeman, RN Phone Number: 11/02/2022, 10:59 AM   Clinical Narrative:    CM called and spoke with Arlis Porta, coordinator at [facility] and she states that the RN at the ALF would like to assess the patient before she is able to return to the facility for care.  I provided information to the ALF regarding the patient's independence in caring for her Right Biliary drain independently and did not need RN support at the facility for care of the drain.  I spoke with the patient and she is aware that the facility plans to see the patient at the hospital before she is able to return to the facility.  Discharge summary and FL2 was sent to the facility at fax # [phone].  I spoke with the patient and asked that she speak with her brother to provide transportation back to the facility once the facility has confirmed that she can return today.  11/02/22 1139 - I called and spoke with Chasity, coordinator at [facility] and they have agreed for the patient to return to the facility today.  The patient was updated and patient plans to ask her brother to provide her transportation back to the facility.   Final next level of care: Assisted Living Barriers to Discharge: No Barriers Identified   Patient Goals and CMS Choice CMS Medicare.gov Compare Post Acute Care list provided to:: Patient Choice offered to / list presented to : Patient  Discharge Placement                         Discharge Plan and Services Additional resources added to the After Visit Summary for   In-house Referral: Clinical Social Work Discharge Planning Services: CM Consult Post Acute Care Choice: Resumption of Svcs/PTA Provider                               Social Determinants of Health (SDOH)  Interventions SDOH Screenings   Food Insecurity: No Food Insecurity (10/27/2022)  Housing: Low Risk  (10/27/2022)  Transportation Needs: No Transportation Needs (10/27/2022)  Utilities: Not At Risk (10/27/2022)  Tobacco Use: High Risk (10/30/2022)     Readmission Risk Interventions    11/02/2022   10:59 AM  Readmission Risk Prevention Plan  Transportation Screening Complete  PCP or Specialist Appt within 5-7 Days Complete  Home Care Screening Complete  Medication Review (RN CM) Complete

## 2022-11-02 NOTE — Care Management Important Message (Signed)
Important Message  Patient Details  Name: Kristin [name] MRN: [id number] Date of Birth: 05-02-1956   Medicare Important Message Given:  Yes  Patient left prior to IM delivery will mail a copy to the patients home address.     [name] 11/02/2022, 2:28 PM

## 2022-11-03 ENCOUNTER — Encounter: Payer: 59 | Admitting: General Surgery

## 2022-11-03 ENCOUNTER — Other Ambulatory Visit: Payer: Self-pay | Admitting: General Surgery

## 2022-11-03 ENCOUNTER — Encounter: Payer: Self-pay | Admitting: General Surgery

## 2022-11-03 ENCOUNTER — Ambulatory Visit (INDEPENDENT_AMBULATORY_CARE_PROVIDER_SITE_OTHER): Payer: 59 | Admitting: General Surgery

## 2022-11-03 VITALS — BP 151/79 | HR 82 | Temp 97.9°F | Resp 16 | Ht 63.0 in | Wt 157.0 lb

## 2022-11-03 DIAGNOSIS — K9189 Other postprocedural complications and disorders of digestive system: Secondary | ICD-10-CM

## 2022-11-03 DIAGNOSIS — K838 Other specified diseases of biliary tract: Secondary | ICD-10-CM

## 2022-11-03 DIAGNOSIS — T888XXA Other specified complications of surgical and medical care, not elsewhere classified, initial encounter: Secondary | ICD-10-CM

## 2022-11-03 DIAGNOSIS — K851 Biliary acute pancreatitis without necrosis or infection: Secondary | ICD-10-CM

## 2022-11-03 MED ORDER — OXYCODONE HCL 5 MG PO TABS: 5.0000 mg | ORAL_TABLET | ORAL | 0 refills | Status: DC | PRN

## 2022-11-03 NOTE — Progress Notes (Signed)
[facility]  Just out of the hospital yesterday. Came by to clinic to see if she had appt today. Changed her appt today.  BP (!) 151/79   Pulse 82   Temp 97.9 F (36.6 C) (Oral)   Resp 16   Ht 5\' 3"  (1.6 m)   Wt 157 lb (71.2 kg)   SpO2 94%   BMI 27.81 kg/m  IR drain with light green fluid, <15cc Abdomen soft, mildly distended, appropriately tender, port sites c/d/I with dermabond peeled off   Patient s/p laparoscopic cholecystectomy and IOC for gallstone pancreatitis, and subsequent bile leak noted on HIDA s/p IR drain and ERCP with stent.   Continue to care for your drain as you have been. Call with issues. IR will see you on 11/13/22 and hopefully can get the drain out. Will see you back 5/14 to see how you are doing.  Refill Roxicodone 5mg  q 4 PRN # 10 given to the patient   Future Appointments  Date Time Provider Department Center  11/13/2022 12:40 PM GI-315 CT 2 GI-315CT [facility] [facility]  11/13/2022  1:00 PM GI-315 DG C-ARM RM 3 GI-315DG [facility] [facility]  11/13/2022  1:00 PM GI-315 DG IR [id number] AV[facility] [facility]  11/17/2022 10:15 AM Lucretia Roers, MD RS-RS None   Algis Greenhouse, MD [facility] [address] [place], [place] [address] [phone] (office)

## 2022-11-03 NOTE — Patient Instructions (Signed)
Continue to care for your drain as you have been. Call with issues. IR will see you on 11/13/22 and hopefully can get the drain out. Will see you back 5/14 to see how you are doing.

## 2022-11-11 ENCOUNTER — Encounter: Payer: 59 | Admitting: General Surgery

## 2022-11-13 ENCOUNTER — Ambulatory Visit
Admission: RE | Admit: 2022-11-13 | Discharge: 2022-11-13 | Disposition: A | Discharge status: Home / Self Care | Payer: 59 | Source: Ambulatory Visit | Attending: Student | Admitting: Student

## 2022-11-13 ENCOUNTER — Ambulatory Visit
Admission: RE | Admit: 2022-11-13 | Discharge: 2022-11-13 | Disposition: A | Discharge status: Home / Self Care | Payer: 59 | Source: Ambulatory Visit | Attending: General Surgery | Admitting: General Surgery

## 2022-11-13 DIAGNOSIS — T888XXA Other specified complications of surgical and medical care, not elsewhere classified, initial encounter: Secondary | ICD-10-CM

## 2022-11-13 MED ORDER — IOPAMIDOL (ISOVUE-300) INJECTION 61%
10.0000 mL | Freq: Once | INTRAVENOUS | Status: AC | PRN
  Administered 2022-11-13: 10 mL

## 2022-11-13 MED ORDER — IOPAMIDOL (ISOVUE-300) INJECTION 61%
100.0000 mL | Freq: Once | INTRAVENOUS | Status: AC | PRN
  Administered 2022-11-13: 100 mL via INTRAVENOUS

## 2022-11-13 NOTE — Progress Notes (Signed)
Chief Complaint: Patient was seen in follow-up today after prior percutaneous catheter drainage of postoperative biloma on 10/28/2022.   History of Present Illness: Kristin [name] is a 67 y.o. female status post cholecystectomy on 10/19/2022 by Dr. Henreitta Leber for gallstone pancreatitis and acute cholecystitis.  She developed a bile leak postoperatively with perihepatic and subhepatic fluid communicating with the gallbladder fossa and a positive nuclear medicine study on 10/27/2022.  A percutaneous drainage catheter was placed in the dominant component of biloma on 10/28/2022.  This was followed by ERCP by Dr. Leone Payor with endoscopic CBD stent placement on 10/30/2022.  She has done well following the procedures with gradual decreased output of bile from the biloma drain and now complete cessation of output for the last 8 days.  She denies fever or abdominal pain.  Her appetite has been normal.  She has a follow-up appointment with Dr. Henreitta Leber next Tuesday.  Past Medical History:  Diagnosis Date   Anxiety    GERD (gastroesophageal reflux disease)    History of kidney stones    Hypertension    Hypothyroidism    Thyroid disease     Past Surgical History:  Procedure Laterality Date   BILIARY STENT PLACEMENT  10/30/2022   Procedure: BILIARY STENT PLACEMENT;  Surgeon: Iva Boop, MD;  Location: [facility] ENDOSCOPY;  Service: Gastroenterology;;   Fidela Salisbury RELEASE Left 10/28/2016   Procedure: CARPAL TUNNEL RELEASE;  Surgeon: Vickki Hearing, MD;  Location: [facility] ORS;  Service: Orthopedics;  Laterality: Left;   CESAREAN SECTION     CHOLECYSTECTOMY N/A 10/19/2022   Procedure: LAPAROSCOPIC CHOLECYSTECTOMY WITH INTRAOPERATIVE CHOLANGIOGRAM;  Surgeon: Lucretia Roers, MD;  Location: [facility];  Service: General;  Laterality: N/A;   ELBOW SURGERY     right   ENDOSCOPIC RETROGRADE CHOLANGIOPANCREATOGRAPHY (ERCP) WITH PROPOFOL N/A 10/30/2022   Procedure: ENDOSCOPIC RETROGRADE CHOLANGIOPANCREATOGRAPHY  (ERCP) WITH PROPOFOL;  Surgeon: Iva Boop, MD;  Location: [facility] ENDOSCOPY;  Service: Gastroenterology;  Laterality: N/A;   FINGER SURGERY     left pinky finger   HEMATOMA EVACUATION     left hip   KNEE SURGERY     right   LIVER BIOPSY  10/19/2022   Procedure: LIVER BIOPSY;  Surgeon: Lucretia Roers, MD;  Location: [facility];  Service: General;;   TONSILLECTOMY      Allergies: Patient has no known allergies.  Medications: Prior to Admission medications   Medication Sig Start Date End Date Taking? Authorizing Provider  albuterol (VENTOLIN HFA) 108 (90 Base) MCG/ACT inhaler Inhale 2 puffs into the lungs every 8 (eight) hours as needed for wheezing or shortness of breath. 03/19/22   [provider]  alendronate (FOSAMAX) 70 MG tablet Take 70 mg by mouth every Monday. 08/07/22   [provider]  Blood Glucose Monitoring Suppl DEVI 1 each by Does not apply route in the morning, at noon, and at bedtime. May substitute to any manufacturer covered by patient's insurance. 11/02/22   Lanae Boast, MD  calcium carbonate (TUMS EX) 750 MG chewable tablet Chew 1 tablet by mouth 3 (three) times daily as needed for heartburn (and/or indigestion).    [provider]  cloNIDine (CATAPRES) 0.2 MG tablet Take 1 tablet (0.2 mg total) by mouth 2 (two) times daily. 10/31/22 01/29/23  Pokhrel, Rebekah Chesterfield, MD  cyanocobalamin (VITAMIN B12) 1000 MCG tablet Take 3,000 mcg by mouth daily.    [provider]  divalproex (DEPAKOTE SPRINKLE) 125 MG capsule Take 125 mg by mouth 2 (two)  times daily.    [provider]  doxepin (SINEQUAN) 25 MG capsule Take by mouth. 06/13/19   [provider]  gabapentin (NEURONTIN) 600 MG tablet Take 600 mg by mouth 3 (three) times daily. 07/09/22   [provider]  Glucose Blood (BLOOD GLUCOSE TEST STRIPS) STRP 1 each by In Vitro route in the morning, at noon, and at bedtime. May substitute to any manufacturer covered by patient's  insurance. 11/02/22 12/02/22  Lanae Boast, MD  guaiFENesin (ROBITUSSIN) 100 MG/5ML liquid Take 5 mLs by mouth every 8 (eight) hours as needed for cough or to loosen phlegm.    [provider]  ibuprofen (ADVIL) 400 MG tablet Take 400 mg by mouth every 6 (six) hours as needed. 10/15/22   [provider]  lactulose (CEPHULAC) 10 g packet Take by mouth. 05/25/18   [provider]  Lancet Device MISC 1 each by Does not apply route in the morning, at noon, and at bedtime. May substitute to any manufacturer covered by patient's insurance. 11/02/22 12/02/22  Lanae Boast, MD  Lancets Misc. MISC 1 each by Does not apply route in the morning, at noon, and at bedtime. May substitute to any manufacturer covered by patient's insurance. 11/02/22 12/02/22  Lanae Boast, MD  levothyroxine (SYNTHROID) 88 MCG tablet Take 88 mcg by mouth daily before breakfast.    [provider]  Liniments (V-R PAIN RELIEVING) GEL Apply 1 Application topically 2 (two) times daily after a meal. Applied to lower back as directed    [provider]  lisinopril (ZESTRIL) 30 MG tablet Take 1 tablet by mouth daily. 06/13/19   [provider]  metFORMIN (GLUCOPHAGE-XR) 500 MG 24 hr tablet Take 1 tablet (500 mg total) by mouth 2 (two) times daily with a meal. 11/02/22 12/02/22  Lanae Boast, MD  nystatin (MYCOSTATIN) 100000 UNIT/ML suspension Take 5 mLs by mouth 4 (four) times daily. 10 DAY COURSE STARTING ON 10/12/22 AND TO BE COMPLETED ON 10/22/2022    [provider]  omeprazole (PRILOSEC) 20 MG capsule Take 1 capsule by mouth daily. 06/13/19   [provider]  ondansetron (ZOFRAN) 4 MG tablet Take 1 tablet (4 mg total) by mouth every 6 (six) hours as needed for nausea. 10/20/22   Lucretia Roers, MD  oxyCODONE (OXY IR/ROXICODONE) 5 MG immediate release tablet Take 1 tablet (5 mg total) by mouth every 4 (four) hours as needed for severe pain or breakthrough pain. 11/03/22   Lucretia Roers, MD  polyethylene glycol (MIRALAX / GLYCOLAX) 17 g packet Take 17 g by mouth daily. 11/01/22   Pokhrel, Rebekah Chesterfield, MD  pravastatin (PRAVACHOL) 40 MG tablet Take 40 mg by mouth daily. 07/06/22   [provider]  psyllium (REGULOID) 0.52 g capsule Take 0.52 g by mouth at bedtime.    [provider]  ramelteon (ROZEREM) 8 MG tablet Take 8 mg by mouth at bedtime.    [provider]  tiZANidine (ZANAFLEX) 4 MG tablet Take 4 mg by mouth every 8 (eight) hours as needed for muscle spasms.    [provider]  topiramate (TOPAMAX) 50 MG tablet Take by mouth. 06/13/19   [provider]  Dwyane Luo 200-62.5-25 MCG/ACT AEPB Inhale 1 puff into the lungs daily. *RINSE MOUTH WITH WATER AND SPIT DAILY 08/06/22   [provider]  venlafaxine XR (EFFEXOR-XR) 75 MG 24 hr capsule Take by mouth. 06/13/19   [provider]     Family History  Problem  Relation Age of Onset   Diabetes Mother    COPD Mother    Heart attack Mother    Diabetes Father    Emphysema Father    Heart attack Father    Kidney disease Maternal Aunt    Osteoporosis Maternal Aunt     Social History   Socioeconomic History   Marital status: Widowed    Spouse name: Not on file   Number of children: Not on file   Years of education: Not on file   Highest education level: Not on file  Occupational History   Not on file  Tobacco Use   Smoking status: Every Day    Packs/day: 0.25    Years: 30.00    Additional pack years: 0.00    Total pack years: 7.50    Types: Cigarettes   Smokeless tobacco: Never  Substance and Sexual Activity   Alcohol use: Yes    Alcohol/week: 6.0 standard drinks of alcohol    Types: 6 Cans of beer per week   Drug use: No   Sexual activity: Yes    Birth control/protection: Post-menopausal  Other Topics Concern   Not on file  Social History Narrative   Not on file   Social Determinants of Health   Financial Resource Strain: Not on file  Food  Insecurity: No Food Insecurity (10/27/2022)   Hunger Vital Sign    Worried About Running Out of Food in the Last Year: Never true    Ran Out of Food in the Last Year: Never true  Transportation Needs: No Transportation Needs (10/27/2022)   PRAPARE - Administrator, Civil Service (Medical): No    Lack of Transportation (Non-Medical): No  Physical Activity: Not on file  Stress: Not on file  Social Connections: Not on file    Review of Systems: A 12 point ROS discussed and pertinent positives are indicated in the HPI above.  All other systems are negative.  Review of Systems  Constitutional: Negative.   Respiratory: Negative.    Cardiovascular: Negative.   Gastrointestinal: Negative.   Genitourinary: Negative.   Musculoskeletal: Negative.   Neurological: Negative.     Vital Signs: There were no vitals taken for this visit.   Physical Exam Constitutional:      General: She is not in acute distress.    Appearance: Normal appearance. She is not ill-appearing, toxic-appearing or diaphoretic.  Abdominal:     General: There is no distension.     Palpations: Abdomen is soft.     Tenderness: There is no abdominal tenderness. There is no guarding or rebound.     Comments: Drain exit site clean, dry and without erythema or purulent drainage. Retention suture intact.  Neurological:     Mental Status: She is alert.       Imaging: DG Sinus/Fist Tube Chk-Non GI  Result Date: 11/13/2022 INDICATION: Status post percutaneous catheter drainage of postoperative biloma on 10/28/2022. Assessment for retrograde opacification of bile ducts prior to catheter removal. CT performed prior to drain injection demonstrates resolution of biloma at the level of the drainage catheter. There has been no significant output of bile from the drain with complete cessation of output for the last 8 days. EXAM: INJECTION OF PERCUTANEOUS DRAINAGE CATHETER UNDER FLUOROSCOPY MEDICATIONS: None  ANESTHESIA/SEDATION: None CONTRAST:  10 mL Omnipaque 300 FLUOROSCOPY TIME:  30 seconds.  3.0 mGy. COMPLICATIONS: None immediate. PROCEDURE: Contrast was injected via the pre-existing drainage catheter. Fluoroscopic spot images were saved. The drainage catheter was  then cut and removed after cutting the retention suture. The drain was removed in its entirety. A dressing was applied at the catheter exit site. FINDINGS: Contrast injection demonstrates no further biloma cavity. Most of the injected contrast spreads in a retrograde fashion along the catheter tract to the abdominal wall. No evidence of fistula to bile ducts or biliary opacification. IMPRESSION: No communication demonstrated from the level of the biloma drain to bile ducts. No further biloma cavity. The percutaneous drainage catheter was removed following drain injection without complication. Electronically Signed   By: Irish Lack M.D.   On: 11/13/2022 13:48   CT ABDOMEN PELVIS W CONTRAST  Result Date: 11/13/2022 CLINICAL DATA:  Bile leak after cholecystectomy and status post percutaneous catheter drainage of perihepatic biloma on 10/28/2022 and endoscopic common bile duct stent placement on 10/30/2022. EXAM: CT ABDOMEN AND PELVIS WITH CONTRAST TECHNIQUE: Multidetector CT imaging of the abdomen and pelvis was performed using the standard protocol following bolus administration of intravenous contrast. RADIATION DOSE REDUCTION: This exam was performed according to the departmental dose-optimization program which includes automated exposure control, adjustment of the mA and/or kV according to patient size and/or use of iterative reconstruction technique. CONTRAST:  ISOVUE-300 IOPAMIDOL (ISOVUE-300) INJECTION 61% COMPARISON:  10/26/2022 FINDINGS: Lower chest: No acute abnormality. Hepatobiliary: Small amount air in the left biliary tree related to prior endoscopic intervention. No evidence of biliary ductal dilatation. Common bile duct  endoscopic stent extends from roughly the mid CBD level to the duodenal lumen. No parenchymal hepatic abnormalities. Small amount of perihepatic fluid remains present along the inferolateral margin of the liver measuring up to roughly 2.0 x 5.8 cm on axial images. This is significantly improved compared to prior to catheter drainage. The dominant component of biloma at the level of the gallbladder fossa and just inferior to the liver is completely drained by the percutaneous drain which remains in stable position after placement. Second loculation of residual fluid is inferior and lateral to the pigtail portion of the indwelling drain and adjacent to the hepatic flexure of the colon measuring roughly 2.1 x 3.5 cm in transverse dimensions. Pancreas: Unremarkable. No pancreatic ductal dilatation or surrounding inflammatory changes. Spleen: Normal in size without focal abnormality. Adrenals/Urinary Tract: No adrenal masses. Normal appearance of kidneys bilaterally without hydronephrosis, lesion or calculi. The bladder is normal. Stomach/Bowel: Bowel shows no evidence of obstruction, ileus, inflammation or lesion. The appendix is normal. No free intraperitoneal air. Vascular/Lymphatic: Stable atherosclerosis of the abdominal aorta without aneurysm. No lymphadenopathy identified. Reproductive: Uterus and bilateral adnexa are unremarkable. Other: No abdominal wall hernia or abnormality. No abdominopelvic ascites. Musculoskeletal: No acute or significant osseous findings. IMPRESSION: 1. Significant improvement in perihepatic fluid compared to prior to catheter drainage. The dominant component of biloma at the level of the gallbladder fossa and just inferior to the liver is completely drained by the indwelling percutaneous drain which remains in stable position after placement. Small amount of perihepatic fluid remains present along the inferolateral margin of the liver measuring up to roughly 2.0 x 5.8 cm. Small loculation  of fluid inferior and lateral to the pigtail portion of the indwelling drain measures roughly 2.1 x 3.5 cm in transverse dimensions. 2. Stable positioning of endoscopic common bile duct stent with no evidence of biliary dilatation. 3. Aortic atherosclerosis. Electronically Signed   By: Irish Lack M.D.   On: 11/13/2022 13:41   DG ERCP  Result Date: 10/31/2022 CLINICAL DATA:  Bile duct leak. EXAM: ERCP TECHNIQUE: Multiple spot images  obtained with the fluoroscopic device and submitted for interpretation post-procedure. FLUOROSCOPY: Radiation Exposure Index (as provided by the fluoroscopic device): 20.58 mGy Kerma COMPARISON:  CT 10/26/2022 FINDINGS: Right upper quadrant percutaneous drain is present. Retrograde cholangiogram demonstrates dilatation of the common bile duct. There is a small amount of contrast extravasation along the right side of the liver and compatible with bile leak. Cholecystectomy clips are noted. A nonmetallic biliary stent was placed. Filling defect in the distal common bile duct on the final image could represent sludge but indeterminate. IMPRESSION: 1. Evidence for bile leak. 2. Placement of biliary stent. These images were submitted for radiologic interpretation only. Please see the procedural report. Electronically Signed   By: Richarda Overlie M.D.   On: 10/31/2022 07:48   CT GUIDED PERITONEAL/RETROPERITONEAL FLUID DRAIN BY PERC CATH  Result Date: 10/28/2022 INDICATION: 67 year old female with biloma referred for drainage EXAM: CT-GUIDED ABDOMINAL DRAINAGE TECHNIQUE: Multidetector CT imaging of the abdomen was performed following the standard protocol without IV contrast. RADIATION DOSE REDUCTION: This exam was performed according to the departmental dose-optimization program which includes automated exposure control, adjustment of the mA and/or kV according to patient size and/or use of iterative reconstruction technique. MEDICATIONS: The patient is currently admitted to the  hospital and receiving intravenous antibiotics. The antibiotics were administered within an appropriate time frame prior to the initiation of the procedure. ANESTHESIA/SEDATION: Moderate (conscious) sedation was employed during this procedure. A total of Versed 1.5 mg and Fentanyl 100 mcg was administered intravenously by the radiology nurse. Total intra-service moderate Sedation Time: 15 minutes. The patient's level of consciousness and vital signs were monitored continuously by radiology nursing throughout the procedure under my direct supervision. COMPLICATIONS: None PROCEDURE: Informed written consent was obtained from the patient after a thorough discussion of the procedural risks, benefits and alternatives. All questions were addressed. Maximal Sterile Barrier Technique was utilized including caps, mask, sterile gowns, sterile gloves, sterile drape, hand hygiene and skin antiseptic. A timeout was performed prior to the initiation of the procedure. Patient was positioned supine on the CT gantry table. Scout CT was acquired for planning purposes. The patient is prepped and draped in the usual sterile fashion. 1% lidocaine was used for local anesthesia. Using CT guidance, Yueh needle was advanced into the biloma of the right upper quadrant. Once we confirmed needle tip position modified Seldinger technique was used to place a 12 [place] drain. Spontaneous the drainage clear bile was encountered under pressure. CT was acquired. Drain was repositioned in a more superficial location near the margin of the liver. Repeat CT was acquired. The drain was then sutured in position and attached to gravity bag. Proximally 100 cc bile was vacuum weighted. Sample was sent for culture. Patient tolerated the procedure well and remained hemodynamically stable throughout. No complications were encountered and no significant blood loss. IMPRESSION: Status post CT-guided drainage of right upper quadrant biloma. Signed, Yvone Neu.  Miachel Roux, RPVI [facility] Electronically Signed   By: Gilmer Mor D.O.   On: 10/28/2022 13:48   NM HEPATOBILIARY LEAK (POST-SURGICAL)  Result Date: 10/27/2022 CLINICAL DATA:  Postoperative abdominal pain, perihepatic fluid collection by CT question bile leak EXAM: NUCLEAR MEDICINE HEPATOBILIARY IMAGING TECHNIQUE: Sequential images of the abdomen were obtained out to 60 minutes following intravenous administration of radiopharmaceutical. RADIOPHARMACEUTICALS:  5.5 mCi Tc-28m  Choletec IV COMPARISON:  CT abdomen and pelvis 10/27/2022 FINDINGS: Normal tracer extraction from bloodstream indicating normal hepatocellular function. Prompt excretion of tracer into  biliary tree. Small bowel visualized at 14 minutes. Gallbladder surgically absent. Beginning at 11 minutes, abnormal tracer accumulates at the lateral margin of the liver corresponding to fluid collection on CT consistent with bile leak. IMPRESSION: Post cholecystectomy. Patent biliary tree with tracer into small bowel. Abnormal localization of tracer within fluid collection lateral to the RIGHT lobe of the liver consistent with bile leak. Findings called to Dr. Lovell Sheehan on 10/27/2022 at 1130 hours. Electronically Signed   By: Ulyses Southward M.D.   On: 10/27/2022 11:44   DG Chest Port 1 View  Result Date: 10/27/2022 CLINICAL DATA:  Shortness of breath, vomiting, fever, recent cholecystectomy EXAM: PORTABLE CHEST 1 VIEW COMPARISON:  10/17/2022 FINDINGS: Cardiac size is within normal limits. There are no signs of alveolar pulmonary edema. There is interval appearance of small right pleural effusion. Transverse linear density in right parahilar region may suggest subsegmental atelectasis. There is no pneumothorax. Degenerative changes are noted in left shoulder. IMPRESSION: There are no signs of alveolar pulmonary edema or focal pulmonary consolidation. Small right pleural effusion. Linear  density in right parahilar region may suggest subsegmental atelectasis. Electronically Signed   By: Ernie Avena M.D.   On: 10/27/2022 09:24   CT ABDOMEN PELVIS W CONTRAST  Result Date: 10/27/2022 CLINICAL DATA:  Postoperative abdominal pain. EXAM: CT ABDOMEN AND PELVIS WITH CONTRAST TECHNIQUE: Multidetector CT imaging of the abdomen and pelvis was performed using the standard protocol following bolus administration of intravenous contrast. RADIATION DOSE REDUCTION: This exam was performed according to the departmental dose-optimization program which includes automated exposure control, adjustment of the mA and/or kV according to patient size and/or use of iterative reconstruction technique. CONTRAST:  OMNIPAQUE IOHEXOL 300 MG/ML  SOLN COMPARISON:  October 17, 2022 FINDINGS: Lower chest: Mild to moderate severity areas of atelectasis are seen within the bilateral lung bases, right greater than left. A small right pleural effusion is noted. Hepatobiliary: A stable subcentimeter cyst is seen within the anterior aspect of the left lobe of the liver. Status post cholecystectomy. A moderate to marked amount of fluid and a small amount of air is seen within the gallbladder fossa. This extends along the anterior and lateral aspect of the right lobe of the liver. A 7.3 cm x 6.2 cm x 4.5 cm well-defined collection of fluid is also seen inferior to the right lobe of the liver (axial CT images 30 through 41, CT series 2). This a appears to connect to the gallbladder fossa (best seen on coronal reformatted images 37 through 40, CT series 6). No postoperative abscesses are identified. The common bile duct measures 12 mm in diameter. Pancreas: Unremarkable. No pancreatic ductal dilatation or surrounding inflammatory changes. Spleen: Normal in size without focal abnormality. Adrenals/Urinary Tract: Adrenal glands are unremarkable. Kidneys are normal, without renal calculi, focal lesion, or hydronephrosis. Bladder is  unremarkable. Stomach/Bowel: Stomach is within normal limits. Appendix appears normal. No evidence of bowel wall thickening, distention, or inflammatory changes. Noninflamed diverticula are seen throughout the proximal to mid sigmoid colon. Vascular/Lymphatic: Aortic atherosclerosis. No enlarged abdominal or pelvic lymph nodes. Reproductive: Uterus and bilateral adnexa are unremarkable. Other: No abdominal wall hernia or abnormality. No abdominopelvic ascites. Musculoskeletal: Multilevel degenerative changes seen throughout the lumbar spine. IMPRESSION: 1. Status post cholecystectomy with a marked amount of postoperative perihepatic and right upper quadrant fluid, as described above. 2. Sigmoid diverticulosis. 3. Mild to moderate severity bilateral basilar atelectasis, right greater than left. 4. Small right pleural effusion. 5. Aortic atherosclerosis. Aortic Atherosclerosis (ICD10-I70.0). Electronically  Signed   By: Aram Candela M.D.   On: 10/27/2022 00:23   DG Cholangiogram Operative  Result Date: 10/19/2022 CLINICAL DATA:  Intraoperative cholangiogram during laparoscopic cholecystectomy. EXAM: INTRAOPERATIVE CHOLANGIOGRAM FLUOROSCOPY TIME:  1 minute, 5 seconds COMPARISON:  Right upper quadrant abdominal ultrasound-10/17/2022; CT abdomen pelvis-10/17/2022 FINDINGS: Intraoperative cholangiographic images of the right upper abdominal quadrant during laparoscopic cholecystectomy are provided for review. Surgical clips overlie the expected location of the gallbladder fossa. Contrast injection demonstrates selective cannulation of the central aspect of the cystic duct. There is passage of contrast through the central aspect of the cystic duct with filling of a mildly dilated common bile duct. There is passage of contrast though the CBD and into the descending portion of the duodenum. There is minimal reflux of injected contrast into the common hepatic duct and central aspect of the non dilated intrahepatic  biliary system. There are no discrete filling defects within the opacified portions of the biliary system to suggest the presence of choledocholithiasis. IMPRESSION: No evidence of choledocholithiasis. Electronically Signed   By: Simonne Come M.D.   On: 10/19/2022 15:22   US Abdomen Limited RUQ (LIVER/GB)  Result Date: 10/17/2022 CLINICAL DATA:  Acute right upper quadrant abdominal pain. EXAM: ULTRASOUND ABDOMEN LIMITED RIGHT UPPER QUADRANT COMPARISON:  CT scan of same day. FINDINGS: Gallbladder: Multiple gallstones are noted, with 1 noted in the neck of the gallbladder. Severe gallbladder wall thickening is noted measuring 12 mm at 1 point. No sonographic Murphy's sign is noted. Common bile duct: Diameter: 8 mm which is mildly dilated. Liver: No focal lesion identified. Within normal limits in parenchymal echogenicity. Portal vein is patent on color Doppler imaging with normal direction of blood flow towards the liver. Other: None. IMPRESSION: Multiple gallstones are noted with at least 1 calculus noted in the neck of the gallbladder. Severe gallbladder wall thickening is noted. These findings are concerning for possible cholecystitis. Mild common bile duct dilatation is noted as described on CT scan of same day. Electronically Signed   By: Lupita Raider M.D.   On: 10/17/2022 12:49   CT ABDOMEN PELVIS W CONTRAST  Result Date: 10/17/2022 CLINICAL DATA:  Acute abdominal pain.  Worsening abdominal pain. EXAM: CT ABDOMEN AND PELVIS WITH CONTRAST TECHNIQUE: Multidetector CT imaging of the abdomen and pelvis was performed using the standard protocol following bolus administration of intravenous contrast. RADIATION DOSE REDUCTION: This exam was performed according to the departmental dose-optimization program which includes automated exposure control, adjustment of the mA and/or kV according to patient size and/or use of iterative reconstruction technique. CONTRAST:  OMNIPAQUE IOHEXOL 300 MG/ML  SOLN  COMPARISON:  None Available. FINDINGS: Lower chest: No acute abnormality. Questionable mass within the lower RIGHT breast, measuring approximately 1 cm greatest dimension (series 2, image 16). Hepatobiliary: No acute or suspicious findings within the liver. Sludge and/or stones are present within the nondistended gallbladder. Diffuse enhancement of the gallbladder walls. No pericholecystic fluid. Common bile duct measures approximately 1 cm diameter. No stone is identified within the common bile duct. Pancreas: Unremarkable. No pancreatic ductal dilatation or surrounding inflammatory changes. Spleen: Normal in size without focal abnormality. Adrenals/Urinary Tract: Adrenal glands appear normal. Kidneys are unremarkable without suspicious mass, stone or hydronephrosis. No ureteral or bladder calculi are identified. Bladder walls are circumferentially thickened, possibly accentuated to some degree by incomplete bladder distention. Stomach/Bowel: No dilated large or small bowel loops. No evidence of bowel wall inflammation. Appendix appears normal. Scattered diverticulosis of the sigmoid colon but no focal  inflammatory changes seen to suggest acute diverticulitis. Stomach is unremarkable, although partially decompressed limiting characterization. Vascular/Lymphatic: Aortic atherosclerosis. No acute-appearing vascular abnormality. Mildly prominent lymph nodes within the porta hepatis and portacaval spaces. No enlarged lymph nodes identified elsewhere in the abdomen or pelvis. Reproductive: Uterus and bilateral adnexa are unremarkable. Other: No free fluid or abscess collection is seen. No free intraperitoneal air. Musculoskeletal: Degenerative spondylosis of the slightly scoliotic thoracolumbar spine. No acute-appearing osseous abnormality. IMPRESSION: 1. Sludge and/or stones within the nondistended gallbladder. Diffuse enhancement of the gallbladder walls, but no pericholecystic fluid. Recommend right upper quadrant  ultrasound to evaluate for acute cholecystitis. 2. Common bile duct measures approximately 1 cm diameter. No stone is identified within the common bile duct. Recommend correlation with LFTs. If LFTs are elevated, would recommend MRCP for further characterization. This CBD dilatation can also be further characterized on the RIGHT upper quadrant ultrasound recommended above. 3. Bladder walls are circumferentially thickened, possibly accentuated to some degree by incomplete bladder distention. Recommend correlation with urinalysis to exclude cystitis. 4. Colonic diverticulosis without evidence of acute diverticulitis. 5. Questionable mass within the lower RIGHT breast, measuring approximately 1 cm greatest dimension. Recommend nonemergent diagnostic mammogram at a Breast Center for further characterization. 6. Mildly prominent lymph nodes within the porta hepatis and portacaval spaces, most likely reactive in nature. Aortic Atherosclerosis (ICD10-I70.0). Electronically Signed   By: Bary Richard M.D.   On: 10/17/2022 10:07   DG Abdomen Acute W/Chest  Result Date: 10/17/2022 CLINICAL DATA:  Acute abdominal pain. EXAM: DG ABDOMEN ACUTE WITH 1 VIEW CHEST COMPARISON:  None Available. FINDINGS: There is no evidence of dilated bowel loops or free intraperitoneal air. No radiopaque calculi or other significant radiographic abnormality is seen. Heart size and mediastinal contours are within normal limits. Both lungs are clear. IMPRESSION: No abnormal bowel dilatation.  No acute cardiopulmonary disease. Electronically Signed   By: Lupita Raider M.D.   On: 10/17/2022 09:12    Labs:  CBC: Recent Labs    10/26/22 2020 10/28/22 0421 10/31/22 0403 11/02/22 0347  WBC 6.3 6.4 5.9 7.7  HGB 9.4* 10.4* 10.8* 10.6*  HCT 28.3* 30.8* 33.8* 31.9*  PLT 243 298 439* 451*    COAGS: Recent Labs    10/18/22 0437  INR 1.2    BMP: Recent Labs    10/26/22 2020 10/28/22 0421 10/31/22 0403 11/02/22 0347  NA 134*  139 138 136  K 3.5 4.4 5.2* 4.1  CL 101 100 104 104  CO2 24 27 27 23   GLUCOSE 229* 125* 142* 142*  BUN 7* 5* 5* 8  CALCIUM 7.9* 8.2* 8.2* 8.2*  CREATININE 0.94 0.91 0.91 0.93  GFRNONAA >60 >60 >60 >60    LIVER FUNCTION TESTS: Recent Labs    10/26/22 2020 10/28/22 0421 10/31/22 0403 11/02/22 0347  BILITOT 0.4 0.5 0.5 0.4  AST 15 12* 21 17  ALT 20 19 23 21   ALKPHOS 98 108 105 98  PROT 6.1* 5.7* 6.1* 5.5*  ALBUMIN 2.7* 2.3* 2.7* 2.5*    Assessment and Plan:  CT today demonstrates significant improvement in perihepatic fluid compared to prior to catheter drainage. The dominant component of biloma at the level of the gallbladder fossa and just inferior to the liver is completely drained by the indwelling percutaneous drain. Small amount of perihepatic fluid remains present along the inferolateral margin of the liver measuring up to roughly 2.0 x 5.8 cm. Small loculation of fluid inferior and lateral to the pigtail portion of the indwelling  drain measures roughly 2.1 x 3.5 cm in transverse dimensions.  The small residual areas of fluid should resolve.  Additional injection of the biloma drain prior to removal demonstrates no opacification of bile ducts and no significant biloma cavity.  The drainage catheter was removed without difficulty today.  Ms. Hillier will follow-up with Dr. Henreitta Leber next week.   Electronically Signed: Reola Calkins 11/13/2022, 2:24 PM    I spent a total of 15 Minutes in face to face in clinical consultation, greater than 50% of which was counseling/coordinating care post catheter drainage of biloma.

## 2022-11-13 NOTE — Progress Notes (Signed)
Patient's drain removed. Will plan to follow up as previously scheduled. Let her know to call us with any issues.

## 2022-11-17 ENCOUNTER — Encounter: Payer: Self-pay | Admitting: General Surgery

## 2022-11-17 ENCOUNTER — Ambulatory Visit (INDEPENDENT_AMBULATORY_CARE_PROVIDER_SITE_OTHER): Payer: 59 | Admitting: General Surgery

## 2022-11-17 VITALS — BP 116/79 | HR 64 | Temp 97.8°F | Resp 14 | Ht 63.0 in | Wt 149.0 lb

## 2022-11-17 DIAGNOSIS — K838 Other specified diseases of biliary tract: Secondary | ICD-10-CM

## 2022-11-17 DIAGNOSIS — K851 Biliary acute pancreatitis without necrosis or infection: Secondary | ICD-10-CM

## 2022-11-17 DIAGNOSIS — K9189 Other postprocedural complications and disorders of digestive system: Secondary | ICD-10-CM

## 2022-11-17 NOTE — Patient Instructions (Signed)
Will need to get your stent out with Dr. Leone Payor in a few months. I will send him a message that your IR drain is out. Call if you have issues or if you do not hear from Dr. Marvell Fuller office.

## 2022-11-17 NOTE — Progress Notes (Signed)
[facility]  Looking good. IR drain removed from biloma, no leak. ERCP stent in place by Dr. Leone Payor. She does not have follow up yet.  No complaints. No pain.   BP 116/79   Pulse 64   Temp 97.8 F (36.6 C) (Oral)   Resp 14   Ht 5\' 3"  (1.6 m)   Wt 149 lb (67.6 kg)   SpO2 94%   BMI 26.39 kg/m  Soft, nondistended, nontender, port sites all healed  Patient s/p laparoscopic cholecystectomy IOC for gallstone pancreatitis. Then with bile leak s/p IR drain and ERCP stent. IR drain out.   Doing well. Feeling good.  Will need to get your stent out with Dr. Leone Payor in a few months. I will send him a message that your IR drain is out. Call if you have issues or if you do not hear from Dr. Marvell Fuller office.   Algis Greenhouse, MD [facility] [address] [place], [place] [address] [phone] (office)

## 2022-11-18 ENCOUNTER — Emergency Department (HOSPITAL_COMMUNITY)
Admission: EM | Admit: 2022-11-18 | Discharge: 2022-11-18 | Payer: 59 | Attending: Emergency Medicine | Admitting: Emergency Medicine

## 2022-11-18 ENCOUNTER — Emergency Department (HOSPITAL_COMMUNITY): Payer: 59

## 2022-11-18 ENCOUNTER — Other Ambulatory Visit: Payer: Self-pay

## 2022-11-18 ENCOUNTER — Encounter (HOSPITAL_COMMUNITY): Payer: Self-pay | Admitting: *Deleted

## 2022-11-18 DIAGNOSIS — Z794 Long term (current) use of insulin: Secondary | ICD-10-CM | POA: Insufficient documentation

## 2022-11-18 DIAGNOSIS — R1084 Generalized abdominal pain: Secondary | ICD-10-CM | POA: Diagnosis present

## 2022-11-18 DIAGNOSIS — R141 Gas pain: Secondary | ICD-10-CM | POA: Diagnosis not present

## 2022-11-18 LAB — CBC WITH DIFFERENTIAL/PLATELET
Abs Immature Granulocytes: 0.03 10*3/uL (ref 0.00–0.07)
Basophils Absolute: 0 10*3/uL (ref 0.0–0.1)
Basophils Relative: 0 %
Eosinophils Absolute: 0.4 10*3/uL (ref 0.0–0.5)
Eosinophils Relative: 6 %
HCT: 35.2 % — ABNORMAL LOW (ref 36.0–46.0)
Hemoglobin: 11.3 g/dL — ABNORMAL LOW (ref 12.0–15.0)
Immature Granulocytes: 0 %
Lymphocytes Relative: 27 %
Lymphs Abs: 1.8 10*3/uL (ref 0.7–4.0)
MCH: 30.8 pg (ref 26.0–34.0)
MCHC: 32.1 g/dL (ref 30.0–36.0)
MCV: 95.9 fL (ref 80.0–100.0)
Monocytes Absolute: 0.6 10*3/uL (ref 0.1–1.0)
Monocytes Relative: 8 %
Neutro Abs: 3.9 10*3/uL (ref 1.7–7.7)
Neutrophils Relative %: 59 %
Platelets: 285 10*3/uL (ref 150–400)
RBC: 3.67 MIL/uL — ABNORMAL LOW (ref 3.87–5.11)
RDW: 13.3 % (ref 11.5–15.5)
WBC: 6.8 10*3/uL (ref 4.0–10.5)
nRBC: 0 % (ref 0.0–0.2)

## 2022-11-18 LAB — URINALYSIS, ROUTINE W REFLEX MICROSCOPIC
Bilirubin Urine: NEGATIVE
Glucose, UA: NEGATIVE mg/dL
Hgb urine dipstick: NEGATIVE
Ketones, ur: NEGATIVE mg/dL
Leukocytes,Ua: NEGATIVE
Nitrite: NEGATIVE
Protein, ur: NEGATIVE mg/dL
Specific Gravity, Urine: 1.005 (ref 1.005–1.030)
pH: 7 (ref 5.0–8.0)

## 2022-11-18 LAB — COMPREHENSIVE METABOLIC PANEL WITH GFR
ALT: 13 U/L (ref 0–44)
AST: 13 U/L — ABNORMAL LOW (ref 15–41)
Albumin: 3.4 g/dL — ABNORMAL LOW (ref 3.5–5.0)
Alkaline Phosphatase: 95 U/L (ref 38–126)
Anion gap: 11 (ref 5–15)
BUN: 10 mg/dL (ref 8–23)
CO2: 28 mmol/L (ref 22–32)
Calcium: 8.8 mg/dL — ABNORMAL LOW (ref 8.9–10.3)
Chloride: 98 mmol/L (ref 98–111)
Creatinine, Ser: 0.89 mg/dL (ref 0.44–1.00)
GFR, Estimated: >60 mL/min
Glucose, Bld: 90 mg/dL (ref 70–99)
Potassium: 3.5 mmol/L (ref 3.5–5.1)
Sodium: 137 mmol/L (ref 135–145)
Total Bilirubin: 0.5 mg/dL (ref 0.3–1.2)
Total Protein: 6.8 g/dL (ref 6.5–8.1)

## 2022-11-18 LAB — LIPASE, BLOOD: Lipase: 41 U/L (ref 11–51)

## 2022-11-18 MED ORDER — IOHEXOL 9 MG/ML PO SOLN
ORAL | Status: AC
  Filled 2022-11-18: qty 1000

## 2022-11-18 MED ORDER — SODIUM CHLORIDE 0.9 % IV BOLUS
1000.0000 mL | Freq: Once | INTRAVENOUS | Status: AC
  Administered 2022-11-18: 1000 mL via INTRAVENOUS

## 2022-11-18 MED ORDER — SIMETHICONE 125 MG PO CHEW: 125.0000 mg | CHEWABLE_TABLET | Freq: Four times a day (QID) | ORAL | 0 refills | Status: AC | PRN

## 2022-11-18 MED ORDER — IOHEXOL 300 MG/ML  SOLN
100.0000 mL | Freq: Once | INTRAMUSCULAR | Status: AC | PRN
  Administered 2022-11-18: 100 mL via INTRAVENOUS

## 2022-11-18 NOTE — ED Notes (Signed)
Per [place], chelsea is to pick pt up, an employee at the [employer]

## 2022-11-18 NOTE — ED Triage Notes (Signed)
Pt c/o right upper abdominal pain that start this am; pt states she had her gallbladder removed 4/15 and states 3 days after the surgery she had to have a JP drain placed due to drainage.  Pt states she had it removed Friday  Pt states she has not had a BM in a week and takes miralax and stool softeners daily; but pt states some times she does goes a week before she has a BM  Pt has very dry mucous membranes and tenting to skin  Pt states her abdomen is distended and she has some nausea

## 2022-11-18 NOTE — ED Provider Notes (Signed)
[place] [facility] DE[facility]MENT AT [facility] Provider Note   CSN: [id number] Arrival date & time: 11/18/22  1024     History  Chief Complaint  Patient presents with   Abdominal Pain    TAMATHA [name] is a 67 y.o. female.   Abdominal Pain Associated symptoms: constipation and nausea   Associated symptoms: no chest pain, no diarrhea, no dysuria, no fever, no shortness of breath and no vomiting         [place] [name] is a 67 y.o. female status post cholecystectomy on 10/19/2022, 3 days after surgery required placement of JP drain.  Drain was removed 5 days ago.  Patient here complaining of diffuse abdominal bloating and upper abdominal pain.  Last BM 5 days ago.  States she has not had a significant bowel movement since her surgery.  She has been taking MiraLAX daily along with 2 stool softeners without relief.  She notes some flatus but reports its minimal.  Mild nausea but no vomiting, fever, chills, chest pain or shortness of breath.    Home Medications Prior to Admission medications   Medication Sig Start Date End Date Taking? Authorizing Provider  albuterol (VENTOLIN HFA) 108 (90 Base) MCG/ACT inhaler Inhale 2 puffs into the lungs every 8 (eight) hours as needed for wheezing or shortness of breath. 03/19/22   [provider]  alendronate (FOSAMAX) 70 MG tablet Take 70 mg by mouth every Monday. 08/07/22   [provider]  Blood Glucose Monitoring Suppl DEVI 1 each by Does not apply route in the morning, at noon, and at bedtime. May substitute to any manufacturer covered by patient's insurance. 11/02/22   Lanae Boast, MD  calcium carbonate (TUMS EX) 750 MG chewable tablet Chew 1 tablet by mouth 3 (three) times daily as needed for heartburn (and/or indigestion).    [provider]  cloNIDine (CATAPRES) 0.2 MG tablet Take 1 tablet (0.2 mg total) by mouth 2 (two) times daily. 10/31/22 01/29/23  Pokhrel, Rebekah [place], MD  cyanocobalamin (VITAMIN B12)  1000 MCG tablet Take 3,000 mcg by mouth daily.    [provider]  divalproex (DEPAKOTE SPRINKLE) 125 MG capsule Take 125 mg by mouth 2 (two) times daily.    [provider]  doxepin (SINEQUAN) 25 MG capsule Take by mouth. 06/13/19   [provider]  gabapentin (NEURONTIN) 600 MG tablet Take 600 mg by mouth 3 (three) times daily. 07/09/22   [provider]  Glucose Blood (BLOOD GLUCOSE TEST STRIPS) STRP 1 each by In Vitro route in the morning, at noon, and at bedtime. May substitute to any manufacturer covered by patient's insurance. 11/02/22 12/02/22  Lanae Boast, MD  guaiFENesin (ROBITUSSIN) 100 MG/5ML liquid Take 5 mLs by mouth every 8 (eight) hours as needed for cough or to loosen phlegm.    [provider]  ibuprofen (ADVIL) 400 MG tablet Take 400 mg by mouth every 6 (six) hours as needed. 10/15/22   [provider]  lactulose (CEPHULAC) 10 g packet Take by mouth. 05/25/18   [provider]  Lancet Device MISC 1 each by Does not apply route in the morning, at noon, and at bedtime. May substitute to any manufacturer covered by patient's insurance. 11/02/22 12/02/22  Lanae Boast, MD  Lancets Misc. MISC 1 each by Does not apply route in the morning, at noon, and at bedtime. May substitute to any manufacturer covered by patient's insurance. 11/02/22 12/02/22  Lanae Boast, MD  levothyroxine (SYNTHROID) 88 MCG tablet  Take 88 mcg by mouth daily before breakfast.    [provider]  Liniments (V-R PAIN RELIEVING) GEL Apply 1 Application topically 2 (two) times daily after a meal. Applied to lower back as directed    [provider]  lisinopril (ZESTRIL) 30 MG tablet Take 1 tablet by mouth daily. 06/13/19   [provider]  metFORMIN (GLUCOPHAGE-XR) 500 MG 24 hr tablet Take 1 tablet (500 mg total) by mouth 2 (two) times daily with a meal. 11/02/22 12/02/22  Lanae Boast, MD  nystatin (MYCOSTATIN) 100000 UNIT/ML suspension Take 5 mLs by  mouth 4 (four) times daily. 10 DAY COURSE STARTING ON 10/12/22 AND TO BE COMPLETED ON 10/22/2022    [provider]  omeprazole (PRILOSEC) 20 MG capsule Take 1 capsule by mouth daily. 06/13/19   [provider]  ondansetron (ZOFRAN) 4 MG tablet Take 1 tablet (4 mg total) by mouth every 6 (six) hours as needed for nausea. 10/20/22   Lucretia Roers, MD  oxyCODONE (OXY IR/ROXICODONE) 5 MG immediate release tablet Take 1 tablet (5 mg total) by mouth every 4 (four) hours as needed for severe pain or breakthrough pain. 11/03/22   Lucretia Roers, MD  polyethylene glycol (MIRALAX / GLYCOLAX) 17 g packet Take 17 g by mouth daily. 11/01/22   Pokhrel, Rebekah [place], MD  pravastatin (PRAVACHOL) 40 MG tablet Take 40 mg by mouth daily. 07/06/22   [provider]  psyllium (REGULOID) 0.52 g capsule Take 0.52 g by mouth at bedtime.    [provider]  ramelteon (ROZEREM) 8 MG tablet Take 8 mg by mouth at bedtime.    [provider]  tiZANidine (ZANAFLEX) 4 MG tablet Take 4 mg by mouth every 8 (eight) hours as needed for muscle spasms.    [provider]  topiramate (TOPAMAX) 50 MG tablet Take by mouth. 06/13/19   [provider]  Dwyane Luo 200-62.5-25 MCG/ACT AEPB Inhale 1 puff into the lungs daily. *RINSE MOUTH WITH WATER AND SPIT DAILY 08/06/22   [provider]  venlafaxine XR (EFFEXOR-XR) 75 MG 24 hr capsule Take by mouth. 06/13/19   [provider]      Allergies    Patient has no known allergies.    Review of Systems   Review of Systems  Constitutional:  Negative for appetite change and fever.  Respiratory:  Negative for shortness of breath.   Cardiovascular:  Negative for chest pain.  Gastrointestinal:  Positive for abdominal pain, constipation and nausea. Negative for diarrhea, rectal pain and vomiting.  Genitourinary:  Negative for dysuria and flank pain.  Musculoskeletal:  Negative for back pain.  Skin:  Negative for color  change, pallor and rash.  Neurological:  Negative for weakness and numbness.    Physical Exam Updated Vital Signs BP (!) 144/84 (BP Location: Left Arm)   Pulse 65   Temp 98.1 F (36.7 C) (Oral)   Resp 16   Ht 5\' 3"  (1.6 m)   Wt 67.6 kg   SpO2 94%   BMI 26.39 kg/m  Physical Exam Vitals and nursing note reviewed.  Constitutional:      General: She is not in acute distress.    Appearance: She is well-developed. She is not ill-appearing or toxic-appearing.  HENT:     Mouth/Throat:     Mouth: Mucous membranes are dry.  Eyes:     Conjunctiva/sclera: Conjunctivae normal.  Cardiovascular:     Rate and Rhythm: Normal rate and regular rhythm.  Pulses: Normal pulses.  Pulmonary:     Effort: Pulmonary effort is normal.  Abdominal:     General: There is distension.     Tenderness: There is no right CVA tenderness, left CVA tenderness, guarding or rebound.     Comments: Abdomen appears distended and tympanitic.  Mild tenderness along the epigastric region.  No guarding or rebound tenderness.  Musculoskeletal:        General: Normal range of motion.     Right lower leg: No edema.     Left lower leg: No edema.  Skin:    General: Skin is warm.     Capillary Refill: Capillary refill takes less than 2 seconds.  Neurological:     General: No focal deficit present.     Mental Status: She is alert.     Sensory: No sensory deficit.     Motor: No weakness.     ED Results / Procedures / Treatments   Labs (all labs ordered are listed, but only abnormal results are displayed) Labs Reviewed  COMPREHENSIVE METABOLIC PANEL - Abnormal; Notable for the following components:      Result Value   Calcium 8.8 (*)    Albumin 3.4 (*)    AST 13 (*)    All other components within normal limits  CBC WITH DIFFERENTIAL/PLATELET - Abnormal; Notable for the following components:   RBC 3.67 (*)    Hemoglobin 11.3 (*)    HCT 35.2 (*)    All other components within normal limits  URINALYSIS,  ROUTINE W REFLEX MICROSCOPIC - Abnormal; Notable for the following components:   Color, Urine COLORLESS (*)    All other components within normal limits  LIPASE, BLOOD    EKG None  Radiology CT ABDOMEN PELVIS W CONTRAST  Result Date: 11/18/2022 CLINICAL DATA:  Status post cholecystectomy. EXAM: CT ABDOMEN AND PELVIS WITH CONTRAST TECHNIQUE: Multidetector CT imaging of the abdomen and pelvis was performed using the standard protocol following bolus administration of intravenous contrast. RADIATION DOSE REDUCTION: This exam was performed according to the departmental dose-optimization program which includes automated exposure control, adjustment of the mA and/or kV according to patient size and/or use of iterative reconstruction technique. CONTRAST:  OMNIPAQUE IOHEXOL 300 MG/ML  SOLN COMPARISON:  Multiple recent examinations including most recent dated Nov 13, 2022. FINDINGS: Lower chest: No acute abnormality. Hepatobiliary: Interval removal of the right upper quadrant drain. Fluid collection about the lateral aspect of the liver measuring 4.7 x 1.7 x 3.8 cm suggesting small biloma, has slightly decreased since prior examination. Cholecystectomy changes and mild fat stranding in the gallbladder fossa has improved since prior examination. Interval resolution of the fluid collection in the gallbladder fossa. Pancreas: Unremarkable. No pancreatic ductal dilatation or surrounding inflammatory changes. Spleen: Normal in size without focal abnormality. Adrenals/Urinary Tract: Adrenal glands are unremarkable. Kidneys are normal, without renal calculi, focal lesion, or hydronephrosis. Bladder is unremarkable. Stomach/Bowel: Stomach is within normal limits. Appendix appears normal. No evidence of bowel wall thickening, distention, or inflammatory changes. Vascular/Lymphatic: Aortic atherosclerosis. No enlarged abdominal or pelvic lymph nodes. Reproductive: Uterus and bilateral adnexa are unremarkable. Other:  No abdominal wall hernia or abnormality. No abdominopelvic ascites. Musculoskeletal: Degenerate disc disease of the lumbar spine. No acute osseous abnormality. IMPRESSION: 1. Cholecystectomy changes and mild fat stranding in the gallbladder fossa has improved since prior examination. Interval resolution of the fluid collection in the gallbladder fossa. 2. Interval removal of the right upper quadrant drain. Fluid collection about the lateral aspect of  the liver measuring 4.7 x 1.7 x 3.8 cm suggesting small biloma, has slightly decreased from prior examination. 3. Bowel loops are normal in caliber. No evidence of colitis or diverticulitis. 4. No evidence of nephrolithiasis or hydronephrosis. 5. Aortic atherosclerosis. Electronically Signed   By: Larose Hires D.O.   On: 11/18/2022 13:50    Procedures Procedures    Medications Ordered in ED Medications - No data to display  ED Course/ Medical Decision Making/ A&P                             Medical Decision Making Patient here status post 1 month out from cholecystectomy.  She had JP drain and that was removed 5 days ago.  Denies bowel movement for 5 days despite taking stool softeners and MiraLAX.  Reports her abdomen feels swollen small amount of flatus.  She denies any fever or vomiting.  Differential would include but not limited to ileus, SBO, constipation, diverticulitis, postsurgical complication  Amount and/or Complexity of Data Reviewed Labs: ordered.    Details: Labs unremarkable Radiology: ordered.    Details: CT abdomen and pelvis shows cholecystectomy changes with fat stranding of the gallbladder fossa improved from prior exam.  Small biloma decreased from prior exam no evidence of colitis or diverticulitis Discussion of management or test interpretation with external provider(s): Patient here with complaint of abdominal distention and constipation 1 month status postcholecystectomy.  No fever chills nausea or vomiting.  Endorses  last bowel movement 5 days ago.  Consulted with radiologist that read CT abdomen pelvis for further guidance of possible ileus versus constipation.  Stool is seen within the colon along with gas but no significant stool burden seen.  No CT evidence of ileus  Risk OTC drugs. Prescription drug management. Risk Details:   Overall workup without evidence of emergent process.  Symptoms felt to be related to gas of the colon and possible mild constipation although no significant stool burden or impaction seen.  CT without evidence of diverticulitis, SBO or ileus.  Patient well-appearing agreeable to close outpatient follow-up.  I recommended simethicone.  Return precautions given.           Final Clinical Impression(s) / ED Diagnoses Final diagnoses:  Generalized abdominal pain  Gas pain    Rx / DC Orders ED Discharge Orders     None         Pauline Aus, PA-C 11/20/22 1304    Derwood Kaplan, MD 11/20/22 1513

## 2022-11-18 NOTE — ED Notes (Signed)
Pt given a snack and some water as pt is allowed to eat and drink per PA-C

## 2022-11-18 NOTE — Discharge Instructions (Addendum)
You may continue with the MiraLAX, 1 heaping capful mixed in 8 to 10 ounces of water or juice and you can increase to twice daily if needed.  I have recommended simethicone as directed to help with your intestinal gas.  Please follow-up with your primary care provider for recheck or return to the emergency department for any new or worsening symptoms.

## 2022-11-19 ENCOUNTER — Telehealth: Payer: Self-pay | Admitting: Internal Medicine

## 2022-11-19 DIAGNOSIS — Z4689 Encounter for fitting and adjustment of other specified devices: Secondary | ICD-10-CM

## 2022-11-19 NOTE — Telephone Encounter (Signed)
   Staff message from patient's surgeon:  Lucretia Roers, MD  Iva Boop, MD Thanks so much for stenting Ms. Pesnell. She has her IR drain out as of last week. I told her you all usually remove the stent in 3 months or so. She has not heard from anyone about follow up with you all but I told her that could be due to the distant timing.  Overall she is doing good and looks good. Let me know if there is anything I can do or if issues with the ERCP.  Algis Greenhouse, MD [facility] [address] [place] [place], [place] [address] [phone] (office)  Patient needs to be scheduled for ERCP at [facility], try 12/31/2022.  Encounter Diagnosis  Name Primary?   Encounter for removal of biliary stent Yes   Please order Unasyn 1.5 g IV on call and diclofenac 100 mg per rectum for the procedure this is contained in the orders.

## 2022-11-20 ENCOUNTER — Other Ambulatory Visit: Payer: Self-pay | Admitting: Nurse Practitioner

## 2022-11-20 DIAGNOSIS — Z4689 Encounter for fitting and adjustment of other specified devices: Secondary | ICD-10-CM

## 2022-11-20 NOTE — Telephone Encounter (Signed)
I spoke to [place] and she request I mail her the instructions. Transportation is an issue for her, confirmed her address. She lives in an assisted living facility. She will either call me back or get the RN's there to help her prep she said. She is set up for 12/31/2022 at [facility] at 8:45AM, arrive at 7:15AM.

## 2022-11-24 ENCOUNTER — Other Ambulatory Visit: Payer: Self-pay | Admitting: Internal Medicine

## 2022-11-24 DIAGNOSIS — Z1231 Encounter for screening mammogram for malignant neoplasm of breast: Secondary | ICD-10-CM

## 2022-12-15 ENCOUNTER — Ambulatory Visit
Admission: RE | Admit: 2022-12-15 | Discharge: 2022-12-15 | Disposition: A | Discharge status: Home / Self Care | Payer: 59 | Source: Ambulatory Visit | Attending: Internal Medicine | Admitting: Internal Medicine

## 2022-12-15 ENCOUNTER — Other Ambulatory Visit: Payer: Self-pay | Admitting: Internal Medicine

## 2022-12-15 ENCOUNTER — Inpatient Hospital Stay: Admission: RE | Admit: 2022-12-15 | Payer: 59 | Source: Ambulatory Visit

## 2022-12-15 DIAGNOSIS — N63 Unspecified lump in unspecified breast: Secondary | ICD-10-CM

## 2022-12-15 DIAGNOSIS — Z1231 Encounter for screening mammogram for malignant neoplasm of breast: Secondary | ICD-10-CM

## 2022-12-18 ENCOUNTER — Other Ambulatory Visit: Payer: 59

## 2022-12-22 NOTE — Progress Notes (Signed)
Lives in assisted living facility, transportation is an issue.  COVID Vaccine Completed:  Date of COVID positive in last 90 days:  PCP - Florentina Jenny, MD Cardiologist -   Chest x-ray - 10/27/22 Epic EKG -  Stress Test -  ECHO -  Cardiac Cath -  Pacemaker/ICD device last checked: Spinal Cord Stimulator:  Bowel Prep -   Sleep Study -  CPAP -   Fasting Blood Sugar -  Checks Blood Sugar _____ times a day  Last dose of GLP1 agonist-  N/A GLP1 instructions:  N/A   Last dose of SGLT-2 inhibitors-  N/A SGLT-2 instructions: N/A   Blood Thinner Instructions:  Time Aspirin Instructions: Last Dose:  Activity level:  Can go up a flight of stairs and perform activities of daily living without stopping and without symptoms of chest pain or shortness of breath.  Able to exercise without symptoms  Unable to go up a flight of stairs without symptoms of     Anesthesia review:   Patient denies shortness of breath, fever, cough and chest pain at PAT appointment  Patient verbalized understanding of instructions that were given to them at the PAT appointment. Patient was also instructed that they will need to review over the PAT instructions again at home before surgery.

## 2022-12-23 ENCOUNTER — Other Ambulatory Visit: Payer: Self-pay

## 2022-12-23 ENCOUNTER — Encounter (HOSPITAL_COMMUNITY): Payer: Self-pay | Admitting: Internal Medicine

## 2022-12-23 NOTE — Progress Notes (Signed)
  Lives in assisted living facility, transportation is an issue.  Has transportation arranged   COVID Vaccine Completed:  No   Date of COVID positive in last 90 days:  No   PCP - Florentina Jenny, MD Cardiologist - N/A   Chest x-ray - 10/27/22 Epic EKG -  N/A Stress Test -  N/A ECHO -  N/A Cardiac Cath -  N/A Pacemaker/ICD device last checked: Spinal Cord Stimulator: N/A   Bowel Prep -  N/A   Sleep Study -  N/A CPAP -    Fasting Blood Sugar - 98 and 125 Checks Blood Sugar - 2  times a day   Last dose of GLP1 agonist-  N/A GLP1 instructions:  N/A   Last dose of SGLT-2 inhibitors-  N/A SGLT-2 instructions: N/A    Blood Thinner Instructions:   N/A Aspirin Instructions: Last Dose:   Activity level:   Can go up a flight of stairs and perform activities of daily living without stopping and without symptoms of chest pain or shortness of breath.   Able to exercise without symptoms   Unable to go up a flight of stairs without symptoms of                         Anesthesia review:  COPD, no change since prior procedure on 10-30-22   Patient denies shortness of breath, fever, cough and chest pain at PAT appointment (completed over the phone)   Patient verbalized understanding of instructions that were given to them at the PAT appointment. Patient was also instructed that they will need to review over the PAT instructions again at home before surgery.

## 2022-12-23 NOTE — Progress Notes (Signed)
Attempted to obtain medical history via telephone, unable to reach at this time. HIPAA compliant voicemail message left requesting return call to pre surgical testing department. 

## 2022-12-24 ENCOUNTER — Ambulatory Visit: Payer: 59

## 2022-12-24 ENCOUNTER — Ambulatory Visit
Admission: RE | Admit: 2022-12-24 | Discharge: 2022-12-24 | Disposition: A | Discharge status: Home / Self Care | Payer: 59 | Source: Ambulatory Visit | Attending: Internal Medicine | Admitting: Internal Medicine

## 2022-12-24 DIAGNOSIS — N63 Unspecified lump in unspecified breast: Secondary | ICD-10-CM

## 2022-12-30 ENCOUNTER — Encounter (HOSPITAL_COMMUNITY): Payer: Self-pay | Admitting: Anesthesiology

## 2022-12-30 NOTE — Anesthesia Preprocedure Evaluation (Signed)
Anesthesia Evaluation    Reviewed: Allergy & Precautions, Patient's Chart, lab work & pertinent test results  History of Anesthesia Complications Negative for: history of anesthetic complications  Airway Mallampati: III  TM Distance: >3 FB Neck ROM: Full    Dental  (+) Dental Advisory Given, Missing,    Pulmonary neg shortness of breath, COPD,  COPD inhaler, neg recent URI, Current Smoker and Patient abstained from smoking.   Pulmonary exam normal        Cardiovascular hypertension, Pt. on medications (-) angina (-) Past MI and (-) CHF Normal cardiovascular exam     Neuro/Psych  PSYCHIATRIC DISORDERS Anxiety     negative neurological ROS     GI/Hepatic ,GERD  ,,Bile leak post cholecystectomy   Endo/Other  diabetesHypothyroidism    Renal/GU      Musculoskeletal   Abdominal   Peds  Hematology  (+) Blood dyscrasia, anemia Lab Results      Component                Value               Date                      WBC                      6.4                 10/28/2022                HGB                      10.4 (L)            10/28/2022                HCT                      30.8 (L)            10/28/2022                MCV                      93.6                10/28/2022                PLT                      298                 10/28/2022              Anesthesia Other Findings NKDA  Reproductive/Obstetrics                              Anesthesia Physical Anesthesia Plan  ASA: 2  Anesthesia Plan: General   Post-op Pain Management: Minimal or no pain anticipated   Induction: Intravenous  PONV Risk Score and Plan: 2 and Ondansetron, Dexamethasone and Treatment may vary due to age or medical condition  Airway Management Planned: Oral ETT  Additional Equipment: None  Intra-op Plan:   Post-operative Plan: Extubation in OR  Informed Consent:      Dental advisory  given  Plan Discussed with: CRNA  Anesthesia Plan Comments:  Anesthesia Quick Evaluation  

## 2022-12-31 ENCOUNTER — Telehealth: Payer: Self-pay

## 2022-12-31 ENCOUNTER — Ambulatory Visit (HOSPITAL_COMMUNITY): Admission: RE | Admit: 2022-12-31 | Payer: 59 | Source: Home / Self Care | Admitting: Internal Medicine

## 2022-12-31 DIAGNOSIS — K851 Biliary acute pancreatitis without necrosis or infection: Secondary | ICD-10-CM

## 2022-12-31 DIAGNOSIS — K838 Other specified diseases of biliary tract: Secondary | ICD-10-CM

## 2022-12-31 DIAGNOSIS — K8000 Calculus of gallbladder with acute cholecystitis without obstruction: Secondary | ICD-10-CM

## 2022-12-31 DIAGNOSIS — K7689 Other specified diseases of liver: Secondary | ICD-10-CM

## 2022-12-31 HISTORY — DX: Chronic obstructive pulmonary disease, unspecified: J44.9

## 2022-12-31 HISTORY — DX: Type 2 diabetes mellitus without complications: E11.9

## 2022-12-31 SURGERY — ERCP, WITH INTERVENTION IF INDICATED
Anesthesia: General

## 2022-12-31 MED ORDER — CIPROFLOXACIN IN D5W 400 MG/200ML IV SOLN
INTRAVENOUS | Status: AC
  Filled 2022-12-31: qty 200

## 2022-12-31 MED ORDER — FENTANYL CITRATE (PF) 100 MCG/2ML IJ SOLN
INTRAMUSCULAR | Status: AC
  Filled 2022-12-31: qty 2

## 2022-12-31 MED ORDER — DICLOFENAC SUPPOSITORY 100 MG
RECTAL | Status: AC
  Filled 2022-12-31: qty 1

## 2022-12-31 MED ORDER — PROPOFOL 10 MG/ML IV BOLUS
INTRAVENOUS | Status: AC
  Filled 2022-12-31: qty 20

## 2022-12-31 MED ORDER — GLUCAGON HCL RDNA (DIAGNOSTIC) 1 MG IJ SOLR
INTRAMUSCULAR | Status: AC
  Filled 2022-12-31: qty 1

## 2022-12-31 NOTE — Telephone Encounter (Signed)
I need her to get LFT's to check status of stent and will decide how to proceed after I see those results  Explain that we have extremely limited access to these type of appointments and I am not sure when we can reschedule for - right now - possibly September - depending upon the LFT results  The stent has to come out at some point or it could lead to life-threatening infection - very rare but if she gets fever, pain, jaundice - she needs to go to ED urgently   Please route back an update

## 2022-12-31 NOTE — Telephone Encounter (Signed)
Left message message for patient to return call to further discuss plan.  Will continue efforts.

## 2022-12-31 NOTE — Telephone Encounter (Signed)
Patient called to the office this morning to report that she would not be attending ERCP scheduled for today at [facility].  She stated she got lost trying to find the hospital early this morning and has returned home to [place].  She is asking that she be rescheduled.   [facility] unit has been notified.

## 2022-12-31 NOTE — Telephone Encounter (Signed)
Patient returned call to the office.  I explained to the patient that she would need to have lab work to check the status of the stent and plan would be decided after results are received.  I also explained to the patient the importance of the removal of the stent due to the fact it could lead to life-threatening infection.  Patient advised should she experience fever, pain, jaundice, she should proceed to nearest ED urgently.  Patient was reminded of the limited access to procedures at the hospital and it is very important to attend the procedure when she is rescheduled.  Patient agreed to LFT's, but stated it would be next week before she could make it to the office as she has to arrange for transportation 5 days in advance.    Patient agreed to plan and verbalized understanding.  No further questions.

## 2023-01-06 ENCOUNTER — Other Ambulatory Visit (INDEPENDENT_AMBULATORY_CARE_PROVIDER_SITE_OTHER): Payer: 59

## 2023-01-06 ENCOUNTER — Telehealth: Payer: Self-pay | Admitting: Internal Medicine

## 2023-01-06 DIAGNOSIS — K7689 Other specified diseases of liver: Secondary | ICD-10-CM

## 2023-01-06 DIAGNOSIS — K8000 Calculus of gallbladder with acute cholecystitis without obstruction: Secondary | ICD-10-CM

## 2023-01-06 DIAGNOSIS — K9189 Other postprocedural complications and disorders of digestive system: Secondary | ICD-10-CM | POA: Diagnosis not present

## 2023-01-06 DIAGNOSIS — K851 Biliary acute pancreatitis without necrosis or infection: Secondary | ICD-10-CM | POA: Diagnosis not present

## 2023-01-06 DIAGNOSIS — Z4689 Encounter for fitting and adjustment of other specified devices: Secondary | ICD-10-CM

## 2023-01-06 DIAGNOSIS — K838 Other specified diseases of biliary tract: Secondary | ICD-10-CM

## 2023-01-06 LAB — HEPATIC FUNCTION PANEL
ALT: 16 U/L (ref 0–35)
AST: 13 U/L (ref 0–37)
Albumin: 4.1 g/dL (ref 3.5–5.2)
Alkaline Phosphatase: 96 U/L (ref 39–117)
Bilirubin, Direct: 0 mg/dL (ref 0.0–0.3)
Total Bilirubin: 0.3 mg/dL (ref 0.2–1.2)
Total Protein: 6.6 g/dL (ref 6.0–8.3)

## 2023-01-06 NOTE — Telephone Encounter (Signed)
Good news the liver chemistries are normal.  Patient needs to be scheduled for ERCP on September 3 my next available date.  She needs a stent removed.  If she gets yellow jaundice pain or fever before then she needs to go to the emergency room and she will have to be admitted to the hospital to have it done.  She had difficulty getting transportation the last time and I am sorry for that but she is at risk of serious infection with the stent in there and it needs to come out so it is very important that she makes her next ERCP appointment.  She will need diclofenac suppository ordered and Cipro ordered for the procedure  Encounter Diagnoses  Name Primary?   Encounter for removal of biliary stent Yes   Bile leak, postoperative

## 2023-01-08 ENCOUNTER — Other Ambulatory Visit: Payer: Self-pay

## 2023-01-08 DIAGNOSIS — Z4689 Encounter for fitting and adjustment of other specified devices: Secondary | ICD-10-CM

## 2023-01-08 DIAGNOSIS — K9189 Other postprocedural complications and disorders of digestive system: Secondary | ICD-10-CM

## 2023-01-08 NOTE — Telephone Encounter (Signed)
PT is returning call. Please advise 

## 2023-01-08 NOTE — Telephone Encounter (Signed)
Left message for patient to call back. ERCP is scheduled for 03/09/23 at 9:15 am at [facility] with Dr. Leone Payor. Amb ref placed. Need to ensure patient has instructions when she returns call/when reached.

## 2023-01-11 NOTE — Telephone Encounter (Signed)
Left message for pt to call back  °

## 2023-01-11 NOTE — Telephone Encounter (Signed)
Inbound call from patient states she is returning cal. Please  advise.

## 2023-01-11 NOTE — Telephone Encounter (Signed)
Inbound call from patient returning phone call. Please advise, thank you.  

## 2023-01-11 NOTE — Telephone Encounter (Signed)
Patient returning call states she is anxious that it is bad news.

## 2023-01-12 NOTE — Telephone Encounter (Signed)
Pt made aware of recent results and Dr. Leone Payor recommendations. Pt notified of procedure date and time 03/09/2023 at 9:15 with Dr. Leone Payor for ERCP. Instructions were created for pt and sent to pt via mail. Pt made aware.   Pt verbalized understanding with all questions answered.

## 2023-02-25 NOTE — Pre-Procedure Instructions (Signed)
Attempted to obtain medical history. Unable to reach pt. At this time. HIPAA complaint voicemail left with pre-surgical testing number. 

## 2023-03-03 ENCOUNTER — Encounter (HOSPITAL_COMMUNITY): Payer: Self-pay | Admitting: Internal Medicine

## 2023-03-03 NOTE — Progress Notes (Signed)
Attempted to obtain medical history via telephone, unable to reach at this time. HIPAA compliant voicemail message left requesting return call to pre surgical testing department. 

## 2023-03-07 ENCOUNTER — Encounter (HOSPITAL_COMMUNITY): Payer: Self-pay | Admitting: Anesthesiology

## 2023-03-09 ENCOUNTER — Encounter (HOSPITAL_COMMUNITY): Admission: RE | Payer: Self-pay | Source: Home / Self Care

## 2023-03-09 ENCOUNTER — Ambulatory Visit (HOSPITAL_COMMUNITY): Admission: RE | Admit: 2023-03-09 | Payer: 59 | Source: Home / Self Care | Admitting: Internal Medicine

## 2023-03-09 SURGERY — ENDOSCOPIC RETROGRADE CHOLANGIOPANCREATOGRAPHY (ERCP)
Anesthesia: General

## 2023-03-09 MED ORDER — PROPOFOL 10 MG/ML IV BOLUS
INTRAVENOUS | Status: AC
  Filled 2023-03-09: qty 20

## 2023-03-09 MED ORDER — FENTANYL CITRATE (PF) 100 MCG/2ML IJ SOLN
INTRAMUSCULAR | Status: AC
  Filled 2023-03-09: qty 2

## 2023-03-09 MED ORDER — MIDAZOLAM HCL 2 MG/2ML IJ SOLN
INTRAMUSCULAR | Status: AC
  Filled 2023-03-09: qty 2

## 2023-03-09 NOTE — Progress Notes (Signed)
Attempted to contact pt to see if she is coming in today for her procedure.  Left HIPAA appropriate voicemail.

## 2023-03-11 ENCOUNTER — Telehealth: Payer: Self-pay | Admitting: Internal Medicine

## 2023-03-11 NOTE — Telephone Encounter (Signed)
Left message for patient to call back  

## 2023-03-11 NOTE — Telephone Encounter (Signed)
The patient no showed for her ERCP and stent removal again.  She told endoscopy staff she was in [place] waiting for a grandchild to be born and was not coming.  We had previously explained the importance of stent removal to this patient and how there is a risk for serious infection and illness unless it is removed.  She needs to be called and rescheduled for next open date on my hospital schedule.  If she develops jaundice fever or chills she should present to the emergency room in the interim.  Please let me know the date that she is rescheduled for.  You can note in the schedule that is a stent removal so which should be a quicker procedure.

## 2023-03-12 NOTE — Telephone Encounter (Signed)
Left message for patient to call back  

## 2023-03-16 NOTE — Telephone Encounter (Signed)
Left message for pt to call back  °

## 2023-03-17 NOTE — Telephone Encounter (Signed)
Left message for pt to call back  °

## 2023-03-18 NOTE — Telephone Encounter (Signed)
Left message for patient to call back  

## 2023-03-19 NOTE — Telephone Encounter (Signed)
Left message for patent to call back. Multiple messages have been left for patient. Letter mailed home.

## 2023-07-23 ENCOUNTER — Emergency Department (HOSPITAL_COMMUNITY): Payer: 59

## 2023-07-23 ENCOUNTER — Other Ambulatory Visit: Payer: Self-pay

## 2023-07-23 ENCOUNTER — Emergency Department (HOSPITAL_COMMUNITY)
Admission: EM | Admit: 2023-07-23 | Discharge: 2023-07-23 | Discharge status: Against Medical Advice | Payer: 59 | Attending: Emergency Medicine | Admitting: Emergency Medicine

## 2023-07-23 ENCOUNTER — Encounter (HOSPITAL_COMMUNITY): Payer: Self-pay

## 2023-07-23 DIAGNOSIS — R0602 Shortness of breath: Secondary | ICD-10-CM

## 2023-07-23 DIAGNOSIS — Z20822 Contact with and (suspected) exposure to covid-19: Secondary | ICD-10-CM | POA: Insufficient documentation

## 2023-07-23 DIAGNOSIS — F17200 Nicotine dependence, unspecified, uncomplicated: Secondary | ICD-10-CM | POA: Insufficient documentation

## 2023-07-23 DIAGNOSIS — Z5321 Procedure and treatment not carried out due to patient leaving prior to being seen by health care provider: Secondary | ICD-10-CM | POA: Diagnosis not present

## 2023-07-23 DIAGNOSIS — J101 Influenza due to other identified influenza virus with other respiratory manifestations: Secondary | ICD-10-CM | POA: Insufficient documentation

## 2023-07-23 DIAGNOSIS — J449 Chronic obstructive pulmonary disease, unspecified: Secondary | ICD-10-CM | POA: Diagnosis not present

## 2023-07-23 LAB — RESP PANEL BY RT-PCR (RSV, FLU A&B, COVID)  RVPGX2
Influenza A by PCR: POSITIVE — AB
Influenza B by PCR: NEGATIVE
Resp Syncytial Virus by PCR: NEGATIVE
SARS Coronavirus 2 by RT PCR: NEGATIVE

## 2023-07-23 MED ORDER — ALBUTEROL SULFATE HFA 108 (90 BASE) MCG/ACT IN AERS: 2.0000 | INHALATION_SPRAY | RESPIRATORY_TRACT | Status: DC | PRN

## 2023-07-23 NOTE — ED Triage Notes (Signed)
Pt BIB [facility] due to SOB x3days. Pt was 85 % RA upon arrival of EMS and she was put on 3 L. Pt now off O2 at 94%. Hx of COPD. Pt does smoke

## 2023-07-23 NOTE — ED Triage Notes (Signed)
Pt also believs she was exposed to Flu or COVID

## 2023-10-07 ENCOUNTER — Telehealth (HOSPITAL_COMMUNITY): Payer: Self-pay

## 2023-10-07 NOTE — Telephone Encounter (Signed)
 Lvm to confirm 10/11/23 appt by 12pm 10/08/23

## 2023-10-08 NOTE — Telephone Encounter (Signed)
 Called pt to confirm no answer left vm cancelled appt

## 2023-10-11 ENCOUNTER — Ambulatory Visit (HOSPITAL_COMMUNITY): Admitting: Registered Nurse

## 2023-12-01 ENCOUNTER — Other Ambulatory Visit: Payer: Self-pay | Admitting: Internal Medicine

## 2023-12-01 DIAGNOSIS — Z1231 Encounter for screening mammogram for malignant neoplasm of breast: Secondary | ICD-10-CM
